# Patient Record
Sex: Female | Born: 1955 | Race: White | Hispanic: No | Marital: Married | Smoking: Former smoker
Health system: Southern US, Community
[De-identification: ages and names within clinical notes are randomized; demographics above are authoritative.]

## PROBLEM LIST (undated history)

## (undated) DIAGNOSIS — I639 Cerebral infarction, unspecified: Secondary | ICD-10-CM

## (undated) DIAGNOSIS — M810 Age-related osteoporosis without current pathological fracture: Secondary | ICD-10-CM

## (undated) DIAGNOSIS — G4733 Obstructive sleep apnea (adult) (pediatric): Secondary | ICD-10-CM

## (undated) DIAGNOSIS — E785 Hyperlipidemia, unspecified: Secondary | ICD-10-CM

## (undated) DIAGNOSIS — E039 Hypothyroidism, unspecified: Secondary | ICD-10-CM

## (undated) DIAGNOSIS — F419 Anxiety disorder, unspecified: Secondary | ICD-10-CM

## (undated) DIAGNOSIS — H269 Unspecified cataract: Secondary | ICD-10-CM

## (undated) DIAGNOSIS — G4730 Sleep apnea, unspecified: Secondary | ICD-10-CM

## (undated) DIAGNOSIS — E079 Disorder of thyroid, unspecified: Secondary | ICD-10-CM

## (undated) DIAGNOSIS — F32A Depression, unspecified: Secondary | ICD-10-CM

## (undated) DIAGNOSIS — K219 Gastro-esophageal reflux disease without esophagitis: Secondary | ICD-10-CM

## (undated) DIAGNOSIS — I10 Essential (primary) hypertension: Secondary | ICD-10-CM

## (undated) DIAGNOSIS — R413 Other amnesia: Secondary | ICD-10-CM

## (undated) DIAGNOSIS — C50919 Malignant neoplasm of unspecified site of unspecified female breast: Secondary | ICD-10-CM

## (undated) DIAGNOSIS — T7840XA Allergy, unspecified, initial encounter: Secondary | ICD-10-CM

## (undated) HISTORY — DX: Depression, unspecified: F32.A

## (undated) HISTORY — PX: ABDOMINAL HYSTERECTOMY: SHX81

## (undated) HISTORY — DX: Sleep apnea, unspecified: G47.30

## (undated) HISTORY — DX: Obstructive sleep apnea (adult) (pediatric): G47.33

## (undated) HISTORY — PX: WISDOM TOOTH EXTRACTION: SHX21

## (undated) HISTORY — PX: TUBAL LIGATION: SHX77

## (undated) HISTORY — DX: Essential (primary) hypertension: I10

## (undated) HISTORY — DX: Cerebral infarction, unspecified: I63.9

## (undated) HISTORY — PX: BREAST BIOPSY: SHX20

## (undated) HISTORY — PX: THYROIDECTOMY: SHX17

## (undated) HISTORY — PX: BREAST RECONSTRUCTION: SHX9

## (undated) HISTORY — DX: Hypothyroidism, unspecified: E03.9

## (undated) HISTORY — PX: FRACTURE SURGERY: SHX138

## (undated) HISTORY — PX: CHOLECYSTECTOMY: SHX55

## (undated) HISTORY — DX: Hyperlipidemia, unspecified: E78.5

## (undated) HISTORY — DX: Other amnesia: R41.3

## (undated) HISTORY — DX: Age-related osteoporosis without current pathological fracture: M81.0

## (undated) HISTORY — PX: EYE SURGERY: SHX253

## (undated) HISTORY — PX: COLONOSCOPY W/ POLYPECTOMY: SHX1380

## (undated) HISTORY — PX: MASTECTOMY: SHX3

## (undated) HISTORY — DX: Malignant neoplasm of unspecified site of unspecified female breast: C50.919

## (undated) HISTORY — DX: Gastro-esophageal reflux disease without esophagitis: K21.9

## (undated) HISTORY — DX: Anxiety disorder, unspecified: F41.9

## (undated) HISTORY — PX: ANKLE FRACTURE SURGERY: SHX122

## (undated) HISTORY — PX: LYMPH NODE BIOPSY: SHX201

## (undated) HISTORY — PX: CATARACT EXTRACTION: SUR2

## (undated) HISTORY — DX: Allergy, unspecified, initial encounter: T78.40XA

## (undated) HISTORY — DX: Disorder of thyroid, unspecified: E07.9

## (undated) HISTORY — DX: Unspecified cataract: H26.9

---

## 1999-02-14 ENCOUNTER — Inpatient Hospital Stay (HOSPITAL_COMMUNITY): Admission: RE | Admit: 1999-02-14 | Discharge: 1999-02-16 | Payer: Self-pay | Admitting: Obstetrics & Gynecology

## 1999-04-05 ENCOUNTER — Ambulatory Visit (HOSPITAL_COMMUNITY): Admission: RE | Admit: 1999-04-05 | Discharge: 1999-04-05 | Payer: Self-pay | Admitting: Obstetrics & Gynecology

## 1999-04-05 ENCOUNTER — Encounter: Payer: Self-pay | Admitting: Obstetrics & Gynecology

## 1999-06-14 ENCOUNTER — Other Ambulatory Visit: Admission: RE | Admit: 1999-06-14 | Discharge: 1999-06-14 | Payer: Self-pay | Admitting: Gastroenterology

## 1999-06-14 ENCOUNTER — Encounter (INDEPENDENT_AMBULATORY_CARE_PROVIDER_SITE_OTHER): Payer: Self-pay

## 1999-07-02 ENCOUNTER — Other Ambulatory Visit: Admission: RE | Admit: 1999-07-02 | Discharge: 1999-07-02 | Payer: Self-pay | Admitting: Obstetrics & Gynecology

## 1999-08-16 ENCOUNTER — Ambulatory Visit (HOSPITAL_COMMUNITY): Admission: RE | Admit: 1999-08-16 | Discharge: 1999-08-16 | Payer: Self-pay | Admitting: Obstetrics & Gynecology

## 1999-08-16 ENCOUNTER — Encounter: Payer: Self-pay | Admitting: Obstetrics & Gynecology

## 1999-08-21 ENCOUNTER — Encounter: Payer: Self-pay | Admitting: Obstetrics & Gynecology

## 1999-08-21 ENCOUNTER — Ambulatory Visit (HOSPITAL_COMMUNITY): Admission: RE | Admit: 1999-08-21 | Discharge: 1999-08-21 | Payer: Self-pay | Admitting: Obstetrics & Gynecology

## 2000-04-08 ENCOUNTER — Ambulatory Visit (HOSPITAL_COMMUNITY): Admission: RE | Admit: 2000-04-08 | Discharge: 2000-04-08 | Payer: Self-pay | Admitting: *Deleted

## 2000-04-16 ENCOUNTER — Other Ambulatory Visit: Admission: RE | Admit: 2000-04-16 | Discharge: 2000-04-16 | Payer: Self-pay | Admitting: Obstetrics & Gynecology

## 2000-10-19 ENCOUNTER — Ambulatory Visit (HOSPITAL_COMMUNITY): Admission: RE | Admit: 2000-10-19 | Discharge: 2000-10-19 | Payer: Self-pay | Admitting: Obstetrics & Gynecology

## 2000-10-19 ENCOUNTER — Encounter: Payer: Self-pay | Admitting: Obstetrics & Gynecology

## 2001-04-22 ENCOUNTER — Other Ambulatory Visit: Admission: RE | Admit: 2001-04-22 | Discharge: 2001-04-22 | Payer: Self-pay | Admitting: Obstetrics & Gynecology

## 2001-10-29 ENCOUNTER — Ambulatory Visit (HOSPITAL_COMMUNITY): Admission: RE | Admit: 2001-10-29 | Discharge: 2001-10-29 | Payer: Self-pay | Admitting: Obstetrics & Gynecology

## 2001-10-29 ENCOUNTER — Encounter: Payer: Self-pay | Admitting: Obstetrics & Gynecology

## 2001-12-07 ENCOUNTER — Ambulatory Visit (HOSPITAL_COMMUNITY): Admission: RE | Admit: 2001-12-07 | Discharge: 2001-12-07 | Payer: Self-pay

## 2001-12-07 ENCOUNTER — Encounter (INDEPENDENT_AMBULATORY_CARE_PROVIDER_SITE_OTHER): Payer: Self-pay

## 2001-12-07 ENCOUNTER — Observation Stay (HOSPITAL_COMMUNITY): Admission: RE | Admit: 2001-12-07 | Discharge: 2001-12-07 | Payer: Self-pay

## 2002-06-21 ENCOUNTER — Other Ambulatory Visit: Admission: RE | Admit: 2002-06-21 | Discharge: 2002-06-21 | Payer: Self-pay | Admitting: Obstetrics & Gynecology

## 2002-08-11 ENCOUNTER — Encounter: Payer: Self-pay | Admitting: Obstetrics & Gynecology

## 2002-08-11 ENCOUNTER — Ambulatory Visit (HOSPITAL_COMMUNITY): Admission: RE | Admit: 2002-08-11 | Discharge: 2002-08-11 | Payer: Self-pay | Admitting: Obstetrics & Gynecology

## 2002-12-20 ENCOUNTER — Ambulatory Visit (HOSPITAL_COMMUNITY): Admission: RE | Admit: 2002-12-20 | Discharge: 2002-12-20 | Payer: Self-pay | Admitting: Obstetrics & Gynecology

## 2002-12-20 ENCOUNTER — Encounter: Payer: Self-pay | Admitting: Obstetrics & Gynecology

## 2003-07-26 ENCOUNTER — Other Ambulatory Visit: Admission: RE | Admit: 2003-07-26 | Discharge: 2003-07-26 | Payer: Self-pay | Admitting: Obstetrics & Gynecology

## 2003-12-26 ENCOUNTER — Ambulatory Visit (HOSPITAL_COMMUNITY): Admission: RE | Admit: 2003-12-26 | Discharge: 2003-12-26 | Payer: Self-pay | Admitting: Obstetrics and Gynecology

## 2004-09-11 ENCOUNTER — Other Ambulatory Visit: Admission: RE | Admit: 2004-09-11 | Discharge: 2004-09-11 | Payer: Self-pay | Admitting: Obstetrics & Gynecology

## 2005-02-25 ENCOUNTER — Ambulatory Visit (HOSPITAL_COMMUNITY): Admission: RE | Admit: 2005-02-25 | Discharge: 2005-02-25 | Payer: Self-pay | Admitting: Obstetrics & Gynecology

## 2005-03-05 ENCOUNTER — Encounter (INDEPENDENT_AMBULATORY_CARE_PROVIDER_SITE_OTHER): Payer: Self-pay | Admitting: Diagnostic Radiology

## 2005-03-05 ENCOUNTER — Encounter: Admission: RE | Admit: 2005-03-05 | Discharge: 2005-03-05 | Payer: Self-pay | Admitting: Obstetrics & Gynecology

## 2005-03-05 ENCOUNTER — Encounter (INDEPENDENT_AMBULATORY_CARE_PROVIDER_SITE_OTHER): Payer: Self-pay | Admitting: *Deleted

## 2005-03-06 ENCOUNTER — Encounter (INDEPENDENT_AMBULATORY_CARE_PROVIDER_SITE_OTHER): Payer: Self-pay | Admitting: *Deleted

## 2005-03-06 ENCOUNTER — Encounter: Admission: RE | Admit: 2005-03-06 | Discharge: 2005-03-06 | Payer: Self-pay | Admitting: Obstetrics & Gynecology

## 2005-03-15 ENCOUNTER — Encounter: Admission: RE | Admit: 2005-03-15 | Discharge: 2005-03-15 | Payer: Self-pay

## 2005-03-17 ENCOUNTER — Encounter: Admission: RE | Admit: 2005-03-17 | Discharge: 2005-03-17 | Payer: Self-pay

## 2005-03-20 ENCOUNTER — Ambulatory Visit (HOSPITAL_BASED_OUTPATIENT_CLINIC_OR_DEPARTMENT_OTHER): Admission: RE | Admit: 2005-03-20 | Discharge: 2005-03-20 | Payer: Self-pay

## 2005-03-20 ENCOUNTER — Encounter (INDEPENDENT_AMBULATORY_CARE_PROVIDER_SITE_OTHER): Payer: Self-pay

## 2005-03-20 ENCOUNTER — Encounter (INDEPENDENT_AMBULATORY_CARE_PROVIDER_SITE_OTHER): Payer: Self-pay | Admitting: Specialist

## 2005-03-20 ENCOUNTER — Encounter: Admission: RE | Admit: 2005-03-20 | Discharge: 2005-03-20 | Payer: Self-pay

## 2005-03-20 ENCOUNTER — Ambulatory Visit (HOSPITAL_COMMUNITY): Admission: RE | Admit: 2005-03-20 | Discharge: 2005-03-20 | Payer: Self-pay

## 2005-04-01 ENCOUNTER — Ambulatory Visit: Payer: Self-pay | Admitting: Oncology

## 2005-04-08 ENCOUNTER — Ambulatory Visit: Admission: RE | Admit: 2005-04-08 | Discharge: 2005-06-20 | Payer: Self-pay | Admitting: Radiation Oncology

## 2005-05-29 ENCOUNTER — Ambulatory Visit (HOSPITAL_COMMUNITY): Admission: RE | Admit: 2005-05-29 | Discharge: 2005-05-29 | Payer: Self-pay | Admitting: Radiation Oncology

## 2005-07-22 ENCOUNTER — Ambulatory Visit: Payer: Self-pay | Admitting: Gastroenterology

## 2005-07-29 ENCOUNTER — Ambulatory Visit (HOSPITAL_COMMUNITY): Admission: RE | Admit: 2005-07-29 | Discharge: 2005-07-29 | Payer: Self-pay | Admitting: Gastroenterology

## 2005-07-30 ENCOUNTER — Ambulatory Visit: Payer: Self-pay | Admitting: Oncology

## 2005-08-07 ENCOUNTER — Ambulatory Visit: Payer: Self-pay | Admitting: Gastroenterology

## 2005-09-30 ENCOUNTER — Ambulatory Visit: Payer: Self-pay | Admitting: Oncology

## 2005-10-21 ENCOUNTER — Other Ambulatory Visit: Admission: RE | Admit: 2005-10-21 | Discharge: 2005-10-21 | Payer: Self-pay | Admitting: Obstetrics & Gynecology

## 2005-10-28 ENCOUNTER — Ambulatory Visit: Payer: Self-pay | Admitting: Gastroenterology

## 2006-01-03 ENCOUNTER — Encounter: Admission: RE | Admit: 2006-01-03 | Discharge: 2006-01-03 | Payer: Self-pay | Admitting: Obstetrics & Gynecology

## 2006-01-05 ENCOUNTER — Encounter: Admission: RE | Admit: 2006-01-05 | Discharge: 2006-01-05 | Payer: Self-pay | Admitting: Plastic Surgery

## 2006-07-10 ENCOUNTER — Ambulatory Visit: Payer: Self-pay | Admitting: Oncology

## 2006-11-19 ENCOUNTER — Encounter: Payer: Self-pay | Admitting: Vascular Surgery

## 2006-11-19 ENCOUNTER — Ambulatory Visit: Admission: RE | Admit: 2006-11-19 | Discharge: 2006-11-19 | Payer: Self-pay | Admitting: *Deleted

## 2006-11-19 ENCOUNTER — Ambulatory Visit: Payer: Self-pay | Admitting: Vascular Surgery

## 2007-01-07 ENCOUNTER — Encounter: Admission: RE | Admit: 2007-01-07 | Discharge: 2007-01-07 | Payer: Self-pay | Admitting: Obstetrics & Gynecology

## 2008-02-03 ENCOUNTER — Encounter: Admission: RE | Admit: 2008-02-03 | Discharge: 2008-02-03 | Payer: Self-pay | Admitting: Obstetrics & Gynecology

## 2010-10-13 ENCOUNTER — Encounter: Payer: Self-pay | Admitting: Obstetrics & Gynecology

## 2011-02-07 NOTE — Op Note (Signed)
[facility]  Patient:    Haley Beasley, Haley Beasley Visit Number: [id number] MRN: [id number]          Service Type: SUR Location: 4W [id number] Attending Physician:  Meredith Leeds Dictated by:   Zigmund Daniel, M.D. Proc. Date: 12/07/01 Admit Date:  12/07/2001                             Operative Report  PREOPERATIVE DIAGNOSIS:  Subacute cholecystitis and cholelithiasis.  POSTOPERATIVE DIAGNOSIS:  Subacute cholecystitis and cholelithiasis.  OPERATION PERFORMED:  Laparoscopic cholecystectomy.  SURGEON:  Zigmund Daniel, M.D.  ANESTHESIA:  General.  DESCRIPTION OF PROCEDURE:  After the patient was monitored and anesthetized and had routine preparation and draping of the abdomen, I infused local anesthetic inferior to the umbilicus and made a transverse incision at the site of a previous laparoscopy incision and dissected down through the scar and incised the fascia longitudinally, opened the peritoneum bluntly put in a 0 Vicryl pursestring suture in the fascia and secured a Hasson cannula.  After inflating the abdomen with CO2 I examined the peritoneal contents and saw some scars in the lower abdomen but otherwise, no abnormalities except for a distended slightly edematous gallbladder consistent with the patients clinical history.  After anesthetizing the sites, I placed a 10 mm epigastric port and two 5 mm right subcostal ports, then positioned the patient head up, foot down and tilted to the left and grasped the fundus of the gallbladder and elevated it toward the right shoulder.  There were some adhesions of the duodenum and omentum to the undersurface of the gallbladder and I carefully took those down.  I then retracted the infundibulum of the right and dissected out the cystic duct and clearly saw it emerging from the infundibulum of the gallbladder and joining the common bile duct.  I clipped the cystic duct with four clips and cut between  the two which were closest to the gallbladder. Then I dissected out the cystic artery, clipped it with three clips and cut between the two closest to the gallbladder.  I then used the cautery to dissect the gallbladder away from the liver and get hemostasis in the gallbladder fossa.  After  I had it almost completely detached, I irrigated the area.  I removed the irrigant and assured that the clips were secure and that hemostasis was good.  I then detached the gallbladder from the liver and removed it through the umbilical incision and tied the pursestring suture.  I then irrigated the right upper quadrant again until it was clear, removing the irrigant as far as possible.  I removed the lateral ports under direct vision, then allowed the CO2 to escape and removed the epigastric port.  I closed all the skin incisions with intracuticular 4-0 Vicryl and Steri-Strips and applied bandages.  Sponge, needle and instrument counts were correct.  The patient was stable throughout the operation. Dictated by:   Zigmund Daniel, M.D. Attending Physician:  Meredith Leeds DD:  12/07/01 TD:  12/07/01 Job: [id number]

## 2011-02-07 NOTE — Op Note (Signed)
Haley Beasley, Haley Beasley NO.:  [id number]   MEDICAL RECORD NO.:  [id number]          PATIENT TYPE:  AMB   LOCATION:  DSC                          FACILITY:  [facility]   PHYSICIAN:  Lorre Munroe., M.D.DATE OF BIRTH:  [birth year]   DATE OF PROCEDURE:  03/20/2005  DATE OF DISCHARGE:                                 OPERATIVE REPORT   PRE AND POSTOPERATIVE DIAGNOSIS:  Carcinoma of the left breast.   OPERATION:  Left partial mastectomy with sentinel lymph node biopsy after  injection of Lymphazurin blue dye.   SURGEON:  Lebron Conners, M.D.   ANESTHESIA:  General.   PROCEDURE:  After the patient was monitored and anesthetized, injected about  3 mL of Lymphazurin blue in the dermis and other subareolar tissues of the  left breast.  That was massaged for several minutes. After routine  preparation and draping of the left breast and axilla I used the radiation  detector and found a hot spot in the left axilla consistent with a sentinel  lymph node. I made a small incision right over that and dissected toward it  and found that there was indeed a highly radioactive blue stained lymph node  present. After excision of that node, it was confirmed to have increased  radioactivity in it and the remainder of the tissue and the axilla had  background level of activity. I closed that incision with intracuticular 4-0  Vicryl and Steri-Strips. The report the Touch Preps of the lymph node were  that they were negative for cancer. I then turned attention to the breast  and made a circumareolar incision paralleling the areolar border at the site  of the wire which was used to mark the superficial limit of the bracketing  wires for the lumpectomy. Dissecting in all directions and out toward the  wire which marked the deep extent, I took that generous resection in all  directions with my dissection being just under the areola and skin of the  breast and then just at or superficial to and  in some cases including the  pectoral fascia with 3 to 4 cm of tissue excised in the cephalad and caudad  directions as well. After removal of that specimen. I sent it for an x-ray  and it was there was some difficulty because of the superficial wire had  come out but the calcifications which were in question were present and the  marker which had been used at the time of biopsy was present as well. Touch  Preps were obtained on that specimen and were negative as well. I did see a  little bit of slightly suspicious tissue anteriorly and cephalad and I  excised that and sent it as a specimen as well. The wound hemostasis was  good after cautery and irrigation. I closed the skin with intracuticular 4-0  Vicryl and Steri-Strips and applied bandages. She went to PACU in stable  condition.       WB/MEDQ  D:  03/20/2005  T:  03/20/2005  Job:  [id number]

## 2020-01-21 HISTORY — PX: ENDOVENOUS ABLATION SAPHENOUS VEIN W/ LASER: SUR449

## 2020-05-23 HISTORY — PX: ENDOVENOUS ABLATION SAPHENOUS VEIN W/ LASER: SUR449

## 2021-10-22 DIAGNOSIS — E039 Hypothyroidism, unspecified: Secondary | ICD-10-CM | POA: Diagnosis not present

## 2021-10-22 DIAGNOSIS — I10 Essential (primary) hypertension: Secondary | ICD-10-CM | POA: Diagnosis not present

## 2021-10-22 DIAGNOSIS — M8580 Other specified disorders of bone density and structure, unspecified site: Secondary | ICD-10-CM | POA: Diagnosis not present

## 2021-10-22 DIAGNOSIS — G4730 Sleep apnea, unspecified: Secondary | ICD-10-CM | POA: Diagnosis not present

## 2021-10-22 DIAGNOSIS — K219 Gastro-esophageal reflux disease without esophagitis: Secondary | ICD-10-CM | POA: Diagnosis not present

## 2021-10-22 DIAGNOSIS — E782 Mixed hyperlipidemia: Secondary | ICD-10-CM | POA: Diagnosis not present

## 2021-10-22 DIAGNOSIS — Z131 Encounter for screening for diabetes mellitus: Secondary | ICD-10-CM | POA: Diagnosis not present

## 2021-10-22 DIAGNOSIS — Z1321 Encounter for screening for nutritional disorder: Secondary | ICD-10-CM | POA: Diagnosis not present

## 2021-10-22 DIAGNOSIS — Z6828 Body mass index (BMI) 28.0-28.9, adult: Secondary | ICD-10-CM | POA: Diagnosis not present

## 2021-10-22 DIAGNOSIS — Z1331 Encounter for screening for depression: Secondary | ICD-10-CM | POA: Diagnosis not present

## 2021-10-22 DIAGNOSIS — Z0000 Encounter for general adult medical examination without abnormal findings: Secondary | ICD-10-CM | POA: Diagnosis not present

## 2021-10-23 DIAGNOSIS — Z9889 Other specified postprocedural states: Secondary | ICD-10-CM | POA: Diagnosis not present

## 2021-10-23 DIAGNOSIS — Z853 Personal history of malignant neoplasm of breast: Secondary | ICD-10-CM | POA: Diagnosis not present

## 2021-10-30 DIAGNOSIS — E039 Hypothyroidism, unspecified: Secondary | ICD-10-CM | POA: Diagnosis not present

## 2021-11-07 DIAGNOSIS — J310 Chronic rhinitis: Secondary | ICD-10-CM | POA: Diagnosis not present

## 2021-11-07 DIAGNOSIS — H6123 Impacted cerumen, bilateral: Secondary | ICD-10-CM | POA: Diagnosis not present

## 2021-11-12 DIAGNOSIS — R0600 Dyspnea, unspecified: Secondary | ICD-10-CM | POA: Diagnosis not present

## 2021-11-12 DIAGNOSIS — G4733 Obstructive sleep apnea (adult) (pediatric): Secondary | ICD-10-CM | POA: Diagnosis not present

## 2021-11-12 DIAGNOSIS — Z87891 Personal history of nicotine dependence: Secondary | ICD-10-CM | POA: Diagnosis not present

## 2021-12-10 DIAGNOSIS — R0600 Dyspnea, unspecified: Secondary | ICD-10-CM | POA: Diagnosis not present

## 2021-12-10 DIAGNOSIS — J019 Acute sinusitis, unspecified: Secondary | ICD-10-CM | POA: Diagnosis not present

## 2021-12-10 DIAGNOSIS — G4733 Obstructive sleep apnea (adult) (pediatric): Secondary | ICD-10-CM | POA: Diagnosis not present

## 2021-12-10 DIAGNOSIS — Z87891 Personal history of nicotine dependence: Secondary | ICD-10-CM | POA: Diagnosis not present

## 2021-12-13 ENCOUNTER — Ambulatory Visit (INDEPENDENT_AMBULATORY_CARE_PROVIDER_SITE_OTHER): Payer: Medicare HMO | Admitting: Gastroenterology

## 2021-12-13 ENCOUNTER — Encounter: Payer: Self-pay | Admitting: Gastroenterology

## 2021-12-13 ENCOUNTER — Other Ambulatory Visit: Payer: Self-pay

## 2021-12-13 VITALS — BP 110/70 | HR 73 | Ht 68.0 in | Wt 192.0 lb

## 2021-12-13 DIAGNOSIS — R194 Change in bowel habit: Secondary | ICD-10-CM

## 2021-12-13 DIAGNOSIS — R1319 Other dysphagia: Secondary | ICD-10-CM | POA: Diagnosis not present

## 2021-12-13 DIAGNOSIS — Z860100 Personal history of colon polyps, unspecified: Secondary | ICD-10-CM

## 2021-12-13 DIAGNOSIS — K219 Gastro-esophageal reflux disease without esophagitis: Secondary | ICD-10-CM | POA: Diagnosis not present

## 2021-12-13 DIAGNOSIS — Z800 Family history of malignant neoplasm of digestive organs: Secondary | ICD-10-CM | POA: Diagnosis not present

## 2021-12-13 MED ORDER — PREDNISONE 10 MG PO TABS: 10.0000 mg | ORAL_TABLET | ORAL | 0 refills | Status: DC

## 2021-12-13 MED ORDER — CLENPIQ 10-3.5-12 MG-GM -GM/160ML PO SOLN
1.0000 | Freq: Once | ORAL | 0 refills | Status: AC
Start: 2021-12-13 — End: 2021-12-13

## 2021-12-13 NOTE — Progress Notes (Signed)
? ? ?          ?Chief Complaint: Family history of colon cancer, colon cancer screening, GERD, family history of esophageal cancer ? ?Referring Provider:     Jaclynn Major, NP ? ? ?HPI:   ? ? ?Haley Beasley is a 66 y.o. female with a history of hypothyroidism, HTN, HLD, GERD, depression, history of breast cancer 2006 s/p lumpectomy and radiation then recurrence in 2009 s/p bilateral mastectomy and chemotherapy in 2009, osteopenia, OSA (on CPAP), referred to the Gastroenterology Clinic for evaluation of ongoing colon cancer screening and evaluation of GERD. ? ?Long standing hx of GERD with index sxs of HB, regurgitation. Occasional dysphagia, poiting to suprasternal notch. Reflux incompletely controlled with pantoprazole 40 mg/day. Will have breakthrough, which she treats with Tums or extra Protonix prn a few days/week. Regurgitation with forward flexion. Last EGD ~2008.  ? ?Last colonoscopy was 2017 in [facility] and n/f polyps.  No record available for review, but was recommended to repeat in 5 years. Has had polyps in the past. No current LGI sxs.  ? ?As above, personal history of breast cancer and significant family history of malignancy as outlined below.  She has never had genetic testing. ? ?Family history notable for the following: ?- Mother diagnosed with colon cancer at age 73 ?- Brother diagnosed with esophageal cancer in his 41s, along with RCC ?- Brother with Pancreatic CA, deceased ~71 yo ?- Brothers x4 with Lung CA ?- 16 total siblings ? ?Reviewed most recent labs from [facility] dated 10/22/2021: ?- Normal CBC with H/H 12.6/36.9 ?- Normal CMP ?- TSH 0.27 ?- Vitamin D 22.3 ?- A1c 5.9% ? ?Endoscopic History: ?- EGD (05/1999): Scattered gastritis, mild duodenitis, hiatal hernia ?- Colonoscopy (05/1999): Normal ?- EGD (07/2003): Irregular Z-line with some nodularity (path: Reflux changes without intestinal metaplasia), Hiatal hernia ?- Colonoscopy (07/2003): Normal ?- EGD ~2008 in [place] ?- Last colonoscopy 2017  in [place] and notable for polyps ? ? ?Past Medical History:  ?Diagnosis Date  ? Breast cancer ([place])   ? GERD (gastroesophageal reflux disease)   ? Hypertension   ? Thyroid disease   ? ? ? ?Past Surgical History:  ?Procedure Laterality Date  ? ABDOMINAL HYSTERECTOMY    ? CHOLECYSTECTOMY    ? MASTECTOMY Bilateral   ? ?Family History  ?Problem Relation Age of Onset  ? Colon cancer Mother   ? Esophageal cancer Brother   ? Renal cancer Brother   ? Brain cancer Maternal Uncle   ? Renal cancer Paternal Uncle   ? ?  ?Current Outpatient Medications  ?Medication Sig Dispense Refill  ? ezetimibe (ZETIA) 10 MG tablet ezetimibe 10 mg tablet    ? fluticasone (FLONASE) 50 MCG/ACT nasal spray     ? ketoconazole (NIZORAL) 2 % cream ketoconazole 2 % topical cream    ? losartan (COZAAR) 25 MG tablet losartan 25 mg tablet    ? pantoprazole (PROTONIX) 40 MG tablet pantoprazole 40 mg tablet,delayed release    ? predniSONE (DELTASONE) 10 MG tablet Take 1 tablet (10 mg total) by mouth as directed. We have sent the following medications to your pharmacy for you to pick up at your convenience: ?Prednisone '10mg'$  tablet ?'40mg'$  by mouth x 2 week. ?'30mg'$  by mouth x 1 week. ?'20mg'$  by mouth x 1 weeks. ?'10mg'$  by mouth x 1 weeks. 100 tablet 0  ? SYNTHROID 50 MCG tablet     ? ?No current facility-administered medications for this visit.  ? ?Allergies  ?Allergen Reactions  ?  Fluogen [Influenza Virus Vaccine]   ? Penicillins   ? ? ? ?Review of Systems: ?All systems reviewed and negative except where noted in HPI.  ? ? ? ?Physical Exam:   ? ?Wt Readings from Last 3 Encounters:  ?12/13/21 192 lb (87.1 kg)  ? ? ?BP 110/70   Pulse 73   Ht '5\' 8"'$  (1.727 m)   Wt 192 lb (87.1 kg)   BMI 29.19 kg/m?  ?Constitutional:  Pleasant, in no acute distress. ?Psychiatric: Normal mood and affect. Behavior is normal. ?Pain cardiovascular: Normal rate, regular rhythm. No edema ?Pulmonary/chest: Effort normal and breath sounds normal. No wheezing, rales or  rhonchi. ?Abdominal: Soft, nondistended, nontender. Bowel sounds active throughout. There are no masses palpable. No hepatomegaly. ?Neurological: Alert and oriented to person place and time. ?Skin: Skin is warm and dry. No rashes noted. ? ? ?ASSESSMENT AND PLAN;  ? ?1) GERD ?- EGD to evaluate for erosive esophagitis, LES laxity, hiatal hernia ?- Continue pantoprazole for now, with low threshold to change therapy based on endoscopic findings ?- Continue antireflux lifestyle/dietary modifications ?- Barrett's esophagus screening given longstanding history of GERD and family history of esophageal CA ? ?2) Dysphagia ?- Intermittent solid food dysphagia ?- Evaluate for etiology at time of EGD with biopsies and dilation as appropriate ? ?3) Family history of colon cancer ?4) Personal history of colon polyps ?- Colonoscopy for ongoing polyp surveillance ? ?5) Personal history of breast cancer ?6) Family history of esophageal cancer ?7) Family history of pancreatic cancer ?8) Family history of RCC ?- Referral to the genetics clinic ?- Did discuss Pancreatic Cancer screening protocol depending on results from genetic testing ?- Evaluate for Barrett's Esophagus with EGD as above ? ?9) Alternating bowel habits ?- Longstanding history of alternating loose stools and constipation ?- Evaluate for etiology at time of upper endoscopy and colonoscopy as above ?- Extended 2-day prep with Dulcolax, clears, followed by prep ?- Would benefit from fiber supplement ?- Additional recommendations pending endoscopic findings ? ?The indications, risks, and benefits of EGD and colonoscopy were explained to the patient in detail. Risks include but are not limited to bleeding, perforation, adverse reaction to medications, and cardiopulmonary compromise. Sequelae include but are not limited to the possibility of surgery, hospitalization, and mortality. The patient verbalized understanding and wished to proceed. All questions answered, referred to  scheduler and bowel prep ordered. Further recommendations pending results of the exam.  ? ? ? ?Lavena Bullion, DO, FACG  12/13/2021, 3:59 PM ? ? ?Jaclynn Major, NP ? ?

## 2021-12-13 NOTE — Progress Notes (Signed)
ferr

## 2021-12-13 NOTE — Patient Instructions (Addendum)
If you are age 67 or older, your body mass index should be between 23-30. Your Body mass index is 29.19 kg/m?Marland Kitchen If this is out of the aforementioned range listed, please consider follow up with your Primary Care Provider. ? ?If you are age 32 or younger, your body mass index should be between 19-25. Your Body mass index is 29.19 kg/m?Marland Kitchen If this is out of the aformentioned range listed, please consider follow up with your Primary Care Provider.  ? ?________________________________________________________ ? ?The [facility] providers would like to encourage you to use [facility] to communicate with providers for non-urgent requests or questions.  Due to long hold times on the telephone, sending your provider a message by [facility] may be a faster and more efficient way to get a response.  Please allow 48 business hours for a response.  Please remember that this is for non-urgent requests.  ?_______________________________________________________ ? ?You have been scheduled for a colonoscopy. Please follow written instructions given to you at your visit today.  ?Please pick up your prep supplies at the pharmacy within the next 1-3 days. ?If you use inhalers (even only as needed), please bring them with you on the day of your procedure. ? ?We have sent the following medications to your pharmacy for you to pick up at your convenience: ?Clenpiq ? ?A referral has been sent to Genetics. Please call in 2 weeks if you haven't heard anything. ? ?It was a pleasure to see you today! ? ?Gerrit Heck, D.O. ? ? ? ? ? ? ? ?We want to thank you for trusting [facility] with your care. All of our staff and providers value the relationships we have built with our patients, and it is an honor to care for you.  ? ?We are writing to let you know that [facility] will close on Feb 03, 2022, and we invite you to continue to see Dr. Carmell Austria and Gerrit Heck at the [facility] [place]  office location. We are consolidating our serices at these [facility] practices to better provide care. Our office staff will work with you to ensure a seamless transition.  ? ?Gerrit Heck, DO -Dr. Bryan Lemma will be movig to [facility] at [address] [place], [place] [address], effective Feb 03, 2022.  Contact [phone] to schedule an appointment with him.  ? ?Carmell Austria, MD- Dr. Lyndel Safe will be movig to [facility] at [address] [place], [place] [address], effective Feb 03, 2022.  Contact [phone] to schedule an appointment with him.  ? ?Requesting Medical Records ?If you need to request your medical records, please follow the instructions below. Your medical records are confidential, and a copy can be transferred to another provider or released to you or another person you designate only with your permission. ? ?There are several ways to request your medical records: ?Requests for medical records can be submitted through our practice.   ?You can also request your records electronically, in your MyChart account by selecting the ?Request Health Records? tab.  ?If you need additional information on how to request records, please go to [email or web address]/, choose Patient Information, then select Request Medical Records. ?To make an appointment or if you have any questions about your health care needs, please contact our office at [phone] and one of our staff members will be glad to assist you. ?[facility] is committed to providing exceptional care for you and our community. Thank you for allowing [place] to  serve your health care needs. ?Sincerely, ? ?Windy Canny, Director [facility] ?[facility] also offers convenient virtual care options. Sore throat? Sinus problems? Cold or flu symptoms? Get care from the comfort of home with [facility] Video Visits and e-Visits. Learn more about the non-emergency conditions treated and start your virtual visit at  [email or web address] ? ?

## 2021-12-16 ENCOUNTER — Telehealth: Payer: Self-pay | Admitting: Licensed Clinical Social Worker

## 2021-12-16 ENCOUNTER — Encounter: Payer: Self-pay | Admitting: Gastroenterology

## 2021-12-16 NOTE — Telephone Encounter (Signed)
Scheduled appt per 3/24 referral. Pt is aware of appt date and time. Pt is aware to arrive 15 mins prior to appt time and to bring and updated insurance card. Pt is aware of appt location.   ?

## 2021-12-24 DIAGNOSIS — E039 Hypothyroidism, unspecified: Secondary | ICD-10-CM | POA: Diagnosis not present

## 2021-12-25 DIAGNOSIS — R0600 Dyspnea, unspecified: Secondary | ICD-10-CM | POA: Diagnosis not present

## 2021-12-25 DIAGNOSIS — G4733 Obstructive sleep apnea (adult) (pediatric): Secondary | ICD-10-CM | POA: Diagnosis not present

## 2021-12-25 DIAGNOSIS — J301 Allergic rhinitis due to pollen: Secondary | ICD-10-CM | POA: Diagnosis not present

## 2021-12-25 DIAGNOSIS — Z87891 Personal history of nicotine dependence: Secondary | ICD-10-CM | POA: Diagnosis not present

## 2022-01-09 ENCOUNTER — Encounter: Payer: PRIVATE HEALTH INSURANCE | Admitting: Gastroenterology

## 2022-01-14 ENCOUNTER — Encounter: Payer: Self-pay | Admitting: Gastroenterology

## 2022-01-14 ENCOUNTER — Ambulatory Visit (AMBULATORY_SURGERY_CENTER): Payer: Medicare HMO | Admitting: Gastroenterology

## 2022-01-14 VITALS — BP 138/62 | HR 59 | Temp 97.3°F | Resp 14 | Ht 68.0 in | Wt 192.0 lb

## 2022-01-14 DIAGNOSIS — K317 Polyp of stomach and duodenum: Secondary | ICD-10-CM | POA: Diagnosis not present

## 2022-01-14 DIAGNOSIS — Z86010 Personal history of colonic polyps: Secondary | ICD-10-CM

## 2022-01-14 DIAGNOSIS — K219 Gastro-esophageal reflux disease without esophagitis: Secondary | ICD-10-CM

## 2022-01-14 DIAGNOSIS — K5730 Diverticulosis of large intestine without perforation or abscess without bleeding: Secondary | ICD-10-CM

## 2022-01-14 DIAGNOSIS — K621 Rectal polyp: Secondary | ICD-10-CM | POA: Diagnosis not present

## 2022-01-14 DIAGNOSIS — Z09 Encounter for follow-up examination after completed treatment for conditions other than malignant neoplasm: Secondary | ICD-10-CM

## 2022-01-14 DIAGNOSIS — Z800 Family history of malignant neoplasm of digestive organs: Secondary | ICD-10-CM

## 2022-01-14 DIAGNOSIS — R1319 Other dysphagia: Secondary | ICD-10-CM | POA: Diagnosis not present

## 2022-01-14 MED ORDER — SODIUM CHLORIDE 0.9 % IV SOLN: 500.0000 mL | Freq: Once | INTRAVENOUS | Status: DC

## 2022-01-14 NOTE — Progress Notes (Signed)
Called to room to assist during endoscopic procedure.  Patient ID and intended procedure confirmed with present staff. Received instructions for my participation in the procedure from the performing physician.  

## 2022-01-14 NOTE — Op Note (Signed)
[facility] ?Patient Name: Haley Beasley ?Procedure Date: 01/14/2022 1:27 PM ?MRN: [id number] ?Endoscopist: Gerrit Heck , MD ?Age: 66 ?Referring MD:  ?Date of Birth: 1956-04-26 ?Gender: Female ?Account #: [id number] ?Procedure:                Upper GI endoscopy ?Indications:              Dysphagia, Heartburn, Esophageal reflux, Screening  ?                          for Barrett's esophagus ?Medicines:                Monitored Anesthesia Care ?Procedure:                Pre-Anesthesia Assessment: ?                          - Prior to the procedure, a History and Physical  ?                          was performed, and patient medications and  ?                          allergies were reviewed. The patient's tolerance of  ?                          previous anesthesia was also reviewed. The risks  ?                          and benefits of the procedure and the sedation  ?                          options and risks were discussed with the patient.  ?                          All questions were answered, and informed consent  ?                          was obtained. Prior Anticoagulants: The patient has  ?                          taken no previous anticoagulant or antiplatelet  ?                          agents. ASA Grade Assessment: II - A patient with  ?                          mild systemic disease. After reviewing the risks  ?                          and benefits, the patient was deemed in  ?                          satisfactory condition to undergo the procedure. ?  After obtaining informed consent, the endoscope was  ?                          passed under direct vision. Throughout the  ?                          procedure, the patient's blood pressure, pulse, and  ?                          oxygen saturations were monitored continuously. The  ?                          GIF [id number] #[id number] was introduced through the  ?                          mouth, and advanced to the second  part of duodenum.  ?                          The upper GI endoscopy was accomplished without  ?                          difficulty. The patient tolerated the procedure  ?                          well. ?Scope In: ?Scope Out: ?Findings:                 A single area of ectopic gastric mucosa was found  ?                          in the upper third of the esophagus. ?                          The examined esophagus was otherwise normal. Given  ?                          the symptoms of dysphagia, the decision was made to  ?                          perform esophageal dilation. The scope was  ?                          withdrawn. Dilation was performed with a Venia Minks  ?                          dilator with no resistance at 75 Fr. The dilation  ?                          site was examined following endoscope reinsertion  ?                          and showed no bleeding, mucosal tear or  ?  perforation. Estimated blood loss: none. ?                          The Z-line was regular and was found 38 cm from the  ?                          incisors. ?                          The gastroesophageal flap valve was visualized  ?                          endoscopically and classified as Hill Grade II  ?                          (fold present, opens with respiration). ?                          A few small sessile polyps with no bleeding and no  ?                          stigmata of recent bleeding were found in the  ?                          gastric fundus and in the gastric body. Several of  ?                          these polyps were removed with a cold biopsy  ?                          forceps for histologic representative evaluation.  ?                          Resection and retrieval were complete. Estimated  ?                          blood loss was minimal. ?                          The examined duodenum was normal. ?Complications:            No immediate complications. ?Estimated Blood  Loss:     Estimated blood loss was minimal. ?Impression:               - Ectopic gastric mucosa in the upper third of the  ?                          esophagus. ?                          - Normal esophagus. Dilated with 54 Fr Maloney. ?                          - Z-line regular, 38 cm from the incisors. ?                          -  Gastroesophageal flap valve classified as Hill  ?                          Grade II (fold present, opens with respiration). ?                          - A few gastric polyps. Resected and retrieved. ?                          - Normal examined duodenum. ?Recommendation:           - Patient has a contact number available for  ?                          emergencies. The signs and symptoms of potential  ?                          delayed complications were discussed with the  ?                          patient. Return to normal activities tomorrow.  ?                          Written discharge instructions were provided to the  ?                          patient. ?                          - Resume previous diet. ?                          - Continue present medications. ?                          - Await pathology results. ?                          - Repeat upper endoscopy PRN. ?                          - Perform a colonoscopy today. ?                          - Return to GI clinic PRN. ?Gerrit Heck, MD ?01/14/2022 2:06:15 PM ?

## 2022-01-14 NOTE — Progress Notes (Signed)
? ?GASTROENTEROLOGY PROCEDURE H&P NOTE  ? ?Primary Care Physician: ?Garwin Brothers, MD ? ? ? ?Reason for Procedure:   GERD, dysphagia, regurgitation, colon polyp surveillance, family hx of colon cancer, family history of esophageal cancer, family history of pancreatic cancer ? ?Plan:    EGD, colonoscopy ? ?Patient is appropriate for endoscopic procedure(s) in the ambulatory ([facility]) setting. ? ?The nature of the procedure, as well as the risks, benefits, and alternatives were carefully and thoroughly reviewed with the patient. Ample time for discussion and questions allowed. The patient understood, was satisfied, and agreed to proceed.  ? ? ? ?HPI: ?Haley Beasley is a 66 y.o. female who presents for EGD for evaluation of GERD, Barrett's Esophagus screening, regurgitation, dysphagia, and fhx of esophageal cancer, and colonoscopy for polyp surveillance and fhx of colon cancer.   ? ?Endoscopic History: ?- EGD (05/1999): Scattered gastritis, mild duodenitis, hiatal hernia ?- Colonoscopy (05/1999): Normal ?- EGD (07/2003): Irregular Z-line with some nodularity (path: Reflux changes without intestinal metaplasia), Hiatal hernia ?- Colonoscopy (07/2003): Normal ?- EGD ~2008 in [place] ?- Last colonoscopy 2017 in [place] and notable for polyps ? ?Past Medical History:  ?Diagnosis Date  ? Anxiety   ? Breast cancer ([facility])   ? Depression   ? GERD (gastroesophageal reflux disease)   ? Hypertension   ? Sleep apnea   ? Thyroid disease   ? ? ?Past Surgical History:  ?Procedure Laterality Date  ? ABDOMINAL HYSTERECTOMY    ? CHOLECYSTECTOMY    ? MASTECTOMY Bilateral   ? THYROIDECTOMY    ? ? ?Prior to Admission medications   ?Medication Sig Start Date End Date Taking? Authorizing Provider  ?ezetimibe (ZETIA) 10 MG tablet ezetimibe 10 mg tablet 08/20/21  Yes [provider]  ?fluticasone (FLONASE) 50 MCG/ACT nasal spray  11/07/21  Yes [provider]  ?losartan (COZAAR) 25 MG tablet losartan 25 mg tablet 08/20/21  Yes  [provider]  ?pantoprazole (PROTONIX) 40 MG tablet pantoprazole 40 mg tablet,delayed release 08/29/21  Yes [provider]  ?SYNTHROID 50 MCG tablet  11/01/21  Yes [provider]  ?clindamycin (CLEOCIN T) 1 % external solution clindamycin phosphate 1 % topical solution    [provider]  ?ketoconazole (NIZORAL) 2 % cream ketoconazole 2 % topical cream    [provider]  ? ? ?Current Outpatient Medications  ?Medication Sig Dispense Refill  ? ezetimibe (ZETIA) 10 MG tablet ezetimibe 10 mg tablet    ? fluticasone (FLONASE) 50 MCG/ACT nasal spray     ? losartan (COZAAR) 25 MG tablet losartan 25 mg tablet    ? pantoprazole (PROTONIX) 40 MG tablet pantoprazole 40 mg tablet,delayed release    ? SYNTHROID 50 MCG tablet     ? clindamycin (CLEOCIN T) 1 % external solution clindamycin phosphate 1 % topical solution    ? ketoconazole (NIZORAL) 2 % cream ketoconazole 2 % topical cream    ? ?Current Facility-Administered Medications  ?Medication Dose Route Frequency Provider Last Rate Last Admin  ? 0.9 %  sodium chloride infusion  500 mL Intravenous Once Detron Carras V, DO      ? ? ?Allergies as of 01/14/2022 - Review Complete 01/14/2022  ?Allergen Reaction Noted  ? Fluogen [influenza virus vaccine]  12/13/2021  ? Penicillins  12/13/2021  ? ? ?Family History  ?Problem Relation Age of Onset  ? Colon cancer Mother   ? Esophageal cancer Brother   ? Renal cancer Brother   ? Brain cancer Maternal Uncle   ?  Renal cancer Paternal Uncle   ? ? ?Social History  ? ?Socioeconomic History  ? Marital status: Married  ?  Spouse name: Not on file  ? Number of children: Not on file  ? Years of education: Not on file  ? Highest education level: Not on file  ?Occupational History  ? Not on file  ?Tobacco Use  ? Smoking status: Former  ?  Types: Cigarettes  ?  Quit date: 09/23/2007  ?  Years since quitting: 14.3  ? Smokeless tobacco: Never  ?Substance and Sexual Activity  ? Alcohol use: Not on file   ? Drug use: Not on file  ? Sexual activity: Not on file  ?Other Topics Concern  ? Not on file  ?Social History Narrative  ? Not on file  ? ?Social Determinants of Health  ? ?Financial Resource Strain: Not on file  ?Food Insecurity: Not on file  ?Transportation Needs: Not on file  ?Physical Activity: Not on file  ?Stress: Not on file  ?Social Connections: Not on file  ?Intimate Partner Violence: Not on file  ? ? ?Physical Exam: ?Vital signs in last 24 hours: ?'@BP'$  126/82 (BP Location: Right Arm, Patient Position: Sitting, Cuff Size: Normal)   Pulse 74   Temp (!) 97.3 ?F (36.3 ?C) (Temporal)   Ht '5\' 8"'$  (1.727 m)   Wt 192 lb (87.1 kg)   SpO2 98%   BMI 29.19 kg/m?  ?GEN: NAD ?EYE: Sclerae anicteric ?ENT: MMM ?CV: Non-tachycardic ?Pulm: CTA b/l ?GI: Soft, NT/ND ?NEURO:  Alert & Oriented x 3 ? ? ?Gerrit Heck, DO ?[facility] ? ? ?01/14/2022 1:15 PM ? ?

## 2022-01-14 NOTE — Patient Instructions (Signed)
YOU HAD AN ENDOSCOPIC PROCEDURE TODAY AT [facility]:   Refer to the procedure report that was given to you for any specific questions about what was found during the examination.  If the procedure report does not answer your questions, please call your gastroenterologist to clarify.  If you requested that your care partner not be given the details of your procedure findings, then the procedure report has been included in a sealed envelope for you to review at your convenience later. ? ?**Handouts given on polyps and diverticulosis** ? ?YOU SHOULD EXPECT: Some feelings of bloating in the abdomen. Passage of more gas than usual.  Walking can help get rid of the air that was put into your GI tract during the procedure and reduce the bloating. If you had a lower endoscopy (such as a colonoscopy or flexible sigmoidoscopy) you may notice spotting of blood in your stool or on the toilet paper. If you underwent a bowel prep for your procedure, you may not have a normal bowel movement for a few days. ? ?Please Note:  You might notice some irritation and congestion in your nose or some drainage.  This is from the oxygen used during your procedure.  There is no need for concern and it should clear up in a day or so. ? ?SYMPTOMS TO REPORT IMMEDIATELY: ? ?Following lower endoscopy (colonoscopy or flexible sigmoidoscopy): ? Excessive amounts of blood in the stool ? Significant tenderness or worsening of abdominal pains ? Swelling of the abdomen that is new, acute ? Fever of 100?F or higher ? ?Following upper endoscopy (EGD) ? Vomiting of blood or coffee ground material ? New chest pain or pain under the shoulder blades ? Painful or persistently difficult swallowing ? New shortness of breath ? Fever of 100?F or higher ? Black, tarry-looking stools ? ?For urgent or emergent issues, a gastroenterologist can be reached at any hour by calling [phone]. ?Do not use MyChart messaging for urgent concerns.   ? ? ?DIET:  We do recommend a small meal at first, but then you may proceed to your regular diet.  Drink plenty of fluids but you should avoid alcoholic beverages for 24 hours. ? ?ACTIVITY:  You should plan to take it easy for the rest of today and you should NOT DRIVE or use heavy machinery until tomorrow (because of the sedation medicines used during the test).   ? ?FOLLOW UP: ?Our staff will call the number listed on your records 48-72 hours following your procedure to check on you and address any questions or concerns that you may have regarding the information given to you following your procedure. If we do not reach you, we will leave a message.  We will attempt to reach you two times.  During this call, we will ask if you have developed any symptoms of COVID 19. If you develop any symptoms (ie: fever, flu-like symptoms, shortness of breath, cough etc.) before then, please call [phone].  If you test positive for Covid 19 in the 2 weeks post procedure, please call and report this information to [place].   ? ?If any biopsies were taken you will be contacted by phone or by letter within the next 1-3 weeks.  Please call us at [phone] if you have not heard about the biopsies in 3 weeks.  ? ? ?SIGNATURES/CONFIDENTIALITY: ?You and/or your care partner have signed paperwork which will be entered into your electronic medical record.  These signatures attest to the fact that  that the information above on your After Visit Summary has been reviewed and is understood.  Full responsibility of the confidentiality of this discharge information lies with you and/or your care-partner.  ?

## 2022-01-14 NOTE — Op Note (Signed)
[facility] ?Patient Name: Haley Beasley ?Procedure Date: 01/14/2022 1:26 PM ?MRN: [id number] ?Endoscopist: Gerrit Heck , MD ?Age: 66 ?Referring MD:  ?Date of Birth: [birth year] ?Gender: Female ?Account #: [id number] ?Procedure:                Colonoscopy ?Indications:              Screening in patient at increased risk: Colorectal  ?                          cancer in mother 56 or older (diagnosed age 17),  ?                          High risk colon cancer surveillance: Personal  ?                          history of colonic polyps on last colonoscopy in  ?                          2017 in [place]. ?Medicines:                Monitored Anesthesia Care ?Procedure:                Pre-Anesthesia Assessment: ?                          - Prior to the procedure, a History and Physical  ?                          was performed, and patient medications and  ?                          allergies were reviewed. The patient's tolerance of  ?                          previous anesthesia was also reviewed. The risks  ?                          and benefits of the procedure and the sedation  ?                          options and risks were discussed with the patient.  ?                          All questions were answered, and informed consent  ?                          was obtained. Prior Anticoagulants: The patient has  ?                          taken no previous anticoagulant or antiplatelet  ?                          agents. ASA Grade Assessment: II - A patient with  ?  mild systemic disease. After reviewing the risks  ?                          and benefits, the patient was deemed in  ?                          satisfactory condition to undergo the procedure. ?                          After obtaining informed consent, the colonoscope  ?                          was passed under direct vision. Throughout the  ?                          procedure, the patient's blood pressure, pulse, and   ?                          oxygen saturations were monitored continuously. The  ?                          Colonoscope was introduced through the anus and  ?                          advanced to the the cecum, identified by  ?                          appendiceal orifice and ileocecal valve. The  ?                          colonoscopy was performed without difficulty. The  ?                          patient tolerated the procedure well. The quality  ?                          of the bowel preparation was good. The ileocecal  ?                          valve, appendiceal orifice, and rectum were  ?                          photographed. ?Scope In: 1:38:09 PM ?Scope Out: 1:56:48 PM ?Scope Withdrawal Time: 0 hours 12 minutes 46 seconds  ?Total Procedure Duration: 0 hours 18 minutes 39 seconds  ?Findings:                 The perianal and digital rectal examinations were  ?                          normal. ?                          A 3 mm polyp was found in the rectum. The polyp was  ?  sessile. The polyp was removed with a cold snare.  ?                          Resection and retrieval were complete. Estimated  ?                          blood loss was minimal. ?                          Multiple small-mouthed diverticula were found in  ?                          the sigmoid colon. ?                          The exam was otherwise normal throughout the  ?                          remainder of the colon. ?                          The retroflexed view of the distal rectum and anal  ?                          verge was normal and showed no anal or rectal  ?                          abnormalities. ?Complications:            No immediate complications. ?Estimated Blood Loss:     Estimated blood loss was minimal. ?Impression:               - One 3 mm polyp in the rectum, removed with a cold  ?                          snare. Resected and retrieved. ?                          - Diverticulosis in the  sigmoid colon. ?                          - The distal rectum and anal verge are normal on  ?                          retroflexion view. ?Recommendation:           - Patient has a contact number available for  ?                          emergencies. The signs and symptoms of potential  ?                          delayed complications were discussed with the  ?                          patient. Return to normal activities tomorrow.  ?  Written discharge instructions were provided to the  ?                          patient. ?                          - Resume previous diet. ?                          - Continue present medications. ?                          - Await pathology results. ?                          - Repeat colonoscopy for surveillance based on  ?                          pathology results. ?                          - Return to GI office PRN. ?Gerrit Heck, MD ?01/14/2022 2:09:47 PM ?

## 2022-01-14 NOTE — Progress Notes (Signed)
Vitals-DT 

## 2022-01-14 NOTE — Progress Notes (Signed)
To Pacu, VSS. Report to [email or web address] 

## 2022-01-16 ENCOUNTER — Telehealth: Payer: Self-pay

## 2022-01-16 NOTE — Telephone Encounter (Signed)
?  Follow up Call- ? ? ?  01/14/2022  ? 12:45 PM  ?Call back number  ?Post procedure Call Back phone  # [phone]  ?Permission to leave phone message Yes  ?  ? ?Patient questions: ? ?Do you have a fever, pain , or abdominal swelling? No. ?Pain Score  0 * ? ?Have you tolerated food without any problems? Yes.   ? ?Have you been able to return to your normal activities? Yes.   ? ?Do you have any questions about your discharge instructions: ?Diet   No. ?Medications  No. ?Follow up visit  No. ? ?Do you have questions or concerns about your Care? No. ? ?Actions: ?* If pain score is 4 or above: ?No action needed, pain <4. ? ? ?

## 2022-01-20 ENCOUNTER — Encounter: Payer: Self-pay | Admitting: Licensed Clinical Social Worker

## 2022-01-20 ENCOUNTER — Other Ambulatory Visit: Payer: Self-pay

## 2022-01-20 ENCOUNTER — Inpatient Hospital Stay: Payer: PRIVATE HEALTH INSURANCE

## 2022-01-20 ENCOUNTER — Inpatient Hospital Stay: Payer: PRIVATE HEALTH INSURANCE | Attending: Genetic Counselor | Admitting: Licensed Clinical Social Worker

## 2022-01-20 DIAGNOSIS — I10 Essential (primary) hypertension: Secondary | ICD-10-CM | POA: Diagnosis not present

## 2022-01-20 DIAGNOSIS — F419 Anxiety disorder, unspecified: Secondary | ICD-10-CM | POA: Diagnosis not present

## 2022-01-20 DIAGNOSIS — Z803 Family history of malignant neoplasm of breast: Secondary | ICD-10-CM | POA: Diagnosis not present

## 2022-01-20 DIAGNOSIS — E039 Hypothyroidism, unspecified: Secondary | ICD-10-CM | POA: Diagnosis not present

## 2022-01-20 DIAGNOSIS — Z8051 Family history of malignant neoplasm of kidney: Secondary | ICD-10-CM

## 2022-01-20 DIAGNOSIS — R102 Pelvic and perineal pain: Secondary | ICD-10-CM | POA: Diagnosis not present

## 2022-01-20 DIAGNOSIS — Z789 Other specified health status: Secondary | ICD-10-CM | POA: Diagnosis not present

## 2022-01-20 DIAGNOSIS — M5442 Lumbago with sciatica, left side: Secondary | ICD-10-CM | POA: Diagnosis not present

## 2022-01-20 DIAGNOSIS — Z853 Personal history of malignant neoplasm of breast: Secondary | ICD-10-CM | POA: Diagnosis not present

## 2022-01-20 DIAGNOSIS — Z800 Family history of malignant neoplasm of digestive organs: Secondary | ICD-10-CM | POA: Diagnosis not present

## 2022-01-20 DIAGNOSIS — M545 Low back pain, unspecified: Secondary | ICD-10-CM | POA: Diagnosis not present

## 2022-01-20 DIAGNOSIS — W19XXXA Unspecified fall, initial encounter: Secondary | ICD-10-CM | POA: Diagnosis not present

## 2022-01-20 DIAGNOSIS — E782 Mixed hyperlipidemia: Secondary | ICD-10-CM | POA: Diagnosis not present

## 2022-01-20 LAB — GENETIC SCREENING ORDER

## 2022-01-20 NOTE — Progress Notes (Signed)
REFERRING PROVIDER: ?Lavena Bullion, DO ?[facility] ?[address] ?[place],  [place] [address] ? ?PRIMARY PROVIDER:  ?Garwin Brothers, MD ? ?PRIMARY REASON FOR VISIT:  ?1. Family history of pancreatic cancer   ?2. Family history of colon cancer   ?3. Family history of kidney cancer   ?4. Personal history of breast cancer   ? ? ? ?HISTORY OF PRESENT ILLNESS:   ?Haley Beasley, a 66 y.o. female, was seen for a [facility] cancer genetics consultation at the request of Dr. Bryan Lemma due to a personal and family history of cancer.  Haley Beasley presents to clinic today to discuss the possibility of a hereditary predisposition to cancer, genetic testing, and to further clarify her future cancer risks, as well as potential cancer risks for family members.  ? ?In 2006, at the age of 69, Haley Beasley was diagnosed with breast cancer. She had a recurrence in 2009 and had bilateral mastectomy. ? ?CANCER HISTORY:  ?Oncology History  ? No history exists.  ? ? ? ?RISK FACTORS:  ?Menarche was at age 11.  ?First live birth at age 57.  ?OCP use for approximately 0 years.  ?Ovaries intact: no.  ?Hysterectomy: yes.  ?Menopausal status: postmenopausal.  ?HRT use: 3 years. ?Colonoscopy: yes; normal. ?Mammogram within the last year: yes. ? ?Past Medical History:  ?Diagnosis Date  ? Anxiety   ? Breast cancer ([facility])   ? Depression   ? GERD (gastroesophageal reflux disease)   ? Hypertension   ? Sleep apnea   ? Thyroid disease   ? ? ?Past Surgical History:  ?Procedure Laterality Date  ? ABDOMINAL HYSTERECTOMY    ? CHOLECYSTECTOMY    ? MASTECTOMY Bilateral   ? THYROIDECTOMY    ? ? ?Social History  ? ?Socioeconomic History  ? Marital status: Married  ?  Spouse name: Not on file  ? Number of children: Not on file  ? Years of education: Not on file  ? Highest education level: Not on file  ?Occupational History  ? Not on file  ?Tobacco Use  ? Smoking status: Former  ?  Types: Cigarettes  ?  Quit date: 09/23/2007  ?  Years since quitting: 14.3  ?  Smokeless tobacco: Never  ?Substance and Sexual Activity  ? Alcohol use: Not on file  ? Drug use: Not on file  ? Sexual activity: Not on file  ?Other Topics Concern  ? Not on file  ?Social History Narrative  ? Not on file  ? ?Social Determinants of Health  ? ?Financial Resource Strain: Not on file  ?Food Insecurity: Not on file  ?Transportation Needs: Not on file  ?Physical Activity: Not on file  ?Stress: Not on file  ?Social Connections: Not on file  ?  ? ?FAMILY HISTORY:  ?We obtained a detailed, 4-generation family history.  Significant diagnoses are listed below: ?Family History  ?Problem Relation Age of Onset  ? Colon cancer Mother   ? Esophageal cancer Brother   ? Renal cancer Brother   ? Brain cancer Maternal Uncle   ? Renal cancer Paternal Uncle   ? ?Haley Beasley  has 1 daughter and 1 son. Her daughter reportedly had negative genetic testing in ~2010. Haley Beasley has 31 full brothers, 4 full sisters, 1 paternal half brother. Her paternal half brother had lung cancer and passed in his 91s. A full brother had pancreatic cancer and lung cancer and passed at 57. Another full brother had kidney cancer and lung cancer and passed  at 56. Another brother had lung cancer, passed at 69. Another brother had lung cancer and is living at 81. A niece had breast cancer in her 19s.  ? ?Haley Beasley's mother died at 45 and had colon cancer. Patient had 5 maternal uncles, 5 aunts. One uncle had brain cancer and passed under age 63. Three cousins (none sisters) had breast cancer. One in her 30s-40s, another in her 72s, and the third around age 4. Maternal grandmother passed at 40. Grandfather passed in his 67s. ? ?Haley Beasley's father died at 53. Patient had 5 paternal aunts, 3 paternal uncles. One uncle had kidney cancer in his 68s and passed in his 3s. An aunt possibly had stomach/GI cancer, but patient is not certain. A cousin had breast cancer at 28. Paternal grandparents both passed when they were young.  ? ?Haley Beasley is  unaware of previous family history of genetic testing for hereditary cancer risks. Patient's maternal ancestors are of English/Irish descent, and paternal ancestors are of English/Irish descent. There is no reported Ashkenazi Jewish ancestry. There is no known consanguinity. ? ? ? ?GENETIC COUNSELING ASSESSMENT: Haley Beasley is a 66 y.o. female with a personal history of breast cancer and family history of pancreatic cancer which is somewhat suggestive of a hereditary cancer syndrome and predisposition to cancer. We, therefore, discussed and recommended the following at today's visit.  ? ?DISCUSSION: We discussed that approximately 10% of breast/pancreatic cancer is hereditary. Most cases of hereditary breast/pancreatic cancer are associated with BRCA1/BRCA2 genes, although there are other genes associated with hereditary cancer as well. Cancers and risks are gene specific. We discussed that testing is beneficial for several reasons including knowing about cancer risks, identifying potential screening and risk-reduction options that may be appropriate, and to understand if other family members could be at risk for cancer and allow them to undergo genetic testing.  ? ?We reviewed the characteristics, features and inheritance patterns of hereditary cancer syndromes. We also discussed genetic testing, including the appropriate family members to test, the process of testing, insurance coverage and turn-around-time for results. We discussed the implications of a negative, positive and/or variant of uncertain significant result. We recommended Haley Beasley pursue genetic testing for the [facility] CancerNext-Expanded+RNA gene panel.  ? ?Based on Haley Beasley's personal and family history of cancer, she meets medical criteria for genetic testing. Despite that she meets criteria, she may still have an out of pocket cost. We discussed that if her out of pocket cost for testing is over $100, the laboratory will call and confirm  whether she wants to proceed with testing.  If the out of pocket cost of testing is less than $100 she will be billed by the genetic testing laboratory.  ? ?PLAN: After considering the risks, benefits, and limitations, Haley Beasley provided informed consent to pursue genetic testing and the blood sample was sent to [facility] for analysis of the CancerNext-Expanded+RNA panel. Results should be available within approximately 2-3 weeks' time, at which point they will be disclosed by telephone to Haley Beasley, as will any additional recommendations warranted by these results. Haley Beasley will receive a summary of her genetic counseling visit and a copy of her results once available. This information will also be available in Epic.  ? ?Haley Beasley's questions were answered to her satisfaction today. Our contact information was provided should additional questions or concerns arise. Thank you for the referral and allowing [place] to share in the care of your patient.  ? ?Faith Rogue, MS,  [place] ?Dietitian ?[email or web address] ?Phone: [phone] ? ?The patient was seen for a total of 30 minutes in face-to-face genetic counseling.  Dr. Grayland Ormond was available for discussion regarding this case.  ? ?_______________________________________________________________________ ?For Office Staff:  ?Number of people involved in session: 1 ?Was an Intern/ student involved with case: no ? ?

## 2022-01-22 ENCOUNTER — Encounter: Payer: Self-pay | Admitting: Gastroenterology

## 2022-02-04 ENCOUNTER — Telehealth: Payer: Self-pay | Admitting: Licensed Clinical Social Worker

## 2022-02-04 ENCOUNTER — Encounter: Payer: Self-pay | Admitting: Licensed Clinical Social Worker

## 2022-02-04 ENCOUNTER — Ambulatory Visit: Payer: Self-pay | Admitting: Licensed Clinical Social Worker

## 2022-02-04 DIAGNOSIS — Z1379 Encounter for other screening for genetic and chromosomal anomalies: Secondary | ICD-10-CM

## 2022-02-04 DIAGNOSIS — M5416 Radiculopathy, lumbar region: Secondary | ICD-10-CM | POA: Diagnosis not present

## 2022-02-04 NOTE — Progress Notes (Signed)
HPI:  Haley Beasley was previously seen in the [facility] due to a personal and family history of cancer and concerns regarding a hereditary predisposition to cancer. Please refer to our prior cancer genetics clinic note for more information regarding our discussion, assessment and recommendations, at the time. Haley Beasley recent genetic test results were disclosed to her, as were recommendations warranted by these results. These results and recommendations are discussed in more detail below. ? ?CANCER HISTORY:  ?Oncology History  ? No history exists.  ? ? ?FAMILY HISTORY:  ?We obtained a detailed, 4-generation family history.  Significant diagnoses are listed below: ?Family History  ?Problem Relation Age of Onset  ? Colon cancer Mother 32  ? Lung cancer Brother   ?     d. 54s  ? Renal cancer Brother   ? Pancreatic cancer Brother   ?     d. 82s  ? Lung cancer Brother   ? Lung cancer Brother   ? Lung cancer Brother   ? Brain cancer Maternal Uncle   ?     d 60s  ? Renal cancer Paternal Uncle   ?     d 46s  ? Lung cancer Half-Brother   ? Breast cancer Cousin   ?     dx 71s  ? Breast cancer Cousin   ?     1 dx 30s-40s, 2 dx 24s  ? ?Haley Beasley  has 1 daughter and 1 son. Her daughter reportedly had negative genetic testing in ~2010. Haley Beasley has 79 full brothers, 4 full sisters, 1 paternal half brother. Her paternal half brother had lung cancer and passed in his 40s. A full brother had pancreatic cancer and lung cancer and passed at 19. Another full brother had kidney cancer and lung cancer and passed at 81. Another brother had lung cancer, passed at 41. Another brother had lung cancer and is living at 45. A niece had breast cancer in her 41s.  ?  ?Haley Beasley's mother died at 31 and had colon cancer. Patient had 5 maternal uncles, 5 aunts. One uncle had brain cancer and passed under age 47. Three cousins (none sisters) had breast cancer. One in her 30s-40s, another in her 79s, and the third  around age 60. Maternal grandmother passed at 43. Grandfather passed in his 83s. ?  ?Haley Beasley's father died at 44. Patient had 5 paternal aunts, 3 paternal uncles. One uncle had kidney cancer in his 67s and passed in his 58s. An aunt possibly had stomach/GI cancer, but patient is not certain. A cousin had breast cancer at 68. Paternal grandparents both passed when they were young.  ?  ?Haley Beasley is unaware of previous family history of genetic testing for hereditary cancer risks. Patient's maternal ancestors are of English/Irish descent, and paternal ancestors are of English/Irish descent. There is no reported Ashkenazi Jewish ancestry. There is no known consanguinity. ?  ? ? ? ?GENETIC TEST RESULTS: Genetic testing reported out on 01/31/2022 through the [facility] CancerNext-Expanded+RNA cancer panel found no pathogenic mutations.  ? ?The CancerNext-Expanded + RNAinsight gene panel offered by [employer] and includes sequencing and rearrangement analysis for the following 77 genes: IP, ALK, APC*, ATM*, AXIN2, BAP1, BARD1, BLM, BMPR1A, BRCA1*, BRCA2*, BRIP1*, CDC73, CDH1*,CDK4, CDKN1B, CDKN2A, CHEK2*, CTNNA1, DICER1, FANCC, FH, FLCN, GALNT12, KIF1B, LZTR1, MAX, MEN1, MET, MLH1*, MSH2*, MSH3, MSH6*, MUTYH*, NBN, NF1*, NF2, NTHL1, PALB2*, PHOX2B, PMS2*, POT1, PRKAR1A, PTCH1, PTEN*, RAD51C*, RAD51D*,RB1, RECQL, RET, SDHA, SDHAF2, SDHB, SDHC,  SDHD, SMAD4, SMARCA4, SMARCB1, SMARCE1, STK11, SUFU, TMEM127, TP53*,TSC1, TSC2, VHL and XRCC2 (sequencing and deletion/duplication); EGFR, EGLN1, HOXB13, KIT, MITF, PDGFRA, POLD1 and POLE (sequencing only); EPCAM and GREM1 (deletion/duplication only).  ? ?The test report has been scanned into EPIC and is located under the Molecular Pathology section of the Results Review tab.  A portion of the result report is included below for reference.  ? ? ? ?We discussed that because current genetic testing is not perfect, it is possible there may be a gene mutation in one of these genes  that current testing cannot detect, but that chance is small.  There could be another gene that has not yet been discovered, or that we have not yet tested, that is responsible for the cancer diagnoses in the family. It is also possible there is a hereditary cause for the cancer in the family that Haley Beasley did not inherit and therefore was not identified in her testing.  Therefore, it is important to remain in touch with cancer genetics in the future so that we can continue to offer Haley Beasley the most up to date genetic testing.  ? ?ADDITIONAL GENETIC TESTING: We discussed with Haley Beasley that her genetic testing was fairly extensive.  If there are genes identified to increase cancer risk that can be analyzed in the future, we would be happy to discuss and coordinate this testing at that time.   ? ?CANCER SCREENING RECOMMENDATIONS: Haley Beasley test result is considered negative (normal).  This means that we have not identified a hereditary cause for her  personal and family history of cancer at this time. Most cancers happen by chance and this negative test suggests that her cancer may fall into this category.   ? ?While reassuring, this does not definitively rule out a hereditary predisposition to cancer. It is still possible that there could be genetic mutations that are undetectable by current technology. There could be genetic mutations in genes that have not been tested or identified to increase cancer risk.  Therefore, it is recommended she continue to follow the cancer management and screening guidelines provided by her primary healthcare provider.  ? ?An individual's cancer risk and medical management are not determined by genetic test results alone. Overall cancer risk assessment incorporates additional factors, including personal medical history, family history, and any available genetic information that may result in a personalized plan for cancer prevention and surveillance. ? ?RECOMMENDATIONS  FOR FAMILY MEMBERS:  Relatives in this family might be at some increased risk of developing cancer, over the general population risk, simply due to the family history of cancer.  We recommended female relatives in this family have a yearly mammogram beginning at age 36, or 29 years younger than the earliest onset of cancer, an annual clinical breast exam, and perform monthly breast self-exams. Female relatives in this family should also have a gynecological exam as recommended by their primary provider.  All family members should be referred for colonoscopy starting at age 48.  ? ? It is also possible there is a hereditary cause for the cancer in Haley Beasley's family that she did not inherit and therefore was not identified in her.  Based on Haley Beasley's family history, we recommended maternal relatives, especially those who have had cancer,  have genetic counseling and testing. Haley Beasley will let us know if we can be of any assistance in coordinating genetic counseling and/or testing for these family members. ? ?FOLLOW-UP: Lastly, we discussed with Haley Beasley that  cancer genetics is a rapidly advancing field and it is possible that new genetic tests will be appropriate for her and/or her family members in the future. We encouraged her to remain in contact with cancer genetics on an annual basis so we can update her personal and family histories and let her know of advances in cancer genetics that may benefit this family.  ? ?Our contact number was provided. Ms. Budzinski questions were answered to her satisfaction, and she knows she is welcome to call us at anytime with additional questions or concerns.  ? ?Faith Rogue, MS, LCGC ?Genetic Counselor ?[email or web address] ?Phone: [phone] ? ?

## 2022-02-04 NOTE — Telephone Encounter (Signed)
Revealed negative genetic testing.  This normal result is reassuring and indicates that it is unlikely Haley Beasley's cancer is due to a hereditary cause.  It is unlikely that there is an increased risk of another cancer due to a mutation in one of these genes.  However, genetic testing is not perfect, and cannot definitively rule out a hereditary cause.  It will be important for her to keep in contact with genetics to learn if any additional testing may be needed in the future.    ? ?

## 2022-02-07 NOTE — Progress Notes (Signed)
Cardiology Office Note   Date:  02/14/2022   ID:  Haley Beasley, Haley Beasley April 21, 1956, MRN [id number]  PCP:  Garwin Brothers, MD  Cardiologist:   Dalma Panchal Martinique, MD   Chief Complaint  Patient presents with   New Patient (Initial Visit)   Edema    Ankles.      History of Present Illness: Haley Beasley is a 66 y.o. female who is seen at the request of Dr Reesa Chew for evaluation of Hyperlipidemia and venous insufficiency.  She has a history of HTN, OSA, and thyroid disease. I am also seeing her husband Haley Beasley. They have moved from [place] and establishing care. In February 2022 she had RF ablation of the small saphenous vein and bilateral great saphenous vein for painful varicose veins. She still complains of pain in her legs especially at night or with prolonged standing. (Records copied in Media under AMB correspondence). Other evaluation there include normal carotid dopplers. In November 2020 she had a normal Echo and normal nuclear stress test.  She has HLD and is intolerant of statins. Only taking Zetia. She comes from a family of 15 children. Multiple family members have HLD and HTN. Only one brother had CABG. Her son did die in his sleep and apparently had CHF.     Past Medical History:  Diagnosis Date   Anxiety    Breast cancer ([place])    Depression    GERD (gastroesophageal reflux disease)    Hypertension    Sleep apnea    Thyroid disease     Past Surgical History:  Procedure Laterality Date   ABDOMINAL HYSTERECTOMY     CHOLECYSTECTOMY     MASTECTOMY Bilateral    THYROIDECTOMY       Current Outpatient Medications  Medication Sig Dispense Refill   ezetimibe (ZETIA) 10 MG tablet ezetimibe 10 mg tablet     fluticasone (FLONASE) 50 MCG/ACT nasal spray      losartan (COZAAR) 25 MG tablet losartan 25 mg tablet     pantoprazole (PROTONIX) 40 MG tablet pantoprazole 40 mg tablet,delayed release     SYNTHROID 50 MCG tablet      No current facility-administered medications for this  visit.    Allergies:   Fluogen [influenza virus vaccine] and Penicillins    Social History:  The patient  reports that she quit smoking about 14 years ago. Her smoking use included cigarettes. She has never used smokeless tobacco.   Family History:  The patient's family history includes Brain cancer in her maternal uncle; Breast cancer in her cousin and cousin; Colon cancer (age of onset: 26) in her mother; Heart attack in her son; Heart attack (age of onset: 51) in her father; Heart disease (age of onset: 66) in her son; Heart disease (age of onset: 22) in her brother; Heart failure in her mother; Lung cancer in her brother, brother, brother, brother, and half-brother; Pancreatic cancer in her brother; Renal cancer in her brother and paternal uncle.    ROS:  Please see the history of present illness.   Otherwise, review of systems are positive for none.   All other systems are reviewed and negative.    PHYSICAL EXAM: VS:  BP (!) 114/8 (BP Location: Right Arm, Patient Position: Sitting, Cuff Size: Normal)   Pulse 63   Ht '5\' 8"'$  (1.727 m)   Wt 191 lb (86.6 kg)   BMI 29.04 kg/m  , BMI Body mass index is 29.04 kg/m. GEN: Well nourished, well developed, in  no acute distress HEENT: normal Neck: no JVD, carotid bruits, or masses Cardiac: RRR; no murmurs, rubs, or gallops,no edema. LE varicosities Respiratory:  clear to auscultation bilaterally, normal work of breathing GI: soft, nontender, nondistended, + BS MS: no deformity or atrophy Skin: warm and dry, no rash Neuro:  Strength and sensation are intact Psych: euthymic mood, full affect   EKG:  EKG is ordered today. The ekg ordered today demonstrates NSR with normal Ecg. I have personally reviewed and interpreted this study.    Recent Labs: No results found for requested labs within last 8760 hours.    Lipid Panel No results found for: CHOL, TRIG, HDL, CHOLHDL, VLDL, LDLCALC, LDLDIRECT   Dated 10/22/21: cholesterol 254,  triglycerides 123, HDL 72, LDL 160. A1c 5.9%. CMET, CBC normal.  Dated 12/24/21: normal TSH  Wt Readings from Last 3 Encounters:  02/14/22 191 lb (86.6 kg)  01/14/22 192 lb (87.1 kg)  12/13/21 192 lb (87.1 kg)      Other studies Reviewed: Additional studies/ records that were reviewed today include: see HPI   ASSESSMENT AND PLAN:  1.  Hypercholesterolemia. LDL 160 despite Zetia. Intolerant of statins. Will arrange for coronary calcium score. If zero may defer additional lipid lowering therapy for now but if abnormal would consider her for PCSK 9 inhibitor.  2. Normal Echo and nuclear stress test in 2020 3. Venous stasis disease. She is concerned that she needs additional ablation. Will refer to [facility].  4. HTN well controlled on losartan   Current medicines are reviewed at length with the patient today.  The patient does not have concerns regarding medicines.  The following changes have been made:  no change  Labs/ tests ordered today include:   Orders Placed This Encounter  Procedures   CT CARDIAC SCORING (SELF PAY ONLY)   Ambulatory referral to Vascular Surgery   EKG 12-Lead         Disposition:   FU with me in 1 year  Signed, Tirzah Fross Martinique, MD  02/14/2022 11:56 AM    [facility] [address] [place], [place], [address] Phone [phone], Fax [phone]

## 2022-02-10 DIAGNOSIS — M545 Low back pain, unspecified: Secondary | ICD-10-CM | POA: Diagnosis not present

## 2022-02-14 ENCOUNTER — Encounter: Payer: Self-pay | Admitting: Cardiology

## 2022-02-14 ENCOUNTER — Ambulatory Visit (INDEPENDENT_AMBULATORY_CARE_PROVIDER_SITE_OTHER): Payer: Medicare HMO | Admitting: Cardiology

## 2022-02-14 ENCOUNTER — Ambulatory Visit: Payer: Medicare HMO | Admitting: Cardiology

## 2022-02-14 VITALS — BP 114/8 | HR 63 | Ht 68.0 in | Wt 191.0 lb

## 2022-02-14 DIAGNOSIS — I872 Venous insufficiency (chronic) (peripheral): Secondary | ICD-10-CM | POA: Diagnosis not present

## 2022-02-14 DIAGNOSIS — E785 Hyperlipidemia, unspecified: Secondary | ICD-10-CM

## 2022-02-14 DIAGNOSIS — I8393 Asymptomatic varicose veins of bilateral lower extremities: Secondary | ICD-10-CM | POA: Diagnosis not present

## 2022-02-14 DIAGNOSIS — M545 Low back pain, unspecified: Secondary | ICD-10-CM | POA: Diagnosis not present

## 2022-02-14 NOTE — Patient Instructions (Signed)
Medication Instructions:  Your physician recommends that you continue on your current medications as directed. Please refer to the Current Medication list given to you today.  *If you need a refill on your cardiac medications before your next appointment, please call your pharmacy*    Testing/Procedures: Dr. Gwenlyn Found has ordered a CT coronary calcium score.   Test locations:  [facility] ([address] [place], [place] [address]) [facility] ([address] [place], [place] [address])   This is $99 out of pocket.   Coronary CalciumScan A coronary calcium scan is an imaging test used to look for deposits of calcium and other fatty materials (plaques) in the inner lining of the blood vessels of the heart (coronary arteries). These deposits of calcium and plaques can partly clog and narrow the coronary arteries without producing any symptoms or warning signs. This puts a person at risk for a heart attack. This test can detect these deposits before symptoms develop. Tell a health care provider about: Any allergies you have. All medicines you are taking, including vitamins, herbs, eye drops, creams, and over-the-counter medicines. Any problems you or family members have had with anesthetic medicines. Any blood disorders you have. Any surgeries you have had. Any medical conditions you have. Whether you are pregnant or may be pregnant. What are the risks? Generally, this is a safe procedure. However, problems may occur, including: Harm to a pregnant woman and her unborn baby. This test involves the use of radiation. Radiation exposure can be dangerous to a pregnant woman and her unborn baby. If you are pregnant, you generally should not have this procedure done. Slight increase in the risk of cancer. This is because of the radiation involved in the test. What happens before the procedure? No preparation is needed for this procedure. What happens during the procedure? You  will undress and remove any jewelry around your neck or chest. You will put on a hospital gown. Sticky electrodes will be placed on your chest. The electrodes will be connected to an electrocardiogram (ECG) machine to record a tracing of the electrical activity of your heart. A CT scanner will take pictures of your heart. During this time, you will be asked to lie still and hold your breath for 2-3 seconds while a picture of your heart is being taken. The procedure may vary among health care providers and hospitals. What happens after the procedure? You can get dressed. You can return to your normal activities. It is up to you to get the results of your test. Ask your health care provider, or the department that is doing the test, when your results will be ready. Summary A coronary calcium scan is an imaging test used to look for deposits of calcium and other fatty materials (plaques) in the inner lining of the blood vessels of the heart (coronary arteries). Generally, this is a safe procedure. Tell your health care provider if you are pregnant or may be pregnant. No preparation is needed for this procedure. A CT scanner will take pictures of your heart. You can return to your normal activities after the scan is done. This information is not intended to replace advice given to you by your health care provider. Make sure you discuss any questions you have with your health care provider. Document Released: 03/06/2008 Document Revised: 07/28/2016 Document Reviewed: 07/28/2016 Elsevier Interactive Patient Education  2017 [place]: At [facility], you and your health needs are our priority.  As part of  our continuing mission to provide you with exceptional heart care, we have created designated Provider Care Teams.  These Care Teams include your primary Cardiologist (physician) and Advanced Practice Providers (APPs -  Physician Assistants and Nurse Practitioners) who all work  together to provide you with the care you need, when you need it.  We recommend signing up for the patient portal called "MyChart".  Sign up information is provided on this After Visit Summary.  MyChart is used to connect with patients for Virtual Visits (Telemedicine).  Patients are able to view lab/test results, encounter notes, upcoming appointments, etc.  Non-urgent messages can be sent to your provider as well.   To learn more about what you can do with MyChart, go to [email or web address].    Your next appointment:   3 month(s)  The format for your next appointment:   In Person  Provider:   Peter Martinique, MD

## 2022-02-26 DIAGNOSIS — E039 Hypothyroidism, unspecified: Secondary | ICD-10-CM | POA: Diagnosis not present

## 2022-02-26 NOTE — Addendum Note (Signed)
Addended by: Fidel Levy on: 02/26/2022 09:37 AM   Modules accepted: Orders

## 2022-03-03 DIAGNOSIS — G4733 Obstructive sleep apnea (adult) (pediatric): Secondary | ICD-10-CM | POA: Diagnosis not present

## 2022-03-03 DIAGNOSIS — Z87891 Personal history of nicotine dependence: Secondary | ICD-10-CM | POA: Diagnosis not present

## 2022-03-03 DIAGNOSIS — J301 Allergic rhinitis due to pollen: Secondary | ICD-10-CM | POA: Diagnosis not present

## 2022-03-03 DIAGNOSIS — R0600 Dyspnea, unspecified: Secondary | ICD-10-CM | POA: Diagnosis not present

## 2022-03-05 ENCOUNTER — Ambulatory Visit (HOSPITAL_BASED_OUTPATIENT_CLINIC_OR_DEPARTMENT_OTHER)
Admission: RE | Admit: 2022-03-05 | Discharge: 2022-03-05 | Disposition: A | Discharge status: Home / Self Care | Payer: Medicare HMO | Source: Ambulatory Visit | Attending: Cardiology | Admitting: Cardiology

## 2022-03-05 DIAGNOSIS — E785 Hyperlipidemia, unspecified: Secondary | ICD-10-CM | POA: Insufficient documentation

## 2022-03-06 ENCOUNTER — Other Ambulatory Visit: Payer: Self-pay | Admitting: Cardiology

## 2022-03-06 DIAGNOSIS — I209 Angina pectoris, unspecified: Secondary | ICD-10-CM

## 2022-03-06 MED ORDER — SODIUM CHLORIDE 0.9% FLUSH: 3.0000 mL | Freq: Two times a day (BID) | INTRAVENOUS | Status: DC

## 2022-03-19 ENCOUNTER — Encounter: Payer: Self-pay | Admitting: Vascular Surgery

## 2022-03-19 ENCOUNTER — Ambulatory Visit (INDEPENDENT_AMBULATORY_CARE_PROVIDER_SITE_OTHER): Payer: Medicare HMO | Admitting: Vascular Surgery

## 2022-03-19 ENCOUNTER — Other Ambulatory Visit: Payer: Self-pay | Admitting: *Deleted

## 2022-03-19 ENCOUNTER — Ambulatory Visit (HOSPITAL_COMMUNITY)
Admission: RE | Admit: 2022-03-19 | Discharge: 2022-03-19 | Disposition: A | Discharge status: Home / Self Care | Payer: Medicare HMO | Source: Ambulatory Visit | Attending: Vascular Surgery | Admitting: Vascular Surgery

## 2022-03-19 VITALS — BP 100/64 | HR 70 | Temp 98.3°F | Resp 18 | Ht 68.0 in | Wt 191.5 lb

## 2022-03-19 DIAGNOSIS — I872 Venous insufficiency (chronic) (peripheral): Secondary | ICD-10-CM | POA: Diagnosis not present

## 2022-03-19 DIAGNOSIS — R0989 Other specified symptoms and signs involving the circulatory and respiratory systems: Secondary | ICD-10-CM

## 2022-03-19 DIAGNOSIS — M7989 Other specified soft tissue disorders: Secondary | ICD-10-CM

## 2022-03-19 NOTE — Progress Notes (Signed)
ASSESSMENT & PLAN   CHRONIC VENOUS INSUFFICIENCY: This patient does describe some aching pain and heaviness especially in the left leg which could be consistent with symptoms from venous hypertension.  We have discuss the importance of daily leg elevation and proper positioning for this.  In addition I encouraged her to avoid prolonged sitting and standing.  We discussed the use of compression stockings.  I would recommend a knee-high stocking with a gradient of 15 to 20 mmHg to start.  We also discussed the importance of exercise specifically walking and water aerobics.  If her symptoms progress we would have to do formal venous reflux testing to further assess her superficial veins such as the anterior accessory saphenous vein and small saphenous veins.  She will call if her symptoms progress.  RIGHT CAROTID BRUIT: I did detect a right carotid bruit on exam.  For this reason we obtain a carotid duplex scan today which did not show any significant carotid disease bilaterally.  She has some elevated velocities in the external carotid artery which likely explains her bruit.  No further follow-up for this is needed.  REASON FOR CONSULT:    Painful varicose veins.  The consult is requested by Dr. Peter Martinique.  HPI:   Haley Beasley is a 66 y.o. female who was referred for the evaluation of venous disease.  The patient underwent laser ablation of the right great saphenous vein in 2021 in [place].  She subsequently had laser ablation of the left great saphenous vein in 2022.  Her symptoms on the right leg improved after the procedure.  However she has had some persistent symptoms on the left.  She describes aching pain in her left leg.  The symptoms occur mostly at night.  During the day she is very active and does not experience significant symptoms when she is active.  She denies any significant back pain.  I do not get any history of claudication or rest pain.  She did fall 2 months ago landing on  her back but her symptoms in the left leg began prior to that.  Her risk factors for peripheral arterial disease include hypertension and hypercholesterolemia.  She denies any history of diabetes.  She does have a family history of premature cardiovascular disease.  She also has a remote history of tobacco use.  She denies any history of stroke, TIAs, expressive or receptive aphasia, or amaurosis fugax.  Past Medical History:  Diagnosis Date   Anxiety    Breast cancer ([place])    Depression    GERD (gastroesophageal reflux disease)    Hypertension    Sleep apnea    Thyroid disease     Family History  Problem Relation Age of Onset   Heart failure Mother    Colon cancer Mother 56   Heart attack Father 59   Heart disease Brother 56       CAD wiht CABG   Lung cancer Brother        d. 74s   Renal cancer Brother    Pancreatic cancer Brother        d. 66s   Lung cancer Brother    Lung cancer Brother    Lung cancer Brother    Brain cancer Maternal Uncle        d 39s   Renal cancer Paternal Uncle        d 63s   Breast cancer Cousin        dx 47s  Breast cancer Cousin        1 dx 30s-40s, 2 dx 68s   Lung cancer Half-Brother    Heart disease Son 67       CHF   Heart attack Son     SOCIAL HISTORY: Social History   Tobacco Use   Smoking status: Former    Types: Cigarettes    Quit date: 09/23/2007    Years since quitting: 14.4   Smokeless tobacco: Never  Substance Use Topics   Alcohol use: Not on file    Allergies  Allergen Reactions   Fluogen [Influenza Virus Vaccine]    Penicillins     Current Outpatient Medications  Medication Sig Dispense Refill   ezetimibe (ZETIA) 10 MG tablet ezetimibe 10 mg tablet     fluticasone (FLONASE) 50 MCG/ACT nasal spray      losartan (COZAAR) 25 MG tablet losartan 25 mg tablet     pantoprazole (PROTONIX) 40 MG tablet pantoprazole 40 mg tablet,delayed release     SYNTHROID 50 MCG tablet      Current Facility-Administered  Medications  Medication Dose Route Frequency Provider Last Rate Last Admin   sodium chloride flush (NS) 0.9 % injection 3 mL  3 mL Intravenous Q12H Martinique, Peter M, MD        REVIEW OF SYSTEMS:  '[X]'$  denotes positive finding, '[ ]'$  denotes negative finding Cardiac  Comments:  Chest pain or chest pressure:    Shortness of breath upon exertion: x   Short of breath when lying flat:    Irregular heart rhythm:        Vascular    Pain in calf, thigh, or hip brought on by ambulation:    Pain in feet at night that wakes you up from your sleep:  x   Blood clot in your veins:    Leg swelling:         Pulmonary    Oxygen at home:    Productive cough:     Wheezing:         Neurologic    Sudden weakness in arms or legs:     Sudden numbness in arms or legs:     Sudden onset of difficulty speaking or slurred speech:    Temporary loss of vision in one eye:     Problems with dizziness:         Gastrointestinal    Blood in stool:     Vomited blood:         Genitourinary    Burning when urinating:     Blood in urine:        Psychiatric    Major depression:  x       Hematologic    Bleeding problems:    Problems with blood clotting too easily:        Skin    Rashes or ulcers:        Constitutional    Fever or chills:    -  PHYSICAL EXAM:   Vitals:   03/19/22 1007  BP: 100/64  Pulse: 70  Resp: 18  Temp: 98.3 F (36.8 C)  TempSrc: Temporal  SpO2: 97%  Weight: 191 lb 8 oz (86.9 kg)  Height: '5\' 8"'$  (1.727 m)   Body mass index is 29.12 kg/m. GENERAL: The patient is a well-nourished female, in no acute distress. The vital signs are documented above. CARDIAC: There is a regular rate and rhythm.  VASCULAR: She has a right carotid bruit. I could not  palpate pedal pulses however she had biphasic posterior tibial signals bilaterally and brisk but monophasic dorsalis pedis signals. She had some dilated varicose veins in her right leg but no significant varicosities on the  left.   PULMONARY: There is good air exchange bilaterally without wheezing or rales. ABDOMEN: Soft and non-tender with normal pitched bowel sounds.  MUSCULOSKELETAL: There are no major deformities. NEUROLOGIC: No focal weakness or paresthesias are detected. SKIN: There are no ulcers or rashes noted. PSYCHIATRIC: The patient has a normal affect.  DATA:    CAROTID DUPLEX: I have independently interpreted her carotid duplex scan today.  On the right side there is no significant internal carotid artery stenosis.  There are some elevated velocities in the external carotid artery which likely explains the bruit.  The right vertebral artery is patent with antegrade flow.  On the left side there is no significant internal carotid artery stenosis.  The left vertebral artery is patent with antegrade flow.  Deitra Mayo [facility]

## 2022-03-24 DIAGNOSIS — Z03818 Encounter for observation for suspected exposure to other biological agents ruled out: Secondary | ICD-10-CM | POA: Diagnosis not present

## 2022-03-24 DIAGNOSIS — G4733 Obstructive sleep apnea (adult) (pediatric): Secondary | ICD-10-CM | POA: Diagnosis not present

## 2022-03-24 DIAGNOSIS — R059 Cough, unspecified: Secondary | ICD-10-CM | POA: Diagnosis not present

## 2022-03-24 DIAGNOSIS — R0600 Dyspnea, unspecified: Secondary | ICD-10-CM | POA: Diagnosis not present

## 2022-03-24 DIAGNOSIS — J019 Acute sinusitis, unspecified: Secondary | ICD-10-CM | POA: Diagnosis not present

## 2022-03-24 DIAGNOSIS — J301 Allergic rhinitis due to pollen: Secondary | ICD-10-CM | POA: Diagnosis not present

## 2022-03-28 DIAGNOSIS — W57XXXA Bitten or stung by nonvenomous insect and other nonvenomous arthropods, initial encounter: Secondary | ICD-10-CM | POA: Diagnosis not present

## 2022-03-28 DIAGNOSIS — L03115 Cellulitis of right lower limb: Secondary | ICD-10-CM | POA: Diagnosis not present

## 2022-04-07 DIAGNOSIS — G4733 Obstructive sleep apnea (adult) (pediatric): Secondary | ICD-10-CM | POA: Diagnosis not present

## 2022-04-07 DIAGNOSIS — J301 Allergic rhinitis due to pollen: Secondary | ICD-10-CM | POA: Diagnosis not present

## 2022-04-07 DIAGNOSIS — Z87891 Personal history of nicotine dependence: Secondary | ICD-10-CM | POA: Diagnosis not present

## 2022-04-23 DIAGNOSIS — J301 Allergic rhinitis due to pollen: Secondary | ICD-10-CM | POA: Diagnosis not present

## 2022-04-29 DIAGNOSIS — J301 Allergic rhinitis due to pollen: Secondary | ICD-10-CM | POA: Diagnosis not present

## 2022-04-29 DIAGNOSIS — Z818 Family history of other mental and behavioral disorders: Secondary | ICD-10-CM | POA: Diagnosis not present

## 2022-04-29 DIAGNOSIS — I10 Essential (primary) hypertension: Secondary | ICD-10-CM | POA: Diagnosis not present

## 2022-04-29 DIAGNOSIS — Z6828 Body mass index (BMI) 28.0-28.9, adult: Secondary | ICD-10-CM | POA: Diagnosis not present

## 2022-04-29 DIAGNOSIS — Z0000 Encounter for general adult medical examination without abnormal findings: Secondary | ICD-10-CM | POA: Diagnosis not present

## 2022-05-02 DIAGNOSIS — J301 Allergic rhinitis due to pollen: Secondary | ICD-10-CM | POA: Diagnosis not present

## 2022-05-06 DIAGNOSIS — J301 Allergic rhinitis due to pollen: Secondary | ICD-10-CM | POA: Diagnosis not present

## 2022-05-09 DIAGNOSIS — J301 Allergic rhinitis due to pollen: Secondary | ICD-10-CM | POA: Diagnosis not present

## 2022-05-13 DIAGNOSIS — J301 Allergic rhinitis due to pollen: Secondary | ICD-10-CM | POA: Diagnosis not present

## 2022-05-16 DIAGNOSIS — J301 Allergic rhinitis due to pollen: Secondary | ICD-10-CM | POA: Diagnosis not present

## 2022-05-20 DIAGNOSIS — J301 Allergic rhinitis due to pollen: Secondary | ICD-10-CM | POA: Diagnosis not present

## 2022-05-23 DIAGNOSIS — J301 Allergic rhinitis due to pollen: Secondary | ICD-10-CM | POA: Diagnosis not present

## 2022-05-27 DIAGNOSIS — J301 Allergic rhinitis due to pollen: Secondary | ICD-10-CM | POA: Diagnosis not present

## 2022-05-29 NOTE — Progress Notes (Unsigned)
Cardiology Office Note   Date:  05/29/2022   ID:  Haley Beasley, Haley Beasley 1956/09/06, MRN [id number]  PCP:  Garwin Brothers, MD  Cardiologist:   Kindel Rochefort Martinique, MD   No chief complaint on file.     History of Present Illness: Haley Beasley is a 66 y.o. female who is seen at the request of Dr Reesa Chew for evaluation of Hyperlipidemia and venous insufficiency.  She has a history of HTN, OSA, and thyroid disease. I am also seeing her husband Lennette Bihari. They have moved from [place] and establishing care. In February 2022 she had RF ablation of the small saphenous vein and bilateral great saphenous vein for painful varicose veins. She still complains of pain in her legs especially at night or with prolonged standing. (Records copied in Media under AMB correspondence). Other evaluation there include normal carotid dopplers. In November 2020 she had a normal Echo and normal nuclear stress test.   She has HLD and is intolerant of statins. Only taking Zetia. She comes from a family of 15 children. Multiple family members have HLD and HTN. Only one brother had CABG. Her son did die in his sleep and apparently had CHF.   Since her last visit she did see Dr Scot Dock for evaluation of her venous disease. Conservative measures recommended unless symptoms progress.     Past Medical History:  Diagnosis Date   Anxiety    Breast cancer ([place])    Depression    GERD (gastroesophageal reflux disease)    Hypertension    Sleep apnea    Thyroid disease     Past Surgical History:  Procedure Laterality Date   ABDOMINAL HYSTERECTOMY     CHOLECYSTECTOMY     ENDOVENOUS ABLATION SAPHENOUS VEIN W/ LASER Right 01/2020   [place]   ENDOVENOUS ABLATION SAPHENOUS VEIN W/ LASER Left 05/2020   [place]   MASTECTOMY Bilateral    THYROIDECTOMY       Current Outpatient Medications  Medication Sig Dispense Refill   ezetimibe (ZETIA) 10 MG tablet ezetimibe 10 mg tablet     fluticasone (FLONASE) 50 MCG/ACT nasal spray       losartan (COZAAR) 25 MG tablet losartan 25 mg tablet     pantoprazole (PROTONIX) 40 MG tablet pantoprazole 40 mg tablet,delayed release     SYNTHROID 50 MCG tablet      Current Facility-Administered Medications  Medication Dose Route Frequency Provider Last Rate Last Admin   sodium chloride flush (NS) 0.9 % injection 3 mL  3 mL Intravenous Q12H Martinique, Jhoana Upham M, MD        Allergies:   Fluogen [influenza virus vaccine] and Penicillins    Social History:  The patient  reports that she quit smoking about 14 years ago. Her smoking use included cigarettes. She has never used smokeless tobacco.   Family History:  The patient's family history includes Brain cancer in her maternal uncle; Breast cancer in her cousin and cousin; Colon cancer (age of onset: 31) in her mother; Heart attack in her son; Heart attack (age of onset: 69) in her father; Heart disease (age of onset: 55) in her son; Heart disease (age of onset: 91) in her brother; Heart failure in her mother; Lung cancer in her brother, brother, brother, brother, and half-brother; Pancreatic cancer in her brother; Renal cancer in her brother and paternal uncle.    ROS:  Please see the history of present illness.   Otherwise, review of systems are positive for none.  All other systems are reviewed and negative.    PHYSICAL EXAM: VS:  There were no vitals taken for this visit. , BMI There is no height or weight on file to calculate BMI. GEN: Well nourished, well developed, in no acute distress HEENT: normal Neck: no JVD, carotid bruits, or masses Cardiac: RRR; no murmurs, rubs, or gallops,no edema. LE varicosities Respiratory:  clear to auscultation bilaterally, normal work of breathing GI: soft, nontender, nondistended, + BS MS: no deformity or atrophy Skin: warm and dry, no rash Neuro:  Strength and sensation are intact Psych: euthymic mood, full affect   EKG:  EKG is ordered today. The ekg ordered today demonstrates NSR with normal  Ecg. I have personally reviewed and interpreted this study.    Recent Labs: No results found for requested labs within last 365 days.    Lipid Panel No results found for: "CHOL", "TRIG", "HDL", "CHOLHDL", "VLDL", "LDLCALC", "LDLDIRECT"   Dated 10/22/21: cholesterol 254, triglycerides 123, HDL 72, LDL 160. A1c 5.9%. CMET, CBC normal.  Dated 12/24/21: normal TSH  Wt Readings from Last 3 Encounters:  03/19/22 191 lb 8 oz (86.9 kg)  02/14/22 191 lb (86.6 kg)  01/14/22 192 lb (87.1 kg)      Other studies Reviewed: Additional studies/ records that were reviewed today include:  Coronary Calcium Score   TECHNIQUE: A gated, non-contrast computed tomography scan of the heart was performed using 28m slice thickness. Axial images were analyzed on a dedicated workstation. Calcium scoring of the coronary arteries was performed using the Agatston method.   FINDINGS: Coronary arteries: Normal origins.   Coronary Calcium Score:   Left main: 0   Left anterior descending artery: 30   Left circumflex artery: 2   Right coronary artery: 0   Total: 32   Percentile: 65th   Pericardium: Normal.   Aorta: Normal caliber of ascending aorta. No aortic atherosclerosis noted.   [facility]-cardiac: [facility]   IMPRESSION: Coronary calcium score of 32. This was 65th percentile for age-, race-, and sex-matched controls.   ASSESSMENT AND PLAN:  1.  Hypercholesterolemia. LDL 160 despite Zetia. Intolerant of statins. Coronary calcium score of 32. Need to consider lipid lowering therapy 2. Normal Echo and nuclear stress test in 2020 3. Venous stasis disease. She is concerned that she needs additional ablation. Will refer to [facility].  4. HTN well controlled on losartan   Current medicines are reviewed at length with the patient today.  The patient does not have concerns regarding medicines.  The following changes have been made:  no change  Labs/ tests ordered  today include:   No orders of the defined types were placed in this encounter.        Disposition:   FU with me in 1 year  Signed, Sadey Yandell JMartinique MD  05/29/2022 12:57 PM    [id number] [place] [id number] [phone] Fax [phone]

## 2022-05-30 DIAGNOSIS — J301 Allergic rhinitis due to pollen: Secondary | ICD-10-CM | POA: Diagnosis not present

## 2022-06-02 ENCOUNTER — Ambulatory Visit: Payer: Medicare HMO | Attending: Cardiology | Admitting: Cardiology

## 2022-06-02 ENCOUNTER — Encounter: Payer: Self-pay | Admitting: Cardiology

## 2022-06-02 VITALS — BP 112/64 | HR 98 | Ht 68.0 in | Wt 188.8 lb

## 2022-06-02 DIAGNOSIS — I872 Venous insufficiency (chronic) (peripheral): Secondary | ICD-10-CM

## 2022-06-02 DIAGNOSIS — E785 Hyperlipidemia, unspecified: Secondary | ICD-10-CM

## 2022-06-02 DIAGNOSIS — R0989 Other specified symptoms and signs involving the circulatory and respiratory systems: Secondary | ICD-10-CM

## 2022-06-02 DIAGNOSIS — I2510 Atherosclerotic heart disease of native coronary artery without angina pectoris: Secondary | ICD-10-CM | POA: Diagnosis not present

## 2022-06-02 NOTE — Patient Instructions (Signed)
Medication Instructions:  Continue same medications *If you need a refill on your cardiac medications before your next appointment, please call your pharmacy*   Lab Work: None ordered   Testing/Procedures: None ordered   Follow-Up: At [facility], you and your health needs are our priority.  As part of our continuing mission to provide you with exceptional heart care, we have created designated Provider Care Teams.  These Care Teams include your primary Cardiologist (physician) and Advanced Practice Providers (APPs -  Physician Assistants and Nurse Practitioners) who all work together to provide you with the care you need, when you need it.  We recommend signing up for the patient portal called "MyChart".  Sign up information is provided on this After Visit Summary.  MyChart is used to connect with patients for Virtual Visits (Telemedicine).  Patients are able to view lab/test results, encounter notes, upcoming appointments, etc.  Non-urgent messages can be sent to your provider as well.   To learn more about what you can do with MyChart, go to [email or web address].    Your next appointment:  6 months   Call in Nov to schedule March appointment     The format for your next appointment: Office    Provider:  Dr.Jordan  Schedule appointment with Pharmacist in [place]

## 2022-06-02 NOTE — Addendum Note (Signed)
Addended by: Kathyrn Lass on: 06/02/2022 10:55 AM   Modules accepted: Orders

## 2022-06-03 DIAGNOSIS — J301 Allergic rhinitis due to pollen: Secondary | ICD-10-CM | POA: Diagnosis not present

## 2022-06-06 DIAGNOSIS — J301 Allergic rhinitis due to pollen: Secondary | ICD-10-CM | POA: Diagnosis not present

## 2022-06-10 DIAGNOSIS — J301 Allergic rhinitis due to pollen: Secondary | ICD-10-CM | POA: Diagnosis not present

## 2022-06-13 DIAGNOSIS — J301 Allergic rhinitis due to pollen: Secondary | ICD-10-CM | POA: Diagnosis not present

## 2022-06-17 DIAGNOSIS — J301 Allergic rhinitis due to pollen: Secondary | ICD-10-CM | POA: Diagnosis not present

## 2022-06-20 DIAGNOSIS — J301 Allergic rhinitis due to pollen: Secondary | ICD-10-CM | POA: Diagnosis not present

## 2022-06-25 ENCOUNTER — Ambulatory Visit: Payer: Medicare HMO | Attending: Cardiology | Admitting: Pharmacist Clinician (PhC)/ Clinical Pharmacy Specialist

## 2022-06-25 ENCOUNTER — Encounter: Payer: Self-pay | Admitting: Pharmacist Clinician (PhC)/ Clinical Pharmacy Specialist

## 2022-06-25 DIAGNOSIS — E785 Hyperlipidemia, unspecified: Secondary | ICD-10-CM | POA: Diagnosis not present

## 2022-06-25 NOTE — Assessment & Plan Note (Signed)
Patient with CAD and hyperlipidemia, with LDL at 160 on ezetimibe 10 mg daily.  Reviewed options for lowering LDL cholesterol, including PCSK-9 inhibitors, bempedoic acid and inclisiran.  Discussed mechanisms of action, dosing, side effects and potential decreases in LDL cholesterol.  Also reviewed cost information and potential options for patient assistance.  Answered all patient questions.  Based on this information, patient would prefer to start PCSK9 inhibitor.  Will get insurance authorization, and once approved patient will take 1 injection every 14 days.  Repeat labs in 2-3 months.

## 2022-06-25 NOTE — Progress Notes (Signed)
   06/25/2022 Haley Beasley [birth year] [id number]   HPI:  Haley Beasley is a 66 y.o. female patient of Dr Martinique, who presents today for a lipid clinic evaluation.  See pertinent past medical history below.  She has been living in [place] for the past 13-14 years, but recently moved back to [place] last year.   Other than developing severe allergies to local pollens, she has done well since returning to this area.  Her most recent LDL was 160, earlier this year.  She is unsure if past readings were higher than this or not.    Past Medical History: HTN On losartan 25 mg   CAD Coronary calcium was 65th percentile   Venous insufficiency RF ablation in 2022, still some leg pain  Breast cancer 2009 - mastectomy + chemotherapy   Insurance Carrier:   [employer]     Current Medications:  ezetimibe 10 mg      Cholesterol Goals:  LDL < 70   Intolerant/previously tried:  simvastatin, atorvastatin, rosuvastatin - all caused myalgias     Family history: 14 siblings, multiple with HTN or HLD, 1 brother with known CABG (late 30's); son died CHF; mother had cancer and CHF; sister with unknown heart issues  Diet: mostly home cooked, but occasional eating out.  No prepared foods; vegetables mostly fresh or frozen   Exercise:  bikes  - just finished a 20 mile trek; will join [employer] for winter months  Labs: 09/2021: TC 254, TG 123, HDL 72, LDL 160        Current Outpatient Medications  Medication Sig Dispense Refill   ALPRAZolam (XANAX) 0.25 MG tablet Take 0.25 mg by mouth as needed.     ezetimibe (ZETIA) 10 MG tablet ezetimibe 10 mg tablet     fluticasone (FLONASE) 50 MCG/ACT nasal spray      losartan (COZAAR) 25 MG tablet losartan 25 mg tablet     pantoprazole (PROTONIX) 40 MG tablet pantoprazole 40 mg tablet,delayed release     SYNTHROID 50 MCG tablet 0.75 mcg.     Current Facility-Administered Medications  Medication Dose Route Frequency Provider Last Rate Last Admin   sodium chloride  flush (NS) 0.9 % injection 3 mL  3 mL Intravenous Q12H Martinique, Peter M, MD        Allergies  Allergen Reactions   Fluogen Corie [place] Virus Vaccine]    Penicillins     Past Medical History:  Diagnosis Date   Anxiety    Breast cancer ([place])    Depression    GERD (gastroesophageal reflux disease)    Hypertension    Sleep apnea    Thyroid disease     There were no vitals taken for this visit.   Hyperlipidemia Patient with CAD and hyperlipidemia, with LDL at 160 on ezetimibe 10 mg daily.  Reviewed options for lowering LDL cholesterol, including PCSK-9 inhibitors, bempedoic acid and inclisiran.  Discussed mechanisms of action, dosing, side effects and potential decreases in LDL cholesterol.  Also reviewed cost information and potential options for patient assistance.  Answered all patient questions.  Based on this information, patient would prefer to start PCSK9 inhibitor.  Will get insurance authorization, and once approved patient will take 1 injection every 14 days.  Repeat labs in 2-3 months.    Tommy Medal PharmD CPP [facility] [address] [place], [place] [address] [phone]

## 2022-06-25 NOTE — Patient Instructions (Signed)
Your Results:             Your most recent labs Goal  Total Cholesterol 254 < 200  Triglycerides 123 < 150  HDL (happy/good cholesterol) 72 > 40  LDL (lousy/bad cholesterol 160 < 70   Medication changes:  We will start the process to get Repatha covered by your insurance.  Once the prior authorization is complete, we will call you to let you know and confirm pharmacy information.   You will take one injection every 14 days.    Lab orders:  We want to repeat labs after 2-3 months.  We will send you a lab order to remind you once we get closer to that time.    Patient Assistance:  The [employer] foundation offers assistance to help pay for medication copays.  They will cover copays for all cholesterol lowering meds, including statins, fibrates, omega-3 oils, ezetimibe, Repatha, Praluent, Nexletol, Nexlizet.  The cards are usually good for $2,500 or 12 months, whichever comes first. Go to healthwellfoundation.org Click on "Apply Now" Answer questions as to whom is applying (patient or representative) Your disease fund will be "hypercholesterolemia - Medicare access" They will ask questions about finances and which medications you are taking for cholesterol When you submit, the approval is usually within minutes.  You will need to print the card information from the site You will need to show this information to your pharmacy, they will bill your Medicare Part D plan first -then bill [employer] --for the copay.   You can also call them at [phone], although the hold times can be quite long.   Thank you for choosing [facility]

## 2022-06-26 ENCOUNTER — Encounter: Payer: Self-pay | Admitting: Pharmacist Clinician (PhC)/ Clinical Pharmacy Specialist

## 2022-06-26 ENCOUNTER — Encounter: Payer: Self-pay | Admitting: *Deleted

## 2022-06-27 DIAGNOSIS — J019 Acute sinusitis, unspecified: Secondary | ICD-10-CM | POA: Diagnosis not present

## 2022-06-27 DIAGNOSIS — Z87891 Personal history of nicotine dependence: Secondary | ICD-10-CM | POA: Diagnosis not present

## 2022-06-27 DIAGNOSIS — R0981 Nasal congestion: Secondary | ICD-10-CM | POA: Diagnosis not present

## 2022-06-27 DIAGNOSIS — Z03818 Encounter for observation for suspected exposure to other biological agents ruled out: Secondary | ICD-10-CM | POA: Diagnosis not present

## 2022-06-27 DIAGNOSIS — R070 Pain in throat: Secondary | ICD-10-CM | POA: Diagnosis not present

## 2022-06-27 DIAGNOSIS — G4733 Obstructive sleep apnea (adult) (pediatric): Secondary | ICD-10-CM | POA: Diagnosis not present

## 2022-06-27 DIAGNOSIS — J301 Allergic rhinitis due to pollen: Secondary | ICD-10-CM | POA: Diagnosis not present

## 2022-06-30 ENCOUNTER — Ambulatory Visit: Payer: Medicare HMO | Admitting: Psychiatry

## 2022-07-01 ENCOUNTER — Telehealth: Payer: Self-pay | Admitting: Pharmacist Clinician (PhC)/ Clinical Pharmacy Specialist

## 2022-07-01 DIAGNOSIS — J301 Allergic rhinitis due to pollen: Secondary | ICD-10-CM | POA: Diagnosis not present

## 2022-07-01 MED ORDER — REPATHA SURECLICK 140 MG/ML ~~LOC~~ SOAJ: 140.0000 mg | SUBCUTANEOUS | 12 refills | Status: DC

## 2022-07-01 NOTE — Telephone Encounter (Signed)
Repatha PA approved to 09/22/2023  Key [id number]  Rx sent to [facility]

## 2022-07-08 DIAGNOSIS — Z87891 Personal history of nicotine dependence: Secondary | ICD-10-CM | POA: Diagnosis not present

## 2022-07-08 DIAGNOSIS — G4733 Obstructive sleep apnea (adult) (pediatric): Secondary | ICD-10-CM | POA: Diagnosis not present

## 2022-07-08 DIAGNOSIS — J301 Allergic rhinitis due to pollen: Secondary | ICD-10-CM | POA: Diagnosis not present

## 2022-07-11 DIAGNOSIS — J301 Allergic rhinitis due to pollen: Secondary | ICD-10-CM | POA: Diagnosis not present

## 2022-07-15 DIAGNOSIS — J301 Allergic rhinitis due to pollen: Secondary | ICD-10-CM | POA: Diagnosis not present

## 2022-07-18 DIAGNOSIS — J301 Allergic rhinitis due to pollen: Secondary | ICD-10-CM | POA: Diagnosis not present

## 2022-07-21 ENCOUNTER — Encounter: Payer: Self-pay | Admitting: *Deleted

## 2022-07-21 ENCOUNTER — Ambulatory Visit: Payer: Medicare HMO | Admitting: Diagnostic Neuroimaging

## 2022-07-21 VITALS — BP 127/73 | HR 78 | Ht 68.0 in | Wt 191.2 lb

## 2022-07-21 DIAGNOSIS — Z818 Family history of other mental and behavioral disorders: Secondary | ICD-10-CM | POA: Diagnosis not present

## 2022-07-21 NOTE — Patient Instructions (Signed)
  FAMILY HISTORY OF DEMENTIA - no symptoms currently; monitor for now - continue daily physical activity / exercise - eat more plants / vegetables  - increase social activities, brain stimulation, games, puzzles, hobbies, crafts, arts, music - aim for at least 7-8 hours sleep per night (or more)

## 2022-07-21 NOTE — Progress Notes (Signed)
[facility]  PATIENT: Haley Beasley DOB: Nov 28, 1955  REFERRING CLINICIAN: Townsend Roger, MD HISTORY FROM: patient  REASON FOR VISIT: new consult   HISTORICAL  CHIEF COMPLAINT:  Chief Complaint  Patient presents with   New Patient (Initial Visit)    Pt reports feeling good overall.Pt reports that her sisters have Dementia and is concerned. Room 6 alone    HISTORY OF PRESENT ILLNESS:   66 year old female here for evaluation of family history of dementia.  Patient does not have any complaints of memory problems herself.  She is very active and takes care of resolved.  She and her husband go on long bike rides up to 20 to 30 miles at a time.  She is active around her home.  She able to maintain her ADLs.  She is 1 of 15 children in her family.  There were 10 brothers and 5 sisters.  2 sisters developed dementia (1 with dementia with Lewy bodies, other with also with dementia).  Another sister is developing the signs of dementia currently.  Due to this family history she is concerned about evaluation and screening for herself.   REVIEW OF SYSTEMS: Full 14 system review of systems performed and negative with exception of: as per HPI.  ALLERGIES: Allergies  Allergen Reactions   Fluogen [Influenza Virus Vaccine]    Penicillins     HOME MEDICATIONS: Outpatient Medications Prior to Visit  Medication Sig Dispense Refill   ALPRAZolam (XANAX) 0.25 MG tablet Take 0.25 mg by mouth as needed.     Evolocumab (REPATHA SURECLICK [place]) Inject into the skin.     Evolocumab (REPATHA SURECLICK) 700 MG/ML SOAJ Inject 140 mg into the skin every 14 (fourteen) days. 2 mL 12   fluticasone (FLONASE) 50 MCG/ACT nasal spray      losartan (COZAAR) 25 MG tablet losartan 25 mg tablet     pantoprazole (PROTONIX) 40 MG tablet pantoprazole 40 mg tablet,delayed release     SYNTHROID 50 MCG tablet 0.75 mcg.     ezetimibe (ZETIA) 10 MG tablet ezetimibe 10 mg tablet (Patient not taking:  Reported on 07/21/2022)     Facility-Administered Medications Prior to Visit  Medication Dose Route Frequency Provider Last Rate Last Admin   sodium chloride flush (NS) 0.9 % injection 3 mL  3 mL Intravenous Q12H [place], Peter M, MD        PAST MEDICAL HISTORY: Past Medical History:  Diagnosis Date   Anxiety    Breast cancer ([place])    Depression    GERD (gastroesophageal reflux disease)    Hypertension    Memory loss    Sleep apnea    Thyroid disease     PAST SURGICAL HISTORY: Past Surgical History:  Procedure Laterality Date   ABDOMINAL HYSTERECTOMY     CHOLECYSTECTOMY     ENDOVENOUS ABLATION SAPHENOUS VEIN W/ LASER Right 01/2020   [place]   ENDOVENOUS ABLATION SAPHENOUS VEIN W/ LASER Left 05/2020   [place]   MASTECTOMY Bilateral    THYROIDECTOMY      FAMILY HISTORY: Family History  Problem Relation Age of Onset   Heart failure Mother    Colon cancer Mother 34   Heart attack Father 81   Dementia Sister    Dementia Sister    Heart disease Brother 38       CAD wiht CABG   Lung cancer Brother        d. 65s   Renal cancer Brother    Pancreatic cancer  Brother        d. 106s   Lung cancer Brother    Lung cancer Brother    Lung cancer Brother    Heart disease Son 20       CHF   Heart attack Son    Brain cancer Maternal Uncle        d 6s   Renal cancer Paternal Uncle        d 60s   Breast cancer Cousin        dx 57s   Breast cancer Cousin        1 dx 30s-40s, 2 dx 76s   Lung cancer Half-Brother     SOCIAL HISTORY: Social History   Socioeconomic History   Marital status: Married    Spouse name: Lennette Bihari   Number of children: 1   Years of education: Not on file   Highest education level: Not on file  Occupational History   Not on file  Tobacco Use   Smoking status: Former    Types: Cigarettes    Quit date: 09/23/2007    Years since quitting: 14.8   Smokeless tobacco: Never  Substance and Sexual Activity   Alcohol use: Not on file   Drug use: Not on  file   Sexual activity: Not on file  Other Topics Concern   Not on file  Social History Narrative   Lives with husband   Social Determinants of Health   Financial Resource Strain: Not on file  Food Insecurity: Not on file  Transportation Needs: Not on file  Physical Activity: Not on file  Stress: Not on file  Social Connections: Not on file  Intimate Partner Violence: Not on file     PHYSICAL EXAM  GENERAL EXAM/CONSTITUTIONAL: Vitals:  Vitals:   07/21/22 0907  BP: 127/73  Pulse: 78  Weight: 191 lb 4 oz (86.8 kg)  Height: '5\' 8"'$  (1.727 m)   Body mass index is 29.08 kg/m. Wt Readings from Last 3 Encounters:  07/21/22 191 lb 4 oz (86.8 kg)  06/02/22 188 lb 12.8 oz (85.6 kg)  03/19/22 191 lb 8 oz (86.9 kg)   Patient is in no distress; well developed, nourished and groomed; neck is supple  CARDIOVASCULAR: Examination of carotid arteries is normal; no carotid bruits Regular rate and rhythm, no murmurs Examination of peripheral vascular system by observation and palpation is normal  EYES: Ophthalmoscopic exam of optic discs and posterior segments is normal; no papilledema or hemorrhages No results found.  MUSCULOSKELETAL: Gait, strength, tone, movements noted in Neurologic exam below  NEUROLOGIC: MENTAL STATUS:     07/21/2022    9:09 AM  MMSE - Mini Mental State Exam  Orientation to time 5  Orientation to Place 5  Registration 3  Attention/ Calculation 3  Recall 2  Language- name 2 objects 2  Language- repeat 1  Language- follow 3 step command 3  Language- read & follow direction 1  Write a sentence 1  Copy design 1  Total score 27   awake, alert, oriented to person, place and time recent and remote memory intact normal attention and concentration language fluent, comprehension intact, naming intact fund of knowledge appropriate  CRANIAL NERVE:  2nd - no papilledema on fundoscopic exam 2nd, 3rd, 4th, 6th - pupils equal and reactive to light,  visual fields full to confrontation, extraocular muscles intact, no nystagmus 5th - facial sensation symmetric 7th - facial strength symmetric 8th - hearing intact 9th - palate elevates symmetrically, uvula  midline 11th - shoulder shrug symmetric 12th - tongue protrusion midline  MOTOR:  normal bulk and tone, full strength in the BUE, BLE  SENSORY:  normal and symmetric to light touch, temperature, vibration  COORDINATION:  finger-nose-finger, fine finger movements normal  REFLEXES:  deep tendon reflexes TRACE and symmetric  GAIT/STATION:  narrow based gait     DIAGNOSTIC DATA (LABS, IMAGING, TESTING) - I reviewed patient records, labs, notes, testing and imaging myself where available.  No results found for: "WBC", "HGB", "HCT", "MCV", "PLT" No results found for: "NA", "K", "CL", "CO2", "GLUCOSE", "BUN", "CREATININE", "CALCIUM", "PROT", "ALBUMIN", "AST", "ALT", "ALKPHOS", "BILITOT", "GFRNONAA", "GFRAA" No results found for: "CHOL", "HDL", "LDLCALC", "LDLDIRECT", "TRIG", "CHOLHDL" No results found for: "HGBA1C" No results found for: "VITAMINB12" No results found for: "TSH"     ASSESSMENT AND PLAN  66 y.o. year old female here with:   Dx:  1. Family history of dementia      PLAN:  FAMILY HISTORY OF DEMENTIA - no symptoms currently; monitor for now - continue daily physical activity / exercise - eat more plants / vegetables - increase social activities, brain stimulation, games, puzzles, hobbies, crafts, arts, music - aim for at least 7-8 hours sleep per night (or more)  Return for return to PCP, pending if symptoms worsen or fail to improve.    Penni Bombard, MD 08/67/6195, 09:32 AM Certified in Neurology, Neurophysiology and Neuroimaging  [facility] [address] [place], [place] [address] [phone]

## 2022-07-22 DIAGNOSIS — J301 Allergic rhinitis due to pollen: Secondary | ICD-10-CM | POA: Diagnosis not present

## 2022-07-25 DIAGNOSIS — E782 Mixed hyperlipidemia: Secondary | ICD-10-CM | POA: Diagnosis not present

## 2022-07-25 DIAGNOSIS — J301 Allergic rhinitis due to pollen: Secondary | ICD-10-CM | POA: Diagnosis not present

## 2022-07-25 DIAGNOSIS — E039 Hypothyroidism, unspecified: Secondary | ICD-10-CM | POA: Diagnosis not present

## 2022-07-29 DIAGNOSIS — J301 Allergic rhinitis due to pollen: Secondary | ICD-10-CM | POA: Diagnosis not present

## 2022-08-01 DIAGNOSIS — J301 Allergic rhinitis due to pollen: Secondary | ICD-10-CM | POA: Diagnosis not present

## 2022-08-05 DIAGNOSIS — J301 Allergic rhinitis due to pollen: Secondary | ICD-10-CM | POA: Diagnosis not present

## 2022-08-08 DIAGNOSIS — J301 Allergic rhinitis due to pollen: Secondary | ICD-10-CM | POA: Diagnosis not present

## 2022-08-19 DIAGNOSIS — J029 Acute pharyngitis, unspecified: Secondary | ICD-10-CM | POA: Diagnosis not present

## 2022-08-19 DIAGNOSIS — G4733 Obstructive sleep apnea (adult) (pediatric): Secondary | ICD-10-CM | POA: Diagnosis not present

## 2022-08-19 DIAGNOSIS — A491 Streptococcal infection, unspecified site: Secondary | ICD-10-CM | POA: Diagnosis not present

## 2022-08-19 DIAGNOSIS — Z87891 Personal history of nicotine dependence: Secondary | ICD-10-CM | POA: Diagnosis not present

## 2022-08-19 DIAGNOSIS — J301 Allergic rhinitis due to pollen: Secondary | ICD-10-CM | POA: Diagnosis not present

## 2022-08-20 ENCOUNTER — Telehealth (INDEPENDENT_AMBULATORY_CARE_PROVIDER_SITE_OTHER): Payer: Medicare HMO | Admitting: Internal Medicine

## 2022-08-20 DIAGNOSIS — J020 Streptococcal pharyngitis: Secondary | ICD-10-CM | POA: Insufficient documentation

## 2022-08-20 DIAGNOSIS — U071 COVID-19: Secondary | ICD-10-CM | POA: Diagnosis not present

## 2022-08-20 MED ORDER — PAXLOVID (300/100) 20 X 150 MG & 10 X 100MG PO TBPK
1.0000 | ORAL_TABLET | Freq: Two times a day (BID) | ORAL | 0 refills | Status: DC
Start: 2022-08-20 — End: 2022-10-06

## 2022-08-20 NOTE — Progress Notes (Signed)
   Established Patient Office Visit  Subjective   Patient ID: Haley Beasley, female    DOB: 1956/04/27  Age: 66 y.o. MRN: [id number]  No chief complaint on file.   HPI 66 years old female with history of hypertension who has sore throat and stuffy nose and went to urgent care yesterday where she was diagnosed with strep throat.  He was started on Zithromax 250 mg daily for 5 days.  She tested herself for COVID at home because of body ache and low-grade fever and her COVID test is also positive.  He denies any chest pain she feels body pain and leg pain and has low-grade fever 99 degrees at home.   Review of Systems  Constitutional: Negative.   HENT: Negative.    Respiratory: Negative.    Cardiovascular: Negative.   Gastrointestinal: Negative.       Objective:     There were no vitals taken for this visit.   Physical Exam  AS this is a telephone visit as her phone could not do tele health visit. So no physical exam was done.   No results found for any visits on 08/20/22.    The ASCVD Risk score (Arnett DK, et al., 2019) failed to calculate for the following reasons:   Cannot find a previous HDL lab   Cannot find a previous total cholesterol lab    Assessment & Plan:   Problem List Items Addressed This Visit       Respiratory   Strep throat     Other   COVID-19 - Primary    No follow-ups on file.    Garwin Brothers, MD

## 2022-08-20 NOTE — Addendum Note (Signed)
Addended byGarwin Brothers on: 08/20/2022 12:34 PM   Modules accepted: Orders

## 2022-09-02 DIAGNOSIS — J301 Allergic rhinitis due to pollen: Secondary | ICD-10-CM | POA: Diagnosis not present

## 2022-09-03 ENCOUNTER — Other Ambulatory Visit: Payer: Self-pay

## 2022-09-03 MED ORDER — PANTOPRAZOLE SODIUM 40 MG PO TBEC: 40.0000 mg | DELAYED_RELEASE_TABLET | Freq: Every day | ORAL | 0 refills | Status: DC

## 2022-09-04 DIAGNOSIS — M810 Age-related osteoporosis without current pathological fracture: Secondary | ICD-10-CM | POA: Diagnosis not present

## 2022-09-05 DIAGNOSIS — J301 Allergic rhinitis due to pollen: Secondary | ICD-10-CM | POA: Diagnosis not present

## 2022-09-09 ENCOUNTER — Other Ambulatory Visit: Payer: Self-pay

## 2022-09-09 DIAGNOSIS — J301 Allergic rhinitis due to pollen: Secondary | ICD-10-CM | POA: Diagnosis not present

## 2022-09-09 DIAGNOSIS — Z87891 Personal history of nicotine dependence: Secondary | ICD-10-CM | POA: Diagnosis not present

## 2022-09-09 DIAGNOSIS — J019 Acute sinusitis, unspecified: Secondary | ICD-10-CM | POA: Diagnosis not present

## 2022-09-09 DIAGNOSIS — G4733 Obstructive sleep apnea (adult) (pediatric): Secondary | ICD-10-CM | POA: Diagnosis not present

## 2022-09-09 MED ORDER — SYNTHROID 75 MCG PO TABS: 75.0000 ug | ORAL_TABLET | Freq: Every day | ORAL | 0 refills | Status: DC

## 2022-09-10 ENCOUNTER — Other Ambulatory Visit: Payer: Self-pay

## 2022-09-10 MED ORDER — ALENDRONATE SODIUM 70 MG PO TABS: 70.0000 mg | ORAL_TABLET | ORAL | 11 refills | Status: DC

## 2022-09-10 NOTE — Progress Notes (Signed)
We received Bone Density report from [facility]. Per Dr. Nona Dell pt has osteoporosis. Dr. Nona Dell ordered for pt to start on Alendronate '70mg'$  once weekly and start TOC calcium and Vit. D. Pt informed to take make sure to remain upright after dose and to make sure to drink a full glass of water. Pt states that OTC supplements cause constipation. Per Dr. Nona Dell, pt can just do Vit. D for now since calcium will cause constipation.

## 2022-09-19 DIAGNOSIS — J301 Allergic rhinitis due to pollen: Secondary | ICD-10-CM | POA: Diagnosis not present

## 2022-09-23 DIAGNOSIS — J301 Allergic rhinitis due to pollen: Secondary | ICD-10-CM | POA: Diagnosis not present

## 2022-09-26 DIAGNOSIS — J301 Allergic rhinitis due to pollen: Secondary | ICD-10-CM | POA: Diagnosis not present

## 2022-09-30 DIAGNOSIS — J301 Allergic rhinitis due to pollen: Secondary | ICD-10-CM | POA: Diagnosis not present

## 2022-10-03 DIAGNOSIS — J301 Allergic rhinitis due to pollen: Secondary | ICD-10-CM | POA: Diagnosis not present

## 2022-10-06 ENCOUNTER — Encounter: Payer: Self-pay | Admitting: Internal Medicine

## 2022-10-06 ENCOUNTER — Ambulatory Visit: Payer: Medicare HMO | Admitting: Internal Medicine

## 2022-10-06 VITALS — BP 124/82 | HR 84 | Temp 97.6°F | Resp 16 | Ht 68.0 in | Wt 192.8 lb

## 2022-10-06 DIAGNOSIS — M25551 Pain in right hip: Secondary | ICD-10-CM | POA: Diagnosis not present

## 2022-10-06 DIAGNOSIS — M25559 Pain in unspecified hip: Secondary | ICD-10-CM | POA: Insufficient documentation

## 2022-10-06 DIAGNOSIS — M810 Age-related osteoporosis without current pathological fracture: Secondary | ICD-10-CM

## 2022-10-06 DIAGNOSIS — M25552 Pain in left hip: Secondary | ICD-10-CM | POA: Diagnosis not present

## 2022-10-06 NOTE — Progress Notes (Signed)
Office Visit  Subjective   Patient ID: Haley Beasley   DOB: 1956-08-04   Age: 67 y.o.   MRN: [id number]   Chief Complaint Chief Complaint  Patient presents with   Follow-up    Hypertension     History of Present Illness The patient is a 67 year old female , post-menopausal, who presents for followup of her osteoporosis. She tells me that her last bone denisity in [place] showed she had osteopenia. We did a bone density on 09/04/2022 which showed a lumbar spine t-score of -2.6 where she was diagnosed with osteoporosis. I asked her to start on aldendronate '70mg'$  weekly which she states her reflux has worsened. She has no specific complaints related to bone loss. She reports a family history of osteoporosis. The following risk factors are noted: history of tobacco abuse (quit in 2009), She denies the following: diabetes mellitus, high caffeine intake, alcohol consumption of more that 7 ounces per week, daily prednisone use, hyperthyroidism, surgical resection of her bowel, and surgical resection of her stomach. She states she exercises routinely with walking and biking. I also asked her to start on calcium and Vit D but the patient states she gets severe constipation from calcium and even VIt D supplementation.   The patient also states she began having right sided hip pain about a week ago.  She denies any trauma or falls.  She is having a dull continuous aching pain rated moderate 5 out 10 on the pain scale.  She has never had pain in her hip in the past.  She has never had a xray of her hip.  The patient has been taking alleve.  She does not have chronic kidney disease.         Past Medical History Past Medical History:  Diagnosis Date   Anxiety    Breast cancer ([place])    Depression    GERD (gastroesophageal reflux disease)    Hypertension    Memory loss    Sleep apnea    Thyroid disease      Allergies Allergies  Allergen Reactions   Fluogen [Influenza Virus Vaccine]     Penicillins      Medications  Current Outpatient Medications:    alendronate (FOSAMAX) 70 MG tablet, Take 1 tablet (70 mg total) by mouth every 7 (seven) days. Take with a full glass of water on an empty stomach., Disp: 4 tablet, Rfl: 11   ALPRAZolam (XANAX) 0.25 MG tablet, Take 0.25 mg by mouth as needed., Disp: , Rfl:    Evolocumab (REPATHA SURECLICK [place]), Inject into the skin., Disp: , Rfl:    Evolocumab (REPATHA SURECLICK) 676 MG/ML SOAJ, Inject 140 mg into the skin every 14 (fourteen) days., Disp: 2 mL, Rfl: 12   ezetimibe (ZETIA) 10 MG tablet, ezetimibe 10 mg tablet (Patient not taking: Reported on 07/21/2022), Disp: , Rfl:    fluticasone (FLONASE) 50 MCG/ACT nasal spray, , Disp: , Rfl:    losartan (COZAAR) 25 MG tablet, losartan 25 mg tablet, Disp: , Rfl:    pantoprazole (PROTONIX) 40 MG tablet, Take 1 tablet (40 mg total) by mouth daily., Disp: 90 tablet, Rfl: 0   SYNTHROID 75 MCG tablet, Take 1 tablet (75 mcg total) by mouth daily., Disp: 60 tablet, Rfl: 0  Current Facility-Administered Medications:    sodium chloride flush (NS) 0.9 % injection 3 mL, 3 mL, Intravenous, Q12H, [place], Peter M, MD   Review of Systems Review of Systems  Constitutional:  Negative for chills  and fever.  Eyes:  Negative for blurred vision and double vision.  Respiratory:  Negative for cough and shortness of breath.   Cardiovascular:  Negative for chest pain, palpitations and leg swelling.  Gastrointestinal:  Positive for diarrhea and heartburn. Negative for abdominal pain, blood in stool, constipation, melena, nausea and vomiting.  Musculoskeletal:  Negative for myalgias.  Skin:  Negative for itching and rash.  Neurological:  Negative for dizziness, weakness and headaches.       Objective:    Vitals BP 124/82   Pulse 84   Temp 97.6 F (36.4 C)   Resp 16   Ht '5\' 8"'$  (1.727 m)   Wt 192 lb 12.8 oz (87.5 kg)   SpO2 98%   BMI 29.32 kg/m    Physical Examination Physical Exam Constitutional:       Appearance: Normal appearance. She is not ill-appearing.  Cardiovascular:     Rate and Rhythm: Normal rate and regular rhythm.     Pulses: Normal pulses.     Heart sounds: No murmur heard.    No friction rub. No gallop.  Pulmonary:     Effort: Pulmonary effort is normal. No respiratory distress.     Breath sounds: No wheezing, rhonchi or rales.  Abdominal:     General: Abdomen is flat. Bowel sounds are normal. There is no distension.     Palpations: Abdomen is soft.     Tenderness: There is no abdominal tenderness.  Musculoskeletal:     Right lower leg: No edema.     Left lower leg: No edema.  Skin:    General: Skin is warm and dry.     Findings: No rash.  Neurological:     Mental Status: She is alert.        Assessment & Plan:   Age-related osteoporosis without current pathological fracture We discussed treatment modalities for her osteoporosis.  I want her to stop the alendronate as it is causing worsening reflux.  We discussed prolia vs. Reclast and possible side effects.  She would like to set up for iv reclast.    Hip pain She may have a burisitis going on as she is having pain over her right greater trochanter area of her hip.  She has no pain upon movement of her hip.  We will get a xray of this area.  She may need an NSAID and if this does not help, we will refer to ortho.    Return in about 3 months (around 01/05/2023).   Townsend Roger, MD

## 2022-10-06 NOTE — Assessment & Plan Note (Signed)
We discussed treatment modalities for her osteoporosis.  I want her to stop the alendronate as it is causing worsening reflux.  We discussed prolia vs. Reclast and possible side effects.  She would like to set up for iv reclast.

## 2022-10-06 NOTE — Assessment & Plan Note (Signed)
She may have a burisitis going on as she is having pain over her right greater trochanter area of her hip.  She has no pain upon movement of her hip.  We will get a xray of this area.  She may need an NSAID and if this does not help, we will refer to ortho.

## 2022-10-07 DIAGNOSIS — J301 Allergic rhinitis due to pollen: Secondary | ICD-10-CM | POA: Diagnosis not present

## 2022-10-07 LAB — CMP14 + ANION GAP
ALT: 22 IU/L (ref 0–32)
AST: 21 IU/L (ref 0–40)
Albumin/Globulin Ratio: 1.7 (ref 1.2–2.2)
Albumin: 4.6 g/dL (ref 3.9–4.9)
Alkaline Phosphatase: 75 IU/L (ref 44–121)
Anion Gap: 19 mmol/L — ABNORMAL HIGH (ref 10.0–18.0)
BUN/Creatinine Ratio: 19 (ref 12–28)
BUN: 14 mg/dL (ref 8–27)
Bilirubin Total: 0.2 mg/dL (ref 0.0–1.2)
CO2: 19 mmol/L — ABNORMAL LOW (ref 20–29)
Calcium: 9.7 mg/dL (ref 8.7–10.3)
Chloride: 101 mmol/L (ref 96–106)
Creatinine, Ser: 0.73 mg/dL (ref 0.57–1.00)
Globulin, Total: 2.7 g/dL (ref 1.5–4.5)
Glucose: 104 mg/dL — ABNORMAL HIGH (ref 70–99)
Potassium: 4.4 mmol/L (ref 3.5–5.2)
Sodium: 139 mmol/L (ref 134–144)
Total Protein: 7.3 g/dL (ref 6.0–8.5)
eGFR: 90 mL/min/{1.73_m2} (ref >59)

## 2022-10-10 ENCOUNTER — Other Ambulatory Visit: Payer: Self-pay

## 2022-10-10 MED ORDER — DICLOFENAC SODIUM 75 MG PO TBEC: 75.0000 mg | DELAYED_RELEASE_TABLET | Freq: Two times a day (BID) | ORAL | 0 refills | Status: AC

## 2022-10-14 DIAGNOSIS — J301 Allergic rhinitis due to pollen: Secondary | ICD-10-CM | POA: Diagnosis not present

## 2022-10-16 DIAGNOSIS — M7061 Trochanteric bursitis, right hip: Secondary | ICD-10-CM | POA: Diagnosis not present

## 2022-10-17 DIAGNOSIS — J301 Allergic rhinitis due to pollen: Secondary | ICD-10-CM | POA: Diagnosis not present

## 2022-10-20 NOTE — Progress Notes (Signed)
Patient called.  Unable to reach patient.Will mail letter and have her contact us.   Labs look good for her osteoporosis meds.

## 2022-10-21 DIAGNOSIS — J301 Allergic rhinitis due to pollen: Secondary | ICD-10-CM | POA: Diagnosis not present

## 2022-10-22 DIAGNOSIS — Z0100 Encounter for examination of eyes and vision without abnormal findings: Secondary | ICD-10-CM | POA: Diagnosis not present

## 2022-10-22 DIAGNOSIS — H5212 Myopia, left eye: Secondary | ICD-10-CM | POA: Diagnosis not present

## 2022-10-22 DIAGNOSIS — Z9849 Cataract extraction status, unspecified eye: Secondary | ICD-10-CM | POA: Diagnosis not present

## 2022-10-22 DIAGNOSIS — H524 Presbyopia: Secondary | ICD-10-CM | POA: Diagnosis not present

## 2022-10-22 DIAGNOSIS — H52223 Regular astigmatism, bilateral: Secondary | ICD-10-CM | POA: Diagnosis not present

## 2022-10-22 DIAGNOSIS — Z961 Presence of intraocular lens: Secondary | ICD-10-CM | POA: Diagnosis not present

## 2022-10-24 DIAGNOSIS — J301 Allergic rhinitis due to pollen: Secondary | ICD-10-CM | POA: Diagnosis not present

## 2022-10-29 DIAGNOSIS — M810 Age-related osteoporosis without current pathological fracture: Secondary | ICD-10-CM | POA: Diagnosis not present

## 2022-10-31 DIAGNOSIS — J301 Allergic rhinitis due to pollen: Secondary | ICD-10-CM | POA: Diagnosis not present

## 2022-11-04 ENCOUNTER — Encounter: Payer: Self-pay | Admitting: Pharmacist Clinician (PhC)/ Clinical Pharmacy Specialist

## 2022-11-04 DIAGNOSIS — J301 Allergic rhinitis due to pollen: Secondary | ICD-10-CM | POA: Diagnosis not present

## 2022-11-04 DIAGNOSIS — E785 Hyperlipidemia, unspecified: Secondary | ICD-10-CM

## 2022-11-05 DIAGNOSIS — E785 Hyperlipidemia, unspecified: Secondary | ICD-10-CM | POA: Diagnosis not present

## 2022-11-05 LAB — LIPID PANEL
Chol/HDL Ratio: 2.6 ratio (ref 0.0–4.4)
Cholesterol, Total: 200 mg/dL — ABNORMAL HIGH (ref 100–199)
HDL: 76 mg/dL (ref >39)
LDL Chol Calc (NIH): 109 mg/dL — ABNORMAL HIGH (ref 0–99)
Triglycerides: 87 mg/dL (ref 0–149)
VLDL Cholesterol Cal: 15 mg/dL (ref 5–40)

## 2022-11-05 LAB — HEPATIC FUNCTION PANEL
ALT: 28 IU/L (ref 0–32)
AST: 21 IU/L (ref 0–40)
Albumin: 4.6 g/dL (ref 3.9–4.9)
Alkaline Phosphatase: 81 IU/L (ref 44–121)
Bilirubin Total: 0.3 mg/dL (ref 0.0–1.2)
Bilirubin, Direct: <0.1 mg/dL (ref 0.00–0.40)
Total Protein: 7.4 g/dL (ref 6.0–8.5)

## 2022-11-07 DIAGNOSIS — J301 Allergic rhinitis due to pollen: Secondary | ICD-10-CM | POA: Diagnosis not present

## 2022-11-12 ENCOUNTER — Other Ambulatory Visit: Payer: Self-pay | Admitting: Internal Medicine

## 2022-11-13 ENCOUNTER — Other Ambulatory Visit: Payer: Self-pay | Admitting: Internal Medicine

## 2022-11-14 DIAGNOSIS — J301 Allergic rhinitis due to pollen: Secondary | ICD-10-CM | POA: Diagnosis not present

## 2022-11-18 ENCOUNTER — Other Ambulatory Visit: Payer: Self-pay

## 2022-11-18 MED ORDER — SYNTHROID 75 MCG PO TABS: ORAL_TABLET | ORAL | 1 refills | Status: DC

## 2022-11-19 DIAGNOSIS — J301 Allergic rhinitis due to pollen: Secondary | ICD-10-CM | POA: Diagnosis not present

## 2022-11-21 ENCOUNTER — Other Ambulatory Visit: Payer: Self-pay | Admitting: Internal Medicine

## 2022-11-21 DIAGNOSIS — J301 Allergic rhinitis due to pollen: Secondary | ICD-10-CM | POA: Diagnosis not present

## 2022-11-25 DIAGNOSIS — J301 Allergic rhinitis due to pollen: Secondary | ICD-10-CM | POA: Diagnosis not present

## 2022-11-28 DIAGNOSIS — J301 Allergic rhinitis due to pollen: Secondary | ICD-10-CM | POA: Diagnosis not present

## 2022-12-02 DIAGNOSIS — J301 Allergic rhinitis due to pollen: Secondary | ICD-10-CM | POA: Diagnosis not present

## 2022-12-04 ENCOUNTER — Other Ambulatory Visit: Payer: Self-pay

## 2022-12-04 MED ORDER — FLUTICASONE PROPIONATE 50 MCG/ACT NA SUSP: 1.0000 | Freq: Every day | NASAL | 3 refills | Status: DC

## 2022-12-05 DIAGNOSIS — J301 Allergic rhinitis due to pollen: Secondary | ICD-10-CM | POA: Diagnosis not present

## 2022-12-09 DIAGNOSIS — J301 Allergic rhinitis due to pollen: Secondary | ICD-10-CM | POA: Diagnosis not present

## 2022-12-12 DIAGNOSIS — J301 Allergic rhinitis due to pollen: Secondary | ICD-10-CM | POA: Diagnosis not present

## 2022-12-16 DIAGNOSIS — G4733 Obstructive sleep apnea (adult) (pediatric): Secondary | ICD-10-CM | POA: Diagnosis not present

## 2022-12-16 DIAGNOSIS — G4700 Insomnia, unspecified: Secondary | ICD-10-CM | POA: Diagnosis not present

## 2022-12-16 DIAGNOSIS — J301 Allergic rhinitis due to pollen: Secondary | ICD-10-CM | POA: Diagnosis not present

## 2022-12-16 DIAGNOSIS — Z87891 Personal history of nicotine dependence: Secondary | ICD-10-CM | POA: Diagnosis not present

## 2022-12-16 DIAGNOSIS — R0600 Dyspnea, unspecified: Secondary | ICD-10-CM | POA: Diagnosis not present

## 2022-12-23 DIAGNOSIS — J301 Allergic rhinitis due to pollen: Secondary | ICD-10-CM | POA: Diagnosis not present

## 2022-12-24 DIAGNOSIS — J301 Allergic rhinitis due to pollen: Secondary | ICD-10-CM | POA: Diagnosis not present

## 2022-12-26 DIAGNOSIS — J301 Allergic rhinitis due to pollen: Secondary | ICD-10-CM | POA: Diagnosis not present

## 2023-01-06 DIAGNOSIS — J301 Allergic rhinitis due to pollen: Secondary | ICD-10-CM | POA: Diagnosis not present

## 2023-01-07 ENCOUNTER — Ambulatory Visit: Payer: Medicare HMO | Admitting: Internal Medicine

## 2023-01-07 ENCOUNTER — Encounter: Payer: Self-pay | Admitting: Internal Medicine

## 2023-01-07 VITALS — BP 122/74 | HR 78 | Temp 97.5°F | Resp 16 | Ht 67.0 in | Wt 193.0 lb

## 2023-01-07 DIAGNOSIS — E039 Hypothyroidism, unspecified: Secondary | ICD-10-CM | POA: Diagnosis not present

## 2023-01-07 DIAGNOSIS — M810 Age-related osteoporosis without current pathological fracture: Secondary | ICD-10-CM | POA: Diagnosis not present

## 2023-01-07 DIAGNOSIS — I10 Essential (primary) hypertension: Secondary | ICD-10-CM | POA: Diagnosis not present

## 2023-01-07 NOTE — Progress Notes (Signed)
Office Visit  Subjective   Patient ID: Haley Beasley   DOB: [birth year]   Age: 67 y.o.   MRN: [id number]   Chief Complaint Chief Complaint  Patient presents with   Follow-up     History of Present Illness The patient is a 67 year old female who returns for a regularly scheduled thyroid check.  She was diagnosed with hypothyroidism around 1997.   She had a known goiter at that time with abnormal TFT's and she had her thyroid removed in 1999.  She has been on replacement thyroid since then.  Since the last visit in 07/2022,  there has been no overall change in her status. She remains on levothyroxine po daily.  She claims to have no symptoms suggestive of thyroid imbalance specifically denying fatigue, cold intolerance, heat intolerance, tremors, anxiety, unexplained weight changes, insomnia,but she has fluctuations of diarrhea and constipation and she does complain of dry skin.    The patient is a 67 year old female who presents for a follow-up evaluation of hypertension.  She was diagnosed with HTN in 2020.  The patient has been checking her blood pressure at home.  She states her systolic BP is in the 120's.  The patient's current medications include: losartan 25 mg. The patient has been tolerating her medications well. The patient denies any visual changes, dizziness, headaches, chest pain, lightheadness, palpitations, generalized weakness or edema. She reports there have been no other symptoms noted.   The patient is a 67 year old female , post-menopausal, who presents for followup of her osteoporosis. She tells me that her last bone denisity in [place] showed she had osteopenia. We did a bone density on 09/04/2022 which showed a lumbar spine t-score of -2.6 where she was diagnosed with osteoporosis. I asked her to start on aldendronate  weekly which she states her reflux has worsened. She has no specific complaints related to bone loss. She reports a family history of osteoporosis.  The following risk factors are noted: history of tobacco abuse (quit in 2009), She denies the following: diabetes mellitus, high caffeine intake, alcohol consumption of more that 7 ounces per week, daily prednisone use, hyperthyroidism, surgical resection of her bowel, and surgical resection of her stomach. She states she exercises routinely with walking and biking. I also asked her to start on calcium and Vit D but the patient states she gets severe constipation from calcium and even VIt D supplementation.  I did discuss medications with her and I wanted her to stop the alendronate as it is causing worsening reflux.  We discussed prolia vs. Reclast and possible side effects.  We set her up for iv reclast which was done on 10/29/2022.         Past Medical History Past Medical History:  Diagnosis Date   Anxiety    Breast cancer    Depression    GERD (gastroesophageal reflux disease)    Hypertension    Memory loss    Sleep apnea    Thyroid disease      Allergies Allergies  Allergen Reactions   Fluogen [Influenza Virus Vaccine]    Penicillins      Medications  Current Outpatient Medications:    ALPRAZolam (XANAX) 0.25 MG tablet, Take 0.25 mg by mouth as needed., Disp: , Rfl:    Evolocumab (REPATHA SURECLICK [place]), Inject into the skin., Disp: , Rfl:    Evolocumab (REPATHA SURECLICK) 140 MG/ML SOAJ, Inject 140 mg into the skin every 14 (fourteen) days.,  Disp: 2 mL, Rfl: 12   ezetimibe (ZETIA) 10 MG tablet, ezetimibe 10 mg tablet (Patient not taking: Reported on 07/21/2022), Disp: , Rfl:    fluticasone (FLONASE) 50 MCG/ACT nasal spray, Place 1 spray into both nostrils daily., Disp: 16 g, Rfl: 3   losartan (COZAAR) 25 MG tablet, losartan 25 mg tablet, Disp: , Rfl:    pantoprazole (PROTONIX) 40 MG tablet, TAKE 1 TABLET(40 MG) BY MOUTH DAILY, Disp: 90 tablet, Rfl: 0   SYNTHROID 75 MCG tablet, TAKE 1 TABLET(75 MCG) BY MOUTH DAILY, Disp: 90 tablet, Rfl: 1  Current Facility-Administered  Medications:    sodium chloride flush (NS) 0.9 % injection 3 mL, 3 mL, Intravenous, Q12H, [place], Peter M, MD   Review of Systems Review of Systems  Constitutional:  Negative for chills, fever, malaise/fatigue and weight loss.  Eyes:  Negative for blurred vision and double vision.  Respiratory:  Negative for cough and shortness of breath.   Cardiovascular:  Negative for chest pain, palpitations and leg swelling.  Gastrointestinal:  Positive for constipation and diarrhea. Negative for abdominal pain, nausea and vomiting.  Musculoskeletal:  Negative for myalgias.  Skin:  Negative for itching and rash.  Neurological:  Negative for dizziness, weakness and headaches.  Psychiatric/Behavioral:  Positive for depression. The patient is nervous/anxious.        No worsening depression or anxiety.       Objective:    Vitals BP 122/74 (BP Location: Right Arm, Patient Position: Sitting, Cuff Size: Normal)   Pulse 78   Temp (!) 97.5 F (36.4 C) (Temporal)   Resp 16   Ht  (1.702 m)   Wt 193 lb (87.5 kg)   SpO2 98%   BMI 30.23 kg/m    Physical Examination Physical Exam Constitutional:      Appearance: Normal appearance. She is not ill-appearing.  Neck:     Thyroid: No thyroid mass, thyromegaly or thyroid tenderness.  Cardiovascular:     Rate and Rhythm: Normal rate and regular rhythm.     Pulses: Normal pulses.     Heart sounds: No murmur heard.    No friction rub. No gallop.  Pulmonary:     Effort: Pulmonary effort is normal. No respiratory distress.     Breath sounds: No wheezing, rhonchi or rales.  Abdominal:     General: Bowel sounds are normal. There is no distension.     Palpations: Abdomen is soft.     Tenderness: There is no abdominal tenderness.  Musculoskeletal:     Right lower leg: No edema.     Left lower leg: No edema.  Lymphadenopathy:     Cervical: No cervical adenopathy.  Skin:    General: Skin is warm and dry.     Findings: No rash.  Neurological:      Mental Status: She is alert.        Assessment & Plan:   Essential hypertension Her BP is well controlled.  We will cotinue on her losartan at this time.  Hypothyroidism (acquired) She seems euthyroid.  We will check her TFT's today.  Age-related osteoporosis without current pathological fracture I want her to exercise and continue to eat healthy.  She does not take calcium or Vit D due to it causing constipation.  She will get another IV reclast injection next year.    Return in about 4 months (around 05/09/2023) for annual.   Crist Fat, MD

## 2023-01-07 NOTE — Assessment & Plan Note (Signed)
I want her to exercise and continue to eat healthy.  She does not take calcium or Vit D due to it causing constipation.  She will get another IV reclast injection next year.

## 2023-01-07 NOTE — Assessment & Plan Note (Signed)
Her BP is well controlled.  We will cotinue on her losartan at this time.

## 2023-01-07 NOTE — Assessment & Plan Note (Signed)
She seems euthyroid.  We will check her TFT's today. 

## 2023-01-08 DIAGNOSIS — G4733 Obstructive sleep apnea (adult) (pediatric): Secondary | ICD-10-CM | POA: Diagnosis not present

## 2023-01-08 LAB — T4, FREE: Free T4: 1.48 ng/dL (ref 0.82–1.77)

## 2023-01-08 LAB — TSH: TSH: 2.43 u[IU]/mL (ref 0.450–4.500)

## 2023-01-14 DIAGNOSIS — J301 Allergic rhinitis due to pollen: Secondary | ICD-10-CM | POA: Diagnosis not present

## 2023-01-14 DIAGNOSIS — G4733 Obstructive sleep apnea (adult) (pediatric): Secondary | ICD-10-CM | POA: Diagnosis not present

## 2023-01-14 DIAGNOSIS — G4700 Insomnia, unspecified: Secondary | ICD-10-CM | POA: Diagnosis not present

## 2023-01-14 DIAGNOSIS — Z87891 Personal history of nicotine dependence: Secondary | ICD-10-CM | POA: Diagnosis not present

## 2023-01-20 ENCOUNTER — Telehealth: Payer: Self-pay

## 2023-01-20 DIAGNOSIS — J301 Allergic rhinitis due to pollen: Secondary | ICD-10-CM | POA: Diagnosis not present

## 2023-01-20 NOTE — Telephone Encounter (Signed)
-----   Message from Crist Fat, MD sent at 01/18/2023  9:25 PM EDT ----- Her thyroid function test is normal.

## 2023-01-20 NOTE — Telephone Encounter (Signed)
Pt notified of lab results

## 2023-01-21 HISTORY — PX: ANKLE FRACTURE SURGERY: SHX122

## 2023-01-26 DIAGNOSIS — R0600 Dyspnea, unspecified: Secondary | ICD-10-CM | POA: Diagnosis not present

## 2023-01-30 ENCOUNTER — Other Ambulatory Visit: Payer: Self-pay | Admitting: Internal Medicine

## 2023-01-30 MED ORDER — ALPRAZOLAM 0.25 MG PO TABS: 0.2500 mg | ORAL_TABLET | Freq: Every day | ORAL | 2 refills | Status: DC | PRN

## 2023-02-04 DIAGNOSIS — G4700 Insomnia, unspecified: Secondary | ICD-10-CM | POA: Diagnosis not present

## 2023-02-04 DIAGNOSIS — Z87891 Personal history of nicotine dependence: Secondary | ICD-10-CM | POA: Diagnosis not present

## 2023-02-04 DIAGNOSIS — G4733 Obstructive sleep apnea (adult) (pediatric): Secondary | ICD-10-CM | POA: Diagnosis not present

## 2023-02-04 DIAGNOSIS — J301 Allergic rhinitis due to pollen: Secondary | ICD-10-CM | POA: Diagnosis not present

## 2023-02-07 DIAGNOSIS — S8261XA Displaced fracture of lateral malleolus of right fibula, initial encounter for closed fracture: Secondary | ICD-10-CM | POA: Diagnosis not present

## 2023-02-07 DIAGNOSIS — S8251XA Displaced fracture of medial malleolus of right tibia, initial encounter for closed fracture: Secondary | ICD-10-CM | POA: Diagnosis not present

## 2023-02-07 DIAGNOSIS — S82844A Nondisplaced bimalleolar fracture of right lower leg, initial encounter for closed fracture: Secondary | ICD-10-CM | POA: Diagnosis not present

## 2023-02-10 DIAGNOSIS — S82841A Displaced bimalleolar fracture of right lower leg, initial encounter for closed fracture: Secondary | ICD-10-CM | POA: Diagnosis not present

## 2023-02-13 DIAGNOSIS — G8918 Other acute postprocedural pain: Secondary | ICD-10-CM | POA: Diagnosis not present

## 2023-02-13 DIAGNOSIS — S82841A Displaced bimalleolar fracture of right lower leg, initial encounter for closed fracture: Secondary | ICD-10-CM | POA: Diagnosis not present

## 2023-02-13 DIAGNOSIS — S93431A Sprain of tibiofibular ligament of right ankle, initial encounter: Secondary | ICD-10-CM | POA: Diagnosis not present

## 2023-02-13 DIAGNOSIS — S82851A Displaced trimalleolar fracture of right lower leg, initial encounter for closed fracture: Secondary | ICD-10-CM | POA: Diagnosis not present

## 2023-02-20 DIAGNOSIS — J301 Allergic rhinitis due to pollen: Secondary | ICD-10-CM | POA: Diagnosis not present

## 2023-02-26 DIAGNOSIS — S82841D Displaced bimalleolar fracture of right lower leg, subsequent encounter for closed fracture with routine healing: Secondary | ICD-10-CM | POA: Diagnosis not present

## 2023-03-01 ENCOUNTER — Other Ambulatory Visit: Payer: Self-pay | Admitting: Internal Medicine

## 2023-03-03 DIAGNOSIS — J301 Allergic rhinitis due to pollen: Secondary | ICD-10-CM | POA: Diagnosis not present

## 2023-03-17 DIAGNOSIS — J301 Allergic rhinitis due to pollen: Secondary | ICD-10-CM | POA: Diagnosis not present

## 2023-03-31 DIAGNOSIS — S82841D Displaced bimalleolar fracture of right lower leg, subsequent encounter for closed fracture with routine healing: Secondary | ICD-10-CM | POA: Diagnosis not present

## 2023-03-31 DIAGNOSIS — J301 Allergic rhinitis due to pollen: Secondary | ICD-10-CM | POA: Diagnosis not present

## 2023-04-10 DIAGNOSIS — M25471 Effusion, right ankle: Secondary | ICD-10-CM | POA: Diagnosis not present

## 2023-04-10 DIAGNOSIS — M25571 Pain in right ankle and joints of right foot: Secondary | ICD-10-CM | POA: Diagnosis not present

## 2023-04-10 DIAGNOSIS — M25671 Stiffness of right ankle, not elsewhere classified: Secondary | ICD-10-CM | POA: Diagnosis not present

## 2023-04-13 DIAGNOSIS — M25471 Effusion, right ankle: Secondary | ICD-10-CM | POA: Diagnosis not present

## 2023-04-13 DIAGNOSIS — M25671 Stiffness of right ankle, not elsewhere classified: Secondary | ICD-10-CM | POA: Diagnosis not present

## 2023-04-13 DIAGNOSIS — M25571 Pain in right ankle and joints of right foot: Secondary | ICD-10-CM | POA: Diagnosis not present

## 2023-04-17 DIAGNOSIS — M25671 Stiffness of right ankle, not elsewhere classified: Secondary | ICD-10-CM | POA: Diagnosis not present

## 2023-04-17 DIAGNOSIS — M25471 Effusion, right ankle: Secondary | ICD-10-CM | POA: Diagnosis not present

## 2023-04-17 DIAGNOSIS — M25571 Pain in right ankle and joints of right foot: Secondary | ICD-10-CM | POA: Diagnosis not present

## 2023-04-20 DIAGNOSIS — M25671 Stiffness of right ankle, not elsewhere classified: Secondary | ICD-10-CM | POA: Diagnosis not present

## 2023-04-20 DIAGNOSIS — M25471 Effusion, right ankle: Secondary | ICD-10-CM | POA: Diagnosis not present

## 2023-04-20 DIAGNOSIS — M25571 Pain in right ankle and joints of right foot: Secondary | ICD-10-CM | POA: Diagnosis not present

## 2023-04-22 DIAGNOSIS — M25471 Effusion, right ankle: Secondary | ICD-10-CM | POA: Diagnosis not present

## 2023-04-22 DIAGNOSIS — M25671 Stiffness of right ankle, not elsewhere classified: Secondary | ICD-10-CM | POA: Diagnosis not present

## 2023-04-22 DIAGNOSIS — M25571 Pain in right ankle and joints of right foot: Secondary | ICD-10-CM | POA: Diagnosis not present

## 2023-04-27 DIAGNOSIS — M25471 Effusion, right ankle: Secondary | ICD-10-CM | POA: Diagnosis not present

## 2023-04-27 DIAGNOSIS — M25671 Stiffness of right ankle, not elsewhere classified: Secondary | ICD-10-CM | POA: Diagnosis not present

## 2023-04-27 DIAGNOSIS — M25571 Pain in right ankle and joints of right foot: Secondary | ICD-10-CM | POA: Diagnosis not present

## 2023-04-28 DIAGNOSIS — J301 Allergic rhinitis due to pollen: Secondary | ICD-10-CM | POA: Diagnosis not present

## 2023-04-29 DIAGNOSIS — M25471 Effusion, right ankle: Secondary | ICD-10-CM | POA: Diagnosis not present

## 2023-04-29 DIAGNOSIS — M25571 Pain in right ankle and joints of right foot: Secondary | ICD-10-CM | POA: Diagnosis not present

## 2023-04-29 DIAGNOSIS — M25671 Stiffness of right ankle, not elsewhere classified: Secondary | ICD-10-CM | POA: Diagnosis not present

## 2023-05-04 DIAGNOSIS — M25571 Pain in right ankle and joints of right foot: Secondary | ICD-10-CM | POA: Diagnosis not present

## 2023-05-04 DIAGNOSIS — M25671 Stiffness of right ankle, not elsewhere classified: Secondary | ICD-10-CM | POA: Diagnosis not present

## 2023-05-04 DIAGNOSIS — M25471 Effusion, right ankle: Secondary | ICD-10-CM | POA: Diagnosis not present

## 2023-05-06 DIAGNOSIS — M25671 Stiffness of right ankle, not elsewhere classified: Secondary | ICD-10-CM | POA: Diagnosis not present

## 2023-05-06 DIAGNOSIS — M25471 Effusion, right ankle: Secondary | ICD-10-CM | POA: Diagnosis not present

## 2023-05-06 DIAGNOSIS — M25571 Pain in right ankle and joints of right foot: Secondary | ICD-10-CM | POA: Diagnosis not present

## 2023-05-07 DIAGNOSIS — S82841D Displaced bimalleolar fracture of right lower leg, subsequent encounter for closed fracture with routine healing: Secondary | ICD-10-CM | POA: Diagnosis not present

## 2023-05-08 ENCOUNTER — Encounter: Payer: Self-pay | Admitting: Internal Medicine

## 2023-05-08 ENCOUNTER — Ambulatory Visit: Payer: Medicare HMO | Admitting: Internal Medicine

## 2023-05-08 VITALS — BP 122/80 | HR 92 | Temp 98.4°F | Resp 18 | Ht 68.0 in | Wt 195.0 lb

## 2023-05-08 DIAGNOSIS — F411 Generalized anxiety disorder: Secondary | ICD-10-CM

## 2023-05-08 DIAGNOSIS — E039 Hypothyroidism, unspecified: Secondary | ICD-10-CM

## 2023-05-08 DIAGNOSIS — E7800 Pure hypercholesterolemia, unspecified: Secondary | ICD-10-CM | POA: Diagnosis not present

## 2023-05-08 DIAGNOSIS — Z6829 Body mass index (BMI) 29.0-29.9, adult: Secondary | ICD-10-CM | POA: Diagnosis not present

## 2023-05-08 DIAGNOSIS — F331 Major depressive disorder, recurrent, moderate: Secondary | ICD-10-CM | POA: Insufficient documentation

## 2023-05-08 DIAGNOSIS — Z853 Personal history of malignant neoplasm of breast: Secondary | ICD-10-CM

## 2023-05-08 DIAGNOSIS — I872 Venous insufficiency (chronic) (peripheral): Secondary | ICD-10-CM

## 2023-05-08 DIAGNOSIS — M5136 Other intervertebral disc degeneration, lumbar region: Secondary | ICD-10-CM | POA: Diagnosis not present

## 2023-05-08 DIAGNOSIS — M8000XD Age-related osteoporosis with current pathological fracture, unspecified site, subsequent encounter for fracture with routine healing: Secondary | ICD-10-CM | POA: Diagnosis not present

## 2023-05-08 DIAGNOSIS — Z0000 Encounter for general adult medical examination without abnormal findings: Secondary | ICD-10-CM

## 2023-05-08 DIAGNOSIS — S82891D Other fracture of right lower leg, subsequent encounter for closed fracture with routine healing: Secondary | ICD-10-CM

## 2023-05-08 DIAGNOSIS — G4733 Obstructive sleep apnea (adult) (pediatric): Secondary | ICD-10-CM

## 2023-05-08 DIAGNOSIS — S82891A Other fracture of right lower leg, initial encounter for closed fracture: Secondary | ICD-10-CM | POA: Insufficient documentation

## 2023-05-08 DIAGNOSIS — J302 Other seasonal allergic rhinitis: Secondary | ICD-10-CM

## 2023-05-08 DIAGNOSIS — M8000XA Age-related osteoporosis with current pathological fracture, unspecified site, initial encounter for fracture: Secondary | ICD-10-CM | POA: Insufficient documentation

## 2023-05-08 DIAGNOSIS — M51369 Other intervertebral disc degeneration, lumbar region without mention of lumbar back pain or lower extremity pain: Secondary | ICD-10-CM

## 2023-05-08 DIAGNOSIS — I10 Essential (primary) hypertension: Secondary | ICD-10-CM

## 2023-05-08 MED ORDER — PNEUMOVAX 23 25 MCG/0.5ML IJ INJ: 0.5000 mL | INJECTION | INTRAMUSCULAR | 0 refills | Status: AC

## 2023-05-08 MED ORDER — SERTRALINE HCL 25 MG PO TABS: 25.0000 mg | ORAL_TABLET | Freq: Every day | ORAL | 3 refills | Status: AC

## 2023-05-08 NOTE — Progress Notes (Signed)
Preventive Screening-Counseling & Management     Haley Beasley is a 67 y.o. female who presents for Medicare Annual/Subsequent preventive examination.  The patient is a 67 year old female who has come to this office for her initial Annual Wellness Visit.  She is currently due for: screening labs.  Please see her Past Medical History section for current problems.  Her last eye exam was in 09/2022 and her vision is doing well.  She has a history of breast cancer which was diagnosed in 2006 and 2009 of her left breast.  She had bilateral mastectomy done and SNB.  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  She had a lumpectomy and radiation therapy at that time.  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  They placed her on herceptin which she completed.  She was being followed for screening with MRI of her breasts with last one done in 2020.  She moved to our area in 2021 and has not set up with an oncologist yet.  She had an EGD done on 01/14/2022 due to heart burn and dysphagia.  This showed a normal esophagus and a few gastric polyps.  Her last colonoscopy was done on 01/14/2022 and this showed one polyp at the rectum and sigmoid diverticulosis.  Today, she denies any problems with reflux or dysphagia.  She remains on protonix at this time.  The patient does not exercise.  The patient does not currently smoke.  She did smoke from age of 62 and quit in 2009.  She smoked <1 ppd.  The patient is allergic to flu vaccinations.  She had a prevnar 13 vaccine done in 04/2022.  She has had 3 COVID-19 vaccines including 2 boosters.  The patient denies any depression or memory loss.  There is a family of CAD in her father who had one in his 9's and she had one brother with CAD in his 52's as well.  There is no history of strokes.  The patient is on an ASA 81mg  daily.  The patient was concerned this past  year about risk for dementia where she has 3 sisters with dementia with one with alzheimers and another with Lewy Body Dementia.  She went to see neurology in 06/2022 and they did not note any problems but they recommend risk factor modification at that time.  She scored a 30/30 on her MMSE in 2023 and today on 05/08/2023 she scored a 30/30.  The patient also has a history of hypothyroidism.  She was diagnosed with hypothyroidism around 1997.   She had a known goiter at that time with abnormal TFT's and she had her thyroid removed in 1999.  She has been on replacement thyroid since then.  Since the last visit,  there has been no overall change in her status. She remains on levothyroxine po daily.  She claims to have no symptoms suggestive of thyroid imbalance specifically denying fatigue, cold intolerance, heat intolerance, tremors, anxiety, unexplained weight changes, insomnia,but she has fluctuations of diarrhea and constipation and she does complain of dry skin.  She has history of hot flashes.   The patient is a 67 year old female who presents for a follow-up evaluation of hypertension.  She was diagnosed with HTN in 2020.  Since her last visit, there has been no problems.  The patient has been checking her  blood pressure at home.  She states her systolic BP is in the 120's.  The patient's current medications include: losartan 25 mg daily. The patient has been tolerating her medications well. The patient denies any visual changes, dizziness, headaches, chest pain, lightheadness, palpitations, generalized weakness or edema. She reports there have been no other symptoms noted.    The patient is a 67 year old female , post-menopausal, who presents for followup of her osteoporosis. She tells me that her last bone density in [place] showed she had osteopenia. We did a bone density on 09/04/2022 which showed a lumbar spine t-score of -2.6 where she was diagnosed with osteoporosis. I asked her to start on  aldendronate 70mg  weekly which she states her reflux has worsened.  We therefore placed her on iv reclast. She has no specific complaints related to bone loss. She reports a family history of osteoporosis. The following risk factors are noted: history of tobacco abuse (quit in 2009), She denies the following: diabetes mellitus, high caffeine intake, alcohol consumption of more that 7 ounces per week, daily prednisone use, hyperthyroidism, surgical resection of her bowel, and surgical resection of her stomach. She states she exercises routinely with walking and biking. I also asked her to start on calcium and Vit D but the patient states she gets severe constipation from calcium and even VIt D supplementation.  I did discuss medications with her and I wanted her to stop the alendronate as it is causing worsening reflux.  We discussed prolia vs. Reclast and possible side effects.  We set her up for iv reclast which was done on 10/29/2022.    The patient also has a history of OSA which was diagnosed about 6 years ago.  She does wear a CPAP nightly and is compliant.  She also has a history of seasonal allergies that occur during the Spring and Fall.  She will get sinus congestion, runny nose, sneezing, post nasal drip.  She uses flonase at this time prn.  She is also receiving allergies shots and began her immunotherapy with Dr. Heath Gold about 12 months ago.  Otila L. Sutcliffe returns today for routine followup on her cholesterol. The patient does have an allergy to statins where she has been on lipitor, zocor and crestor and they all caused myalgias.  She is currently followed by the [facility] with her last visit in 06/2022. Overall, she states she is doing well and is without any complaints or problems at this time. She specifically denies chest pain, abdominal pain, nausea, diarrhea, and myalgias. She remains on dietary management as well as the following cholesterol lowering medications: Repatha which was  recently started by Cardiology as she is intolerant to statins.  The patient is currently fasting for her labs today.  The patient also returns for followup of her depression and anxiety.  She as diagnosed with depression around 2018 where she was diagnosed with PTSD with depression and anxiety.  She was previously on clonazepam and wellbutryin which she stopped on her own around 2020.  Today, She states her depression is moderate and her anxiety is mild.  She is currently on xanax 0.25mg  po daily prn which she uses 3-4 times per week.  She denies any problems with concentration, doing routine daily activives, feelings of guilt, feelings of worthlessness, hopelessness, loss of appetite, weight loss/gain, social withdrawal, loss of pleasure in activies and no suicidal ideation/ homicidal ideation. This patient feels that she is able to care for herself and her  dependents.  She currently lives with her husband.  She has seen a psychiatrist and a therapist in the past but not currently.  She does have a history of chronic venous insufficiency where she saw the vein clinic in 02/2022.  The patient will get intermittent swelling of her legs.  She tells me she had laser therapy years ago to her right leg.  They discussed management including exercise and wearing compression hose which she uses when she is outside.  The patient had a fall on 02/07/2023 where she fell down some steps.  She denies any syncope/dizziness and she did not hit her head or have a LOC.  She went to see ortho and was discovered to have an ankle fracture where she underwent surgery.  She is undergoing physical therapy at this time.  She also saw ortho last year in 02/2022 for low back pain with left sided radiculopathy.  They did xrays of her lumbar spine and this showed DDD throughout worse at L5-S1.  They did a MRI (I do not have the results).  She did not go for an ESI and she states it still present but mild.  She never received any therapies  for her back pain.      Are there smokers in your home (other than you)? No  Risk Factors Current exercise habits:  as above   Dietary issues discussed: none   Depression Screen (Note: if answer to either of the following is "Yes", a more complete depression screening is indicated)   Over the past two weeks, have you felt down, depressed or hopeless? No  Over the past two weeks, have you felt little interest or pleasure in doing things? No  Have you lost interest or pleasure in daily life? No  Do you often feel hopeless? No  Do you cry easily over simple problems? No  Activities of Daily Living In your present state of health, do you have any difficulty performing the following activities?:  Driving? No Managing money?  No Feeding yourself? No Getting from bed to chair? No Climbing a flight of stairs? No Preparing food and eating?: No Bathing or showering? No Getting dressed: No Getting to the toilet? No Using the toilet:No Moving around from place to place: No In the past year have you fallen or had a near fall?:Yes   Are you sexually active?  No  Do you have more than one partner?  No  Hearing Difficulties: No Do you often ask people to speak up or repeat themselves? No Do you experience ringing or noises in your ears? No Do you have difficulty understanding soft or whispered voices? No   Do you feel that you have a problem with memory? No  Do you often misplace items? No  Do you feel safe at home?  Yes  Cognitive Testing  Alert? Yes  Normal Appearance?Yes  Oriented to person? Yes  Place? Yes   Time? Yes  Recall of three objects?  Yes  Can perform simple calculations? Yes  Displays appropriate judgment?Yes  Can read the correct time from a watch face?Yes  Fall Risk Prevention  Any stairs in or around the home? No  If so, are there any without handrails? No  Home free of loose throw rugs in walkways, pet beds, electrical cords, etc? Yes  Adequate lighting  in your home to reduce risk of falls? Yes  Use of a cane, walker or w/c? Yes - she using a temporary knee scooter for her  broken right ankle   Time Up and Go  Was the test performed? Yes .  Length of time to ambulate 10 feet: 15 sec.   Gait slow and steady without use of assistive device    Advanced Directives have been discussed with the patient? Yes   List the Names of Other Physician/Practitioners you currently use: Patient Care Team: Crist Fat, MD as PCP - General (Internal Medicine)    Past Medical History:  Diagnosis Date   Anxiety    Breast cancer [facility])    Depression    GERD (gastroesophageal reflux disease)    Hyperlipidemia    Hypertension    Hypothyroidism    Memory loss    OSA (obstructive sleep apnea)     Past Surgical History:  Procedure Laterality Date   ABDOMINAL HYSTERECTOMY     CHOLECYSTECTOMY     ENDOVENOUS ABLATION SAPHENOUS VEIN W/ LASER Right 01/2020   [place]   ENDOVENOUS ABLATION SAPHENOUS VEIN W/ LASER Left 05/2020   [place]   MASTECTOMY Bilateral    THYROIDECTOMY        Current Medications  Current Outpatient Medications  Medication Sig Dispense Refill   ALPRAZolam (XANAX) 0.25 MG tablet Take 1 tablet (0.25 mg total) by mouth daily as needed. 30 tablet 2   Evolocumab (REPATHA SURECLICK [place]) Inject into the skin.     Evolocumab (REPATHA SURECLICK) 140 MG/ML SOAJ Inject 140 mg into the skin every 14 (fourteen) days. 2 mL 12   ezetimibe (ZETIA) 10 MG tablet ezetimibe 10 mg tablet (Patient not taking: Reported on 07/21/2022)     fluticasone (FLONASE) 50 MCG/ACT nasal spray Place 1 spray into both nostrils daily. 16 g 3   losartan (COZAAR) 25 MG tablet losartan 25 mg tablet     pantoprazole (PROTONIX) 40 MG tablet TAKE 1 TABLET(40 MG) BY MOUTH DAILY 90 tablet 0   SYNTHROID 75 MCG tablet TAKE 1 TABLET(75 MCG) BY MOUTH DAILY 90 tablet 1   Current Facility-Administered Medications  Medication Dose Route Frequency Provider Last Rate Last  Admin   sodium chloride flush (NS) 0.9 % injection 3 mL  3 mL Intravenous Q12H [place], Peter M, MD        Allergies Fluogen [influenza virus vaccine] and Penicillins   Social History Social History   Tobacco Use   Smoking status: Former    Current packs/day: 0.00    Types: Cigarettes    Quit date: 09/23/2007    Years since quitting: 15.6   Smokeless tobacco: Never  Substance Use Topics   Alcohol use: Not on file     Review of Systems Review of Systems  Constitutional:  Negative for chills, fever, malaise/fatigue and weight loss.  HENT:  Negative for hearing loss and tinnitus.   Eyes:  Negative for blurred vision and double vision.  Respiratory:  Negative for cough, hemoptysis, shortness of breath and wheezing.   Cardiovascular:  Negative for chest pain, palpitations and leg swelling.  Gastrointestinal:  Negative for abdominal pain, blood in stool, constipation, diarrhea, heartburn, melena, nausea and vomiting.  Genitourinary:  Negative for frequency and hematuria.  Musculoskeletal:  Positive for back pain. Negative for myalgias.  Skin:  Negative for itching and rash.  Neurological:  Negative for dizziness, weakness and headaches.  Endo/Heme/Allergies:  Negative for polydipsia.  Psychiatric/Behavioral:  Negative for memory loss and substance abuse.      Physical Exam:      Body mass index is 29.65 kg/m. BP 122/80   Pulse 92  Temp 98.4 F (36.9 C)   Resp 18   Ht 5\' 8"  (1.727 m)   Wt 195 lb (88.5 kg)   SpO2 96%   BMI 29.65 kg/m   Physical Exam   Assessment:      No diagnosis found.    Plan:     During the course of the visit the patient was educated and counseled about appropriate screening and preventive services including:   Pneumococcal vaccine  Influenza vaccine Screening mammography Bone densitometry screening Colorectal cancer screening Advanced directives: discussed and she has plan in place.  Diet review for nutrition referral? Yes ____   Not Indicated _X___   Patient Instructions (the written plan) was given to the patient.  No problem-specific Assessment & Plan notes found for this encounter.    Prevention   Medicare Attestation I have personally reviewed: The patient's medical and social history Their use of alcohol, tobacco or illicit drugs Their current medications and supplements The patient's functional ability including ADLs,fall risks, home safety risks, cognitive, and hearing and visual impairment Diet and physical activities Evidence for depression or mood disorders  The patient's weight, height, and BMI have been recorded in the chart.  I have made referrals, counseling, and provided education to the patient based on review of the above and I have provided the patient with a written personalized care plan for preventive services.     Crist Fat, MD   05/08/2023

## 2023-05-09 LAB — CBC WITH DIFFERENTIAL/PLATELET
Basophils Absolute: 0 10*3/uL (ref 0.0–0.2)
Basos: 1 %
EOS (ABSOLUTE): 0.1 10*3/uL (ref 0.0–0.4)
Eos: 3 %
Hematocrit: 39.9 % (ref 34.0–46.6)
Hemoglobin: 12.8 g/dL (ref 11.1–15.9)
Immature Grans (Abs): 0 10*3/uL (ref 0.0–0.1)
Immature Granulocytes: 0 %
Lymphocytes Absolute: 1.8 10*3/uL (ref 0.7–3.1)
Lymphs: 33 %
MCH: 29.2 pg (ref 26.6–33.0)
MCHC: 32.1 g/dL (ref 31.5–35.7)
MCV: 91 fL (ref 79–97)
Monocytes Absolute: 0.4 10*3/uL (ref 0.1–0.9)
Monocytes: 7 %
Neutrophils Absolute: 3.2 10*3/uL (ref 1.4–7.0)
Neutrophils: 56 %
Platelets: 346 10*3/uL (ref 150–450)
RBC: 4.38 x10E6/uL (ref 3.77–5.28)
RDW: 13 % (ref 11.7–15.4)
WBC: 5.5 10*3/uL (ref 3.4–10.8)

## 2023-05-09 LAB — LIPID PANEL
Chol/HDL Ratio: 3 ratio (ref 0.0–4.4)
Cholesterol, Total: 227 mg/dL — ABNORMAL HIGH (ref 100–199)
HDL: 76 mg/dL (ref >39)
LDL Chol Calc (NIH): 134 mg/dL — ABNORMAL HIGH (ref 0–99)
Triglycerides: 100 mg/dL (ref 0–149)
VLDL Cholesterol Cal: 17 mg/dL (ref 5–40)

## 2023-05-09 LAB — CMP14 + ANION GAP
ALT: 19 IU/L (ref 0–32)
AST: 21 IU/L (ref 0–40)
Albumin: 4.5 g/dL (ref 3.9–4.9)
Alkaline Phosphatase: 69 IU/L (ref 44–121)
Anion Gap: 16 mmol/L (ref 10.0–18.0)
BUN/Creatinine Ratio: 14 (ref 12–28)
BUN: 13 mg/dL (ref 8–27)
Bilirubin Total: 0.3 mg/dL (ref 0.0–1.2)
CO2: 21 mmol/L (ref 20–29)
Calcium: 9.5 mg/dL (ref 8.7–10.3)
Chloride: 102 mmol/L (ref 96–106)
Creatinine, Ser: 0.95 mg/dL (ref 0.57–1.00)
Globulin, Total: 2.8 g/dL (ref 1.5–4.5)
Glucose: 93 mg/dL (ref 70–99)
Potassium: 5.3 mmol/L — ABNORMAL HIGH (ref 3.5–5.2)
Sodium: 139 mmol/L (ref 134–144)
Total Protein: 7.3 g/dL (ref 6.0–8.5)
eGFR: 65 mL/min/{1.73_m2} (ref >59)

## 2023-05-09 LAB — VITAMIN D 25 HYDROXY (VIT D DEFICIENCY, FRACTURES): Vit D, 25-Hydroxy: 21.4 ng/mL — ABNORMAL LOW (ref 30.0–100.0)

## 2023-05-09 LAB — HEMOGLOBIN A1C
Est. average glucose Bld gHb Est-mCnc: 123 mg/dL
Hgb A1c MFr Bld: 5.9 % — ABNORMAL HIGH (ref 4.8–5.6)

## 2023-05-09 LAB — TSH: TSH: 2.7 u[IU]/mL (ref 0.450–4.500)

## 2023-05-09 LAB — T4, FREE: Free T4: 1.31 ng/dL (ref 0.82–1.77)

## 2023-05-11 DIAGNOSIS — M25471 Effusion, right ankle: Secondary | ICD-10-CM | POA: Diagnosis not present

## 2023-05-11 DIAGNOSIS — M25571 Pain in right ankle and joints of right foot: Secondary | ICD-10-CM | POA: Diagnosis not present

## 2023-05-11 DIAGNOSIS — M25671 Stiffness of right ankle, not elsewhere classified: Secondary | ICD-10-CM | POA: Diagnosis not present

## 2023-05-13 DIAGNOSIS — M25571 Pain in right ankle and joints of right foot: Secondary | ICD-10-CM | POA: Diagnosis not present

## 2023-05-13 DIAGNOSIS — M25471 Effusion, right ankle: Secondary | ICD-10-CM | POA: Diagnosis not present

## 2023-05-13 DIAGNOSIS — M25671 Stiffness of right ankle, not elsewhere classified: Secondary | ICD-10-CM | POA: Diagnosis not present

## 2023-05-14 ENCOUNTER — Other Ambulatory Visit: Payer: Self-pay

## 2023-05-14 DIAGNOSIS — M810 Age-related osteoporosis without current pathological fracture: Secondary | ICD-10-CM

## 2023-05-14 NOTE — Progress Notes (Signed)
New rx for vit D has been sent to the pharmacy.

## 2023-05-15 ENCOUNTER — Other Ambulatory Visit: Payer: Self-pay

## 2023-05-15 MED ORDER — VITAMIN D 25 MCG (1000 UNIT) PO TABS: 1000.0000 [IU] | ORAL_TABLET | Freq: Every day | ORAL | 0 refills | Status: DC

## 2023-05-18 DIAGNOSIS — M25471 Effusion, right ankle: Secondary | ICD-10-CM | POA: Diagnosis not present

## 2023-05-18 DIAGNOSIS — M25671 Stiffness of right ankle, not elsewhere classified: Secondary | ICD-10-CM | POA: Diagnosis not present

## 2023-05-18 DIAGNOSIS — M25571 Pain in right ankle and joints of right foot: Secondary | ICD-10-CM | POA: Diagnosis not present

## 2023-05-19 ENCOUNTER — Other Ambulatory Visit: Payer: Self-pay

## 2023-05-19 MED ORDER — VITAMIN D 25 MCG (1000 UNIT) PO TABS: 1000.0000 [IU] | ORAL_TABLET | Freq: Every day | ORAL | 1 refills | Status: DC

## 2023-05-19 NOTE — Progress Notes (Signed)
She is developing prediabetes. Watch the sugars in her diet, eat healthy and exercise. Her cholesterol is a bit elevated but she goes to the lipid clinic. Her Vitamin D is low. Add Vitamin D 1000 Units daily to her pill regimen.   Called patient and made aware of results, and new rx for vit D

## 2023-05-19 NOTE — Progress Notes (Signed)
New RX

## 2023-05-20 DIAGNOSIS — M25471 Effusion, right ankle: Secondary | ICD-10-CM | POA: Diagnosis not present

## 2023-05-20 DIAGNOSIS — M25571 Pain in right ankle and joints of right foot: Secondary | ICD-10-CM | POA: Diagnosis not present

## 2023-05-20 DIAGNOSIS — M25671 Stiffness of right ankle, not elsewhere classified: Secondary | ICD-10-CM | POA: Diagnosis not present

## 2023-05-26 DIAGNOSIS — M25571 Pain in right ankle and joints of right foot: Secondary | ICD-10-CM | POA: Diagnosis not present

## 2023-05-26 DIAGNOSIS — J301 Allergic rhinitis due to pollen: Secondary | ICD-10-CM | POA: Diagnosis not present

## 2023-05-26 DIAGNOSIS — M25471 Effusion, right ankle: Secondary | ICD-10-CM | POA: Diagnosis not present

## 2023-05-26 DIAGNOSIS — M25671 Stiffness of right ankle, not elsewhere classified: Secondary | ICD-10-CM | POA: Diagnosis not present

## 2023-05-27 ENCOUNTER — Other Ambulatory Visit: Payer: Self-pay | Admitting: Internal Medicine

## 2023-05-28 DIAGNOSIS — M25671 Stiffness of right ankle, not elsewhere classified: Secondary | ICD-10-CM | POA: Diagnosis not present

## 2023-05-28 DIAGNOSIS — M25571 Pain in right ankle and joints of right foot: Secondary | ICD-10-CM | POA: Diagnosis not present

## 2023-05-28 DIAGNOSIS — M25471 Effusion, right ankle: Secondary | ICD-10-CM | POA: Diagnosis not present

## 2023-06-01 DIAGNOSIS — M25471 Effusion, right ankle: Secondary | ICD-10-CM | POA: Diagnosis not present

## 2023-06-01 DIAGNOSIS — M25671 Stiffness of right ankle, not elsewhere classified: Secondary | ICD-10-CM | POA: Diagnosis not present

## 2023-06-01 DIAGNOSIS — M25571 Pain in right ankle and joints of right foot: Secondary | ICD-10-CM | POA: Diagnosis not present

## 2023-06-03 DIAGNOSIS — M25471 Effusion, right ankle: Secondary | ICD-10-CM | POA: Diagnosis not present

## 2023-06-03 DIAGNOSIS — M25571 Pain in right ankle and joints of right foot: Secondary | ICD-10-CM | POA: Diagnosis not present

## 2023-06-03 DIAGNOSIS — M25671 Stiffness of right ankle, not elsewhere classified: Secondary | ICD-10-CM | POA: Diagnosis not present

## 2023-06-03 NOTE — Progress Notes (Signed)
[facility]  [address] [place],  [place]  [address] [phone]  Clinic Day:  06/04/2023  Referring physician: Crist Fat, MD   HISTORY OF PRESENT ILLNESS:  Haley Beasley is a 67 y.o. female who I was asked to consult upon in order to maintain her breast cancer surveillance.  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  This led to her undergoing a left breast lumpectomy, followed by adjuvant breast radiation.  In 2009, despite having a recently normal mammogram, the patient felt a large, painful left breast mass.  This led to an ultrasound being done, which showed a large mass.  This finding led to her desiring to undergo a bilateral mastectomy, which was then followed by reconstructive breast surgery.  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  All of this was done in the [facility] area.  Fortunately, studies and exams since then have shown no evidence of disease recurrence.  The patient claims she has not seen an oncologist in over 3 years.  She denies having any new symptoms or findings which concern her for late disease recurrence.  She underwent genetic testing approximately in May 2023 due to her strong family history of cancer.  Fortunately, her genetic test results came back showing no pathogenic mutations.    PAST MEDICAL HISTORY:   Past Medical History:  Diagnosis Date   Anxiety    Breast cancer (HCC)    Depression    GERD (gastroesophageal reflux disease)    Hyperlipidemia    Hypertension    Hypothyroidism    Memory loss    OSA (obstructive sleep apnea)     PAST SURGICAL HISTORY:   Past Surgical History:  Procedure Laterality Date   ABDOMINAL  HYSTERECTOMY     ANKLE FRACTURE SURGERY Right    BREAST BIOPSY Left    BREAST RECONSTRUCTION     CATARACT EXTRACTION Bilateral    CHOLECYSTECTOMY     COLONOSCOPY W/ POLYPECTOMY     ENDOVENOUS ABLATION SAPHENOUS VEIN W/ LASER Right 01/2020   [place]   ENDOVENOUS ABLATION SAPHENOUS VEIN W/ LASER Left 05/2020   [place]   LYMPH NODE BIOPSY Left    AXILLARY   MASTECTOMY Bilateral    THYROIDECTOMY     TUBAL LIGATION Bilateral    WISDOM TOOTH EXTRACTION      CURRENT MEDICATIONS:   Current Outpatient Medications  Medication Sig Dispense Refill   ALPRAZolam (XANAX) 0.25 MG tablet Take 1 tablet (0.25 mg total) by mouth daily as needed. 30 tablet 2   cholecalciferol (VITAMIN D3) 25 MCG (1000 UNIT) tablet Take 1 tablet (1,000 Units total) by mouth daily. 90 tablet 0   cholecalciferol (VITAMIN D3) 25 MCG (1000 UNIT) tablet Take 1 tablet (1,000 Units total) by mouth daily. 30 tablet 1   Evolocumab (REPATHA SURECLICK [place]) Inject into the skin.     Evolocumab (REPATHA SURECLICK) 140 MG/ML SOAJ Inject 140 mg into the skin every 14 (fourteen) days. 2 mL 12   ezetimibe (ZETIA) 10 MG tablet ezetimibe 10 mg tablet (Patient not taking: Reported on 07/21/2022)     fluticasone (FLONASE) 50 MCG/ACT nasal spray Place 1 spray into both nostrils daily. 16 g 3   losartan (COZAAR) 25 MG tablet losartan 25 mg  tablet     pantoprazole (PROTONIX) 40 MG tablet TAKE 1 TABLET(40 MG) BY MOUTH DAILY 90 tablet 0   sertraline (ZOLOFT) 25 MG tablet Take 1 tablet (25 mg total) by mouth daily. 90 tablet 3   SYNTHROID 75 MCG tablet TAKE 1 TABLET(75 MCG) BY MOUTH DAILY 90 tablet 1   Current Facility-Administered Medications  Medication Dose Route Frequency Provider Last Rate Last Admin   sodium chloride flush (NS) 0.9 % injection 3 mL  3 mL Intravenous Q12H [place], Peter M, MD        ALLERGIES:   Allergies  Allergen Reactions   Fluogen [Influenza Virus Vaccine]    Penicillins     FAMILY HISTORY:   Family History   Problem Relation Age of Onset   Heart failure Mother    Colon cancer Mother 72   Heart attack Father 4   Heart disease Father    Heart disease Sister    Dementia Sister    Parkinson's disease Sister    Dementia Sister    Alzheimer's disease Sister    Heart disease Brother    Cancer Brother        UNK PRIMARY   Renal cancer Brother    Pancreatic cancer Brother    Asthma Brother    Lung cancer Brother    Brain cancer Maternal Uncle        d 81s   Renal cancer Paternal Uncle        d 27s   Heart disease Son 22       CHF   Heart attack Son    Breast cancer Cousin        dx 28s   Breast cancer Cousin        1 dx 30s-40s, 2 dx 7s   Breast cancer Cousin    Leukemia Cousin    Cancer Half-Brother        UNK PRIMARY   SOCIAL HISTORY:  The patient was born and raised in [place], [place].  She currently lives in town with her husband of 49 years.  They have 2 children, 4 grandchildren, and 3 great-grandchildren.  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  She smoked a pack of cigarettes daily for 25 years before quitting 15 years ago.  She drinks alcohol on rare occasions.  REVIEW OF SYSTEMS:  Review of Systems  Constitutional:  Negative for fatigue and fever.  HENT:   Negative for hearing loss and sore throat.   Eyes:  Negative for eye problems.  Respiratory:  Negative for chest tightness, cough and hemoptysis.   Cardiovascular:  Negative for chest pain and palpitations.  Gastrointestinal:  Negative for abdominal distention, abdominal pain, blood in stool, constipation, diarrhea, nausea and vomiting.  Endocrine: Negative for hot flashes.  Genitourinary:  Negative for difficulty urinating, dysuria, frequency, hematuria and nocturia.   Musculoskeletal:  Negative for arthralgias, back pain, gait problem and myalgias.  Skin:  Positive for rash. Negative for itching.  Neurological: Negative.  Negative for dizziness, extremity weakness, gait problem,  headaches, light-headedness and numbness.  Hematological: Negative.   Psychiatric/Behavioral:  Positive for depression. Negative for suicidal ideas. The patient is nervous/anxious.     PHYSICAL EXAM:  Blood pressure (!) 179/79, pulse 63, temperature 98.1 F (36.7 C), resp. rate 14, height 5\' 8"  (1.727 m), weight 193 lb 11.2 oz (87.9 kg), SpO2 97%. Wt Readings from Last 3 Encounters:  06/04/23 193 lb 11.2 oz (87.9 kg)  05/08/23 195  lb (88.5 kg)  01/07/23 193 lb (87.5 kg)   Body mass index is 29.45 kg/m. Performance status (ECOG): 0 - Asymptomatic Physical Exam Constitutional:      Appearance: Normal appearance.  HENT:     Mouth/Throat:     Pharynx: Oropharynx is clear. No oropharyngeal exudate.  Cardiovascular:     Rate and Rhythm: Normal rate and regular rhythm.     Heart sounds: No murmur heard.    No friction rub. No gallop.  Pulmonary:     Breath sounds: Normal breath sounds.  Chest:  Breasts:    Right: Absent. No swelling, bleeding, inverted nipple, mass, nipple discharge or skin change.     Left: Absent. No swelling, bleeding, inverted nipple, mass, nipple discharge or skin change.     Comments: There are no abnormalities seen nor palpated around her bilateral reconstructed breasts Abdominal:     General: Bowel sounds are normal. There is no distension.     Palpations: Abdomen is soft. There is no mass.     Tenderness: There is no abdominal tenderness.  Musculoskeletal:        General: No tenderness.     Cervical back: Normal range of motion and neck supple.     Right lower leg: No edema.     Left lower leg: No edema.  Lymphadenopathy:     Cervical: No cervical adenopathy.     Right cervical: No superficial, deep or posterior cervical adenopathy.    Left cervical: No superficial, deep or posterior cervical adenopathy.     Upper Body:     Right upper body: No supraclavicular or axillary adenopathy.     Left upper body: No supraclavicular or axillary adenopathy.      Lower Body: No right inguinal adenopathy. No left inguinal adenopathy.  Skin:    Coloration: Skin is not jaundiced.     Findings: No lesion or rash.  Neurological:     General: No focal deficit present.     Mental Status: She is alert and oriented to person, place, and time. Mental status is at baseline.  Psychiatric:        Mood and Affect: Mood normal.        Behavior: Behavior normal.        Thought Content: Thought content normal.        Judgment: Judgment normal.    LABS:      Latest Ref Rng & Units 05/08/2023   10:05 AM  CBC  WBC 3.4 - 10.8 x10E3/uL 5.5   Hemoglobin 11.1 - 15.9 g/dL 40.9   Hematocrit 81.1 - 46.6 % 39.9   Platelets 150 - 450 x10E3/uL 346       Latest Ref Rng & Units 05/08/2023   10:05 AM 11/05/2022    8:39 AM 10/06/2022    2:44 PM  CMP  Glucose 70 - 99 mg/dL 93   914   BUN 8 - 27 mg/dL 13   14   Creatinine 7.82 - 1.00 mg/dL 9.56   2.13   Sodium 086 - 144 mmol/L 139   139   Potassium 3.5 - 5.2 mmol/L 5.3   4.4   Chloride 96 - 106 mmol/L 102   101   CO2 20 - 29 mmol/L 21   19   Calcium 8.7 - 10.3 mg/dL 9.5   9.7   Total Protein 6.0 - 8.5 g/dL 7.3  7.4  7.3   Total Bilirubin 0.0 - 1.2 mg/dL 0.3  0.3  0.2  Alkaline Phos 44 - 121 IU/L 69  81  75   AST 0 - 40 IU/L 21  21  21    ALT 0 - 32 IU/L 19  28  22      ASSESSMENT & PLAN:  VONCILE DENBOW is a 67 y.o. female who I was asked to consult upon with respect to continuing her breast cancer surveillance/management.  As mentioned previously, she was initially diagnosed with early stage hormone receptor negative breast cancer in her left breast in 2006. 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  Based upon her physical exam today, the patient remains disease-free.  Clinically, the patient appears to be doing very well.  In  clinic today, I emphasized to the patient that the likelihood of developing late disease recurrence is very low.  Usually, aggressive HER2 positive breast cancer does not wait 15+ years to resurface.  The patient felt reassured by this information.  As she has no pressing breast cancer issues at this time, I will see her back in 1 year for repeat clinical assessment.  The patient understands all the plans discussed today and is in agreement with them.  I do appreciate Crist Fat, MD for his new consult.   Fabrice Dyal Kirby Funk, MD

## 2023-06-04 ENCOUNTER — Encounter: Payer: Self-pay | Admitting: Oncology

## 2023-06-04 ENCOUNTER — Inpatient Hospital Stay: Payer: Medicare HMO

## 2023-06-04 ENCOUNTER — Inpatient Hospital Stay: Payer: Medicare HMO | Attending: Oncology | Admitting: Oncology

## 2023-06-04 VITALS — BP 179/79 | HR 63 | Temp 98.1°F | Resp 14 | Ht 68.0 in | Wt 193.7 lb

## 2023-06-04 DIAGNOSIS — Z171 Estrogen receptor negative status [ER-]: Secondary | ICD-10-CM | POA: Diagnosis not present

## 2023-06-04 DIAGNOSIS — C50112 Malignant neoplasm of central portion of left female breast: Secondary | ICD-10-CM | POA: Diagnosis not present

## 2023-06-06 DIAGNOSIS — C50919 Malignant neoplasm of unspecified site of unspecified female breast: Secondary | ICD-10-CM | POA: Insufficient documentation

## 2023-06-08 DIAGNOSIS — M25571 Pain in right ankle and joints of right foot: Secondary | ICD-10-CM | POA: Diagnosis not present

## 2023-06-08 DIAGNOSIS — M25471 Effusion, right ankle: Secondary | ICD-10-CM | POA: Diagnosis not present

## 2023-06-08 DIAGNOSIS — M25671 Stiffness of right ankle, not elsewhere classified: Secondary | ICD-10-CM | POA: Diagnosis not present

## 2023-06-10 DIAGNOSIS — M25671 Stiffness of right ankle, not elsewhere classified: Secondary | ICD-10-CM | POA: Diagnosis not present

## 2023-06-10 DIAGNOSIS — M25571 Pain in right ankle and joints of right foot: Secondary | ICD-10-CM | POA: Diagnosis not present

## 2023-06-10 DIAGNOSIS — M25471 Effusion, right ankle: Secondary | ICD-10-CM | POA: Diagnosis not present

## 2023-06-15 ENCOUNTER — Encounter: Payer: Self-pay | Admitting: Student

## 2023-06-15 ENCOUNTER — Ambulatory Visit: Payer: Medicare HMO | Admitting: Student

## 2023-06-15 VITALS — BP 124/80 | HR 79 | Temp 97.4°F | Resp 16 | Ht 68.0 in | Wt 192.4 lb

## 2023-06-15 DIAGNOSIS — R21 Rash and other nonspecific skin eruption: Secondary | ICD-10-CM | POA: Diagnosis not present

## 2023-06-15 DIAGNOSIS — M25571 Pain in right ankle and joints of right foot: Secondary | ICD-10-CM | POA: Diagnosis not present

## 2023-06-15 DIAGNOSIS — M25671 Stiffness of right ankle, not elsewhere classified: Secondary | ICD-10-CM | POA: Diagnosis not present

## 2023-06-15 DIAGNOSIS — M25471 Effusion, right ankle: Secondary | ICD-10-CM | POA: Diagnosis not present

## 2023-06-15 MED ORDER — METHYLPREDNISOLONE NA SUC (PF) 40 MG IJ SOLR
80.0000 mg | Freq: Once | INTRAMUSCULAR | Status: AC
  Administered 2023-06-15: 80 mg via INTRAMUSCULAR

## 2023-06-15 MED ORDER — TRIAMCINOLONE ACETONIDE 0.5 % EX OINT: 1.0000 | TOPICAL_OINTMENT | Freq: Two times a day (BID) | CUTANEOUS | 0 refills | Status: DC

## 2023-06-15 NOTE — Progress Notes (Signed)
Acute Office Visit  Subjective:     Patient ID: Haley Beasley, female    DOB: Nov 12, 1955, 67 y.o.   MRN: [id number]  Chief Complaint  Patient presents with   Rash    Rash on Rt ankle x 1 week, has gotten worse last couple days, she had surgery on foot in May for a broken foot. Had a plate and screws put in. The rash is not itchy, but it has a burning feeling.     HPI  Patient is in today for 1 week of a rash on the right lower leg. She is 4 months post-op ankle surgery with hardware. The rash is red with multiple pin point bumps. She denies any exposures to bug bites or poison oak. She was wearing a sleeve to protect the extremity from the ankle immobilizer. At home she's used hydrocortisone cream and gold bond for burning and irritation but this has not provided any relief.   She also feels like her lymph nodes are swollen and reports fullness with swallowing. She sees an allergist for injections. She denies rhinorrhea, coryza, fevers, and no ear pain.  Review of Systems  Constitutional: Negative.   Respiratory: Negative.    Cardiovascular: Negative.   Gastrointestinal: Negative.   Skin: Negative.   Neurological: Negative.   Endo/Heme/Allergies: Negative.         Objective:    BP 124/80   Pulse 79   Temp (!) 97.4 F (36.3 C)   Resp 16   Ht 5\' 8"  (1.727 m)   Wt 192 lb 6 oz (87.3 kg)   SpO2 97%   BMI 29.25 kg/m    Physical Exam Vitals reviewed.  Constitutional:      Appearance: Normal appearance.  HENT:     Head: Normocephalic.     Right Ear: Tympanic membrane, ear canal and external ear normal.     Left Ear: Tympanic membrane, ear canal and external ear normal.     Nose: Nose normal. No congestion.     Mouth/Throat:     Mouth: Mucous membranes are moist.     Pharynx: Oropharynx is clear. No oropharyngeal exudate or posterior oropharyngeal erythema.  Eyes:     Extraocular Movements: Extraocular movements intact.     Conjunctiva/sclera: Conjunctivae normal.   Cardiovascular:     Rate and Rhythm: Normal rate and regular rhythm.     Pulses: Normal pulses.     Heart sounds: Normal heart sounds.  Pulmonary:     Effort: Pulmonary effort is normal.     Breath sounds: Normal breath sounds.  Abdominal:     General: Bowel sounds are normal.     Palpations: Abdomen is soft.  Musculoskeletal:     Cervical back: Neck supple. No rigidity or tenderness.  Lymphadenopathy:     Cervical: Cervical adenopathy present.  Skin:    General: Skin is warm.     Capillary Refill: Capillary refill takes less than 2 seconds.     Findings: Rash present. Rash is vesicular.  Neurological:     Mental Status: She is alert and oriented to person, place, and time.  Psychiatric:        Mood and Affect: Mood normal.        Behavior: Behavior normal.     No results found for any visits on 06/15/23.      Assessment & Plan:   Problem List Items Addressed This Visit     Rash - Primary    She will receive  a 80 mg of IM SOLU-MEDROL in office for the inflammation. Triamcinolone 0.5% apply to the area twice day until rash resolves. For her throat she should use supportive care.       Relevant Medications   triamcinolone ointment (KENALOG) 0.5 %    Meds ordered this encounter  Medications   triamcinolone ointment (KENALOG) 0.5 %    Sig: Apply 1 Application topically 2 (two) times daily.    Dispense:  30 g    Refill:  0   methylPREDNISolone sodium succinate (SOLU-MEDROL) 40 MG injection 80 mg    No follow-ups on file.  Edwena Blow, NP

## 2023-06-15 NOTE — Assessment & Plan Note (Signed)
She will receive a 80 mg of IM SOLU-MEDROL in office for the inflammation. Triamcinolone 0.5% apply to the area twice day until rash resolves. For her throat she should use supportive care.

## 2023-06-17 DIAGNOSIS — M25671 Stiffness of right ankle, not elsewhere classified: Secondary | ICD-10-CM | POA: Diagnosis not present

## 2023-06-17 DIAGNOSIS — M25571 Pain in right ankle and joints of right foot: Secondary | ICD-10-CM | POA: Diagnosis not present

## 2023-06-17 DIAGNOSIS — M25471 Effusion, right ankle: Secondary | ICD-10-CM | POA: Diagnosis not present

## 2023-06-22 ENCOUNTER — Other Ambulatory Visit: Payer: Self-pay | Admitting: Internal Medicine

## 2023-06-24 DIAGNOSIS — M25671 Stiffness of right ankle, not elsewhere classified: Secondary | ICD-10-CM | POA: Diagnosis not present

## 2023-06-24 DIAGNOSIS — M25571 Pain in right ankle and joints of right foot: Secondary | ICD-10-CM | POA: Diagnosis not present

## 2023-06-24 DIAGNOSIS — M25471 Effusion, right ankle: Secondary | ICD-10-CM | POA: Diagnosis not present

## 2023-06-25 DIAGNOSIS — J301 Allergic rhinitis due to pollen: Secondary | ICD-10-CM | POA: Diagnosis not present

## 2023-06-29 DIAGNOSIS — M25571 Pain in right ankle and joints of right foot: Secondary | ICD-10-CM | POA: Diagnosis not present

## 2023-06-29 DIAGNOSIS — M25671 Stiffness of right ankle, not elsewhere classified: Secondary | ICD-10-CM | POA: Diagnosis not present

## 2023-06-29 DIAGNOSIS — M25471 Effusion, right ankle: Secondary | ICD-10-CM | POA: Diagnosis not present

## 2023-06-29 DIAGNOSIS — J301 Allergic rhinitis due to pollen: Secondary | ICD-10-CM | POA: Diagnosis not present

## 2023-06-30 ENCOUNTER — Encounter: Payer: Self-pay | Admitting: Internal Medicine

## 2023-07-01 DIAGNOSIS — M25671 Stiffness of right ankle, not elsewhere classified: Secondary | ICD-10-CM | POA: Diagnosis not present

## 2023-07-01 DIAGNOSIS — M25471 Effusion, right ankle: Secondary | ICD-10-CM | POA: Diagnosis not present

## 2023-07-01 DIAGNOSIS — M25571 Pain in right ankle and joints of right foot: Secondary | ICD-10-CM | POA: Diagnosis not present

## 2023-07-02 DIAGNOSIS — S82841D Displaced bimalleolar fracture of right lower leg, subsequent encounter for closed fracture with routine healing: Secondary | ICD-10-CM | POA: Diagnosis not present

## 2023-07-03 ENCOUNTER — Other Ambulatory Visit: Payer: Self-pay | Admitting: Cardiology

## 2023-07-03 DIAGNOSIS — I2510 Atherosclerotic heart disease of native coronary artery without angina pectoris: Secondary | ICD-10-CM

## 2023-07-03 DIAGNOSIS — E785 Hyperlipidemia, unspecified: Secondary | ICD-10-CM

## 2023-07-21 ENCOUNTER — Other Ambulatory Visit: Payer: Self-pay | Admitting: Internal Medicine

## 2023-07-27 ENCOUNTER — Other Ambulatory Visit: Payer: Self-pay | Admitting: Internal Medicine

## 2023-07-27 MED ORDER — ALPRAZOLAM 0.25 MG PO TABS
0.2500 mg | ORAL_TABLET | Freq: Every day | ORAL | 2 refills | Status: DC | PRN
Start: 2023-07-27 — End: 2023-11-18

## 2023-08-04 DIAGNOSIS — J301 Allergic rhinitis due to pollen: Secondary | ICD-10-CM | POA: Diagnosis not present

## 2023-08-07 ENCOUNTER — Ambulatory Visit: Payer: Medicare HMO | Admitting: Internal Medicine

## 2023-08-15 ENCOUNTER — Other Ambulatory Visit: Payer: Self-pay | Admitting: Internal Medicine

## 2023-08-17 ENCOUNTER — Ambulatory Visit: Payer: Medicare HMO | Admitting: Internal Medicine

## 2023-08-17 ENCOUNTER — Encounter: Payer: Self-pay | Admitting: Internal Medicine

## 2023-08-17 ENCOUNTER — Other Ambulatory Visit: Payer: Self-pay

## 2023-08-17 VITALS — BP 122/78 | HR 91 | Temp 98.1°F | Resp 17 | Ht 68.0 in | Wt 194.0 lb

## 2023-08-17 DIAGNOSIS — Z789 Other specified health status: Secondary | ICD-10-CM

## 2023-08-17 DIAGNOSIS — R7303 Prediabetes: Secondary | ICD-10-CM | POA: Diagnosis not present

## 2023-08-17 DIAGNOSIS — T466X5A Adverse effect of antihyperlipidemic and antiarteriosclerotic drugs, initial encounter: Secondary | ICD-10-CM | POA: Diagnosis not present

## 2023-08-17 DIAGNOSIS — I10 Essential (primary) hypertension: Secondary | ICD-10-CM

## 2023-08-17 DIAGNOSIS — E782 Mixed hyperlipidemia: Secondary | ICD-10-CM

## 2023-08-17 DIAGNOSIS — M609 Myositis, unspecified: Secondary | ICD-10-CM | POA: Diagnosis not present

## 2023-08-17 MED ORDER — PANTOPRAZOLE SODIUM 40 MG PO TBEC: 40.0000 mg | DELAYED_RELEASE_TABLET | Freq: Every day | ORAL | 0 refills | Status: DC

## 2023-08-17 MED ORDER — LOSARTAN POTASSIUM 25 MG PO TABS: 25.0000 mg | ORAL_TABLET | Freq: Every day | ORAL | 1 refills | Status: DC

## 2023-08-17 NOTE — Assessment & Plan Note (Signed)
Her BP is well controlled on losartan.  We will continue this medication.

## 2023-08-17 NOTE — Assessment & Plan Note (Signed)
We will check her FLP on her next visit.  She remains on repatha at this time.

## 2023-08-17 NOTE — Assessment & Plan Note (Signed)
Plan as below.

## 2023-08-17 NOTE — Assessment & Plan Note (Signed)
I want her to watch her diet and keep active with exercise.

## 2023-08-17 NOTE — Progress Notes (Signed)
Rx Refill

## 2023-08-17 NOTE — Progress Notes (Signed)
Office Visit  Subjective   Patient ID: Haley Beasley   DOB: 1956-07-09   Age: 67 y.o.   MRN: [id number]   Chief Complaint Chief Complaint  Patient presents with   Follow-up     History of Present Illness The patient is a 67 year old female who presents for a follow-up evaluation of hypertension.  She was diagnosed with HTN in 2020.  The patient has been checking her blood pressure at home.  She states her systolic BP is in the 110-120's.  The patient's current medications include: losartan 25 mg daily. The patient has been tolerating her medications well. The patient denies any visual changes, dizziness, headaches, chest pain, lightheadness, palpitations, generalized weakness or edema. She reports there have been no other symptoms noted.     Haley Beasley returns today for routine followup on her cholesterol. The patient does have an allergy to statins where she has been on lipitor, zocor and crestor and they all caused myalgias.  She is currently followed by the [facility] with her last visit in 06/2022. Overall, she states she is doing well and is without any complaints or problems at this time. She specifically denies chest pain, abdominal pain, nausea, diarrhea, and myalgias. She remains on dietary management as well as the following cholesterol lowering medications: Repatha 140mg  twice a month.  She is intolerant to statins simvastatin, crestor, atorvastatin, pravastatin which all caused myalgias.  The patient is currently fasting for her labs today.  The patient also returns for followup of her prediabetes.  She was noted to be prediabetic on yearly labs in 04/2023 where her HgBA1c was incidentally found to be 5.9%.  She is currently not on any medications and I asked her to control this with diet and exercise.  Today, she denies any abdominal pain, nausea, vomiting or other problems.  She does not routinely check her FSBS.     Past Medical History Past Medical History:   Diagnosis Date   Anxiety    Breast cancer (HCC)    Depression    GERD (gastroesophageal reflux disease)    Hyperlipidemia    Hypertension    Hypothyroidism    Memory loss    OSA (obstructive sleep apnea)      Allergies Allergies  Allergen Reactions   Fluogen [Influenza Virus Vaccine]    Penicillins      Medications  Current Outpatient Medications:    ALPRAZolam (XANAX) 0.25 MG tablet, Take 1 tablet (0.25 mg total) by mouth daily as needed., Disp: 30 tablet, Rfl: 2   Evolocumab (REPATHA SURECLICK) 140 MG/ML SOAJ, ADMINISTER 1 ML UNDER THE SKIN EVERY 14 DAYS, Disp: 2 mL, Rfl: 0   fluticasone (FLONASE) 50 MCG/ACT nasal spray, Place 1 spray into both nostrils daily., Disp: 16 g, Rfl: 3   losartan (COZAAR) 25 MG tablet, Take 1 tablet (25 mg total) by mouth daily., Disp: 90 tablet, Rfl: 1   pantoprazole (PROTONIX) 40 MG tablet, Take 1 tablet (40 mg total) by mouth daily., Disp: 90 tablet, Rfl: 0   sertraline (ZOLOFT) 25 MG tablet, Take 1 tablet (25 mg total) by mouth daily., Disp: 90 tablet, Rfl: 3   SYNTHROID 75 MCG tablet, TAKE 1 TABLET(75 MCG) BY MOUTH DAILY, Disp: 90 tablet, Rfl: 1   triamcinolone ointment (KENALOG) 0.5 %, Apply 1 Application topically 2 (two) times daily., Disp: 30 g, Rfl: 0   Review of Systems Review of Systems  Constitutional:  Negative for chills, fever and malaise/fatigue.  Eyes:  Negative for blurred vision and double vision.  Respiratory:  Negative for cough and shortness of breath.   Cardiovascular:  Negative for chest pain, palpitations and leg swelling.  Gastrointestinal:  Negative for abdominal pain, constipation, diarrhea, nausea and vomiting.  Genitourinary:  Negative for frequency.  Musculoskeletal:  Negative for myalgias.  Skin:  Negative for itching and rash.  Neurological:  Negative for dizziness, weakness and headaches.  Endo/Heme/Allergies:  Negative for polydipsia.       Objective:    Vitals BP 122/78   Pulse 91   Temp 98.1 F  (36.7 C)   Resp 17   Ht 5\' 8"  (1.727 m)   Wt 194 lb (88 kg)   SpO2 97%   BMI 29.50 kg/m    Physical Examination Physical Exam Constitutional:      Appearance: Normal appearance. She is not ill-appearing.  Cardiovascular:     Rate and Rhythm: Normal rate and regular rhythm.     Pulses: Normal pulses.     Heart sounds: No murmur heard.    No friction rub. No gallop.  Pulmonary:     Effort: Pulmonary effort is normal. No respiratory distress.     Breath sounds: No wheezing, rhonchi or rales.  Abdominal:     General: Bowel sounds are normal. There is no distension.     Palpations: Abdomen is soft.     Tenderness: There is no abdominal tenderness.  Musculoskeletal:     Right lower leg: No edema.     Left lower leg: No edema.  Skin:    General: Skin is warm and dry.     Findings: No rash.  Neurological:     General: No focal deficit present.     Mental Status: She is alert and oriented to person, place, and time.  Psychiatric:        Mood and Affect: Mood normal.        Behavior: Behavior normal.        Assessment & Plan:   Essential hypertension Her BP is well controlled on losartan.  We will continue this medication.  Statin-induced myositis Plan as above.  Statin intolerance Plan as below.  Prediabetes I want her to watch her diet and keep active with exercise.  Hyperlipidemia We will check her FLP on her next visit.  She remains on repatha at this time.    Return in about 3 months (around 11/17/2023).   Crist Fat, MD

## 2023-08-17 NOTE — Assessment & Plan Note (Signed)
Plan as above.  

## 2023-08-29 ENCOUNTER — Encounter (HOSPITAL_BASED_OUTPATIENT_CLINIC_OR_DEPARTMENT_OTHER): Payer: Self-pay

## 2023-08-29 ENCOUNTER — Ambulatory Visit (HOSPITAL_BASED_OUTPATIENT_CLINIC_OR_DEPARTMENT_OTHER)
Admission: RE | Admit: 2023-08-29 | Discharge: 2023-08-29 | Disposition: A | Discharge status: Home / Self Care | Payer: Self-pay | Source: Ambulatory Visit | Attending: Internal Medicine | Admitting: Internal Medicine

## 2023-08-29 VITALS — BP 126/85 | HR 77 | Temp 97.9°F | Resp 18 | Wt 192.0 lb

## 2023-08-29 DIAGNOSIS — J3089 Other allergic rhinitis: Secondary | ICD-10-CM | POA: Diagnosis not present

## 2023-08-29 DIAGNOSIS — R0981 Nasal congestion: Secondary | ICD-10-CM

## 2023-08-29 MED ORDER — METHYLPREDNISOLONE ACETATE 40 MG/ML IJ SUSP: 40.0000 mg | Freq: Once | INTRAMUSCULAR | Status: DC

## 2023-08-29 MED ORDER — METHYLPREDNISOLONE ACETATE 80 MG/ML IJ SUSP
40.0000 mg | Freq: Once | INTRAMUSCULAR | Status: AC
  Administered 2023-08-29: 40 mg via INTRAMUSCULAR

## 2023-08-29 NOTE — ED Triage Notes (Signed)
"  I think I have a sinus infection." 1 week ago felt ill. Now blowing yellowish/green drainage from nose.

## 2023-08-29 NOTE — Discharge Instructions (Signed)
I suspect your symptoms are due to allergies.  I gave you a steroid shot in the clinic to hopefully reduce inflammation and sinus pressure.  Continue Flonase for sinus pressure.  Add on Zyrtec at bedtime over-the-counter for further symptomatic relief.  Use Mucinex as needed to reduce sinus pressure and congestion.  Drink plenty of fluids to keep mucus thin.  Follow-up with allergist as scheduled on Wednesday.

## 2023-08-29 NOTE — ED Provider Notes (Signed)
[facility]    CSN: [id number] Arrival date & time: 08/29/23  0828      History   Chief Complaint Chief Complaint  Patient presents with   Sore Throat    Sinus pain - Entered by patient    HPI Haley Beasley is a 67 y.o. female.   Patient presents to urgent care for evaluation of sinus pressure, nasal congestion, postnasal drainage, and intermittent sore throat that started approximately 7 days ago after traveling to [place] for the Thanksgiving holiday.  States every time she travels to [place] she gets sick with similar symptoms.  History of multiple environmental allergies for which she is receiving allergy injections from asthma and allergy specialist.  Nasal congestion is yellow, green, and thick with some streaks of blood due to the dry air.  Allergy flares typically involve colored mucus per patient.  She is experiencing a little bit of sore throat but relates this to postnasal drainage.  Denies fevers, chills, cough, shortness of breath, dizziness, ear pain, rash, and recent sick contacts with similar symptoms. Using Flonase daily as recommended by allergist. She is not taking any other OTC medicines to help with symptoms.    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   Prediabetes 08/17/2023   Rash 06/15/2023   Breast cancer in female [facility]) 06/06/2023   Hypercholesterolemia 05/08/2023   Age-related osteoporosis with current pathological fracture 05/08/2023   DDD (degenerative disc disease), lumbar 05/08/2023   OSA (obstructive sleep apnea) 05/08/2023   Seasonal allergies 05/08/2023   Chronic venous insufficiency 05/08/2023    Moderate episode of recurrent major depressive disorder (HCC) 05/08/2023   GAD (generalized anxiety disorder) 05/08/2023   Closed fracture of right ankle 05/08/2023   BMI 29.0-29.9,adult 05/08/2023   History of breast cancer 05/08/2023   Hypothyroidism (acquired) 01/07/2023   Essential hypertension 01/07/2023   Age-related osteoporosis without current pathological fracture 10/06/2022   Hip pain 10/06/2022   COVID-19 08/20/2022   Strep throat 08/20/2022   Hyperlipidemia 06/25/2022   Genetic testing 02/04/2022    Past Surgical History:  Procedure Laterality Date   ABDOMINAL HYSTERECTOMY     ANKLE FRACTURE SURGERY Right    BREAST BIOPSY Left    BREAST RECONSTRUCTION     CATARACT EXTRACTION Bilateral    CHOLECYSTECTOMY     COLONOSCOPY W/ POLYPECTOMY     ENDOVENOUS ABLATION SAPHENOUS VEIN W/ LASER Right 01/2020   [place]   ENDOVENOUS ABLATION SAPHENOUS VEIN W/ LASER Left 05/2020   [place]   LYMPH NODE BIOPSY Left    AXILLARY   MASTECTOMY Bilateral    THYROIDECTOMY     TUBAL LIGATION Bilateral    WISDOM TOOTH EXTRACTION      OB History   No obstetric history on file.      Home Medications    Prior to Admission medications   Medication Sig Start Date End Date Taking? Authorizing Provider  ALPRAZolam (XANAX) 0.25 MG tablet Take 1 tablet (0.25 mg total) by mouth daily as needed. 07/27/23   Crist Fat, MD  Evolocumab [facility] SURECLICK) 140 MG/ML Ivory Broad  ADMINISTER 1 ML UNDER THE SKIN EVERY 14 DAYS 07/03/23   [place], Peter M, MD  fluticasone [facility]) 50 MCG/ACT nasal spray Place 1 spray into both nostrils daily. 12/04/22   Crist Fat, MD  losartan (COZAAR) 25 MG tablet Take 1 tablet (25 mg total) by mouth daily. 08/17/23   Crist Fat, MD  pantoprazole (PROTONIX) 40 MG tablet Take 1 tablet (40 mg total) by mouth daily. 08/17/23   Crist Fat, MD  sertraline (ZOLOFT) 25 MG tablet Take 1 tablet (25 mg total) by mouth daily. 05/08/23   Crist Fat, MD  SYNTHROID 75  MCG tablet TAKE 1 TABLET(75 MCG) BY MOUTH DAILY 06/22/23   Leonia Reader, Barbara Cower, MD  triamcinolone ointment (KENALOG) 0.5 % Apply 1 Application topically 2 (two) times daily. 06/15/23   Leak, Luetta Nutting, NP    Family History Family History  Problem Relation Age of Onset   Heart failure Mother    Colon cancer Mother 24   Heart attack Father 95   Heart disease Father    Heart disease Sister    Dementia Sister    Parkinson's disease Sister    Dementia Sister    Alzheimer's disease Sister    Heart disease Brother    Cancer Brother        UNK PRIMARY   Renal cancer Brother    Pancreatic cancer Brother    Asthma Brother    Lung cancer Brother    Brain cancer Maternal Uncle        d 32s   Renal cancer Paternal Uncle        d 40s   Heart disease Son 61       CHF   Heart attack Son    Breast cancer Cousin        dx 67s   Breast cancer Cousin        1 dx 30s-40s, 2 dx 50s   Breast cancer Cousin    Leukemia Cousin    Cancer Half-Brother        UNK PRIMARY    Social History Social History   Tobacco Use   Smoking status: Former    Current packs/day: 0.00    Types: Cigarettes    Quit date: 09/23/2007    Years since quitting: 15.9   Smokeless tobacco: Never  Vaping Use   Vaping status: Never Used  Substance Use Topics   Alcohol use: Yes    Comment: OCCASIONAL   Drug use: Not Currently     Allergies   Fluogen [influenza virus vaccine] and Penicillins   Review of Systems Review of Systems Per HPI  Physical Exam Triage Vital Signs ED Triage Vitals [08/29/23 0837]  Encounter Vitals Group     BP 126/85     Systolic BP Percentile      Diastolic BP Percentile      Pulse Rate 77     Resp 18     Temp 97.9 F (36.6 C)     Temp Source Oral     SpO2 98 %     Weight 192 lb (87.1 kg)     Height      Head Circumference      Peak Flow      Pain Score 4     Pain Loc      Pain Education      Exclude from Growth Chart    No data found.  Updated Vital Signs BP 126/85 (BP  Location: Right  Arm)   Pulse 77   Temp 97.9 F (36.6 C) (Oral)   Resp 18   Wt 192 lb (87.1 kg)   SpO2 98%   BMI 29.19 kg/m   Visual Acuity Right Eye Distance:   Left Eye Distance:   Bilateral Distance:    Right Eye Near:   Left Eye Near:    Bilateral Near:     Physical Exam Vitals and nursing note reviewed.  Constitutional:      Appearance: Normal appearance. She is not ill-appearing or toxic-appearing.  HENT:     Head: Normocephalic and atraumatic.     Right Ear: Hearing, tympanic membrane, ear canal and external ear normal.     Left Ear: Hearing, tympanic membrane, ear canal and external ear normal.     Nose: Congestion present.     Right Turbinates: Swollen and pale.     Left Turbinates: Swollen and pale.     Right Sinus: Maxillary sinus tenderness and frontal sinus tenderness present.     Left Sinus: Maxillary sinus tenderness and frontal sinus tenderness present.     Comments: Diffuse and non-focal tenderness to the sinuses with palpation.     Mouth/Throat:     Lips: Pink.     Mouth: Mucous membranes are moist. No injury.     Tongue: No lesions. Tongue does not deviate from midline.     Palate: No mass and lesions.     Pharynx: Oropharynx is clear. Uvula midline. No pharyngeal swelling, oropharyngeal exudate, posterior oropharyngeal erythema or uvula swelling.     Tonsils: No tonsillar exudate or tonsillar abscesses.     Comments: Mild erythema to posterior oropharynx with small amount of clear postnasal drainage visualized. Eyes:     General: Lids are normal. Vision grossly intact. Gaze aligned appropriately.     Extraocular Movements: Extraocular movements intact.     Conjunctiva/sclera: Conjunctivae normal.  Cardiovascular:     Rate and Rhythm: Normal rate and regular rhythm.     Heart sounds: Normal heart sounds, S1 normal and S2 normal.  Pulmonary:     Effort: Pulmonary effort is normal. No respiratory distress.     Breath sounds: Normal breath sounds and  air entry. No wheezing, rhonchi or rales.  Chest:     Chest wall: No tenderness.  Musculoskeletal:     Cervical back: Neck supple.  Lymphadenopathy:     Cervical: No cervical adenopathy.  Skin:    General: Skin is warm and dry.     Capillary Refill: Capillary refill takes less than 2 seconds.     Findings: No rash.  Neurological:     General: No focal deficit present.     Mental Status: She is alert and oriented to person, place, and time. Mental status is at baseline.     Cranial Nerves: No dysarthria or facial asymmetry.  Psychiatric:        Mood and Affect: Mood normal.        Speech: Speech normal.        Behavior: Behavior normal.        Thought Content: Thought content normal.        Judgment: Judgment normal.      UC Treatments / Results  Labs (all labs ordered are listed, but only abnormal results are displayed) Labs Reviewed - No data to display  EKG   Radiology No results found.  Procedures Procedures (including critical care time)  Medications Ordered in UC Medications  methylPREDNISolone acetate (DEPO-MEDROL) injection 40  mg (40 mg Intramuscular Given 08/29/23 0858)    Initial Impression / Assessment and Plan / UC Course  I have reviewed the triage vital signs and the nursing notes.  Pertinent labs & imaging results that were available during my care of the patient were reviewed by me and considered in my medical decision making (see chart for details).   1. Non-seasonal allergic rhinitis, nasal sinus congestion Presentation suspicious for allergy flare triggered by recent trip to [place] and environmental triggers. Low suspicion for acute bacterial sinusitis, therefore will defer use of antibiotic at this time.  I would like to treat with Depo-Medrol 40 mg IM to reduce sinus pressure and inflammation related to allergic rhinitis flare. She takes Flonase daily, would like to add on Zyrtec daily. Warm compresses to the maxillary sinuses may help reduce  sinus pressure. Recommend saline nasal spray as well as over-the-counter Mucinex to further reduce sinus pressure. Follow-up with allergy specialist as scheduled on Wednesday, September 02, 2023 for ongoing evaluation and management of this.  Counseled patient on potential for adverse effects with medications prescribed/recommended today, strict ER and return-to-clinic precautions discussed, patient verbalized understanding.    Final Clinical Impressions(s) / UC Diagnoses   Final diagnoses:  Non-seasonal allergic rhinitis, unspecified trigger  Nasal sinus congestion     Discharge Instructions      I suspect your symptoms are due to allergies.  I gave you a steroid shot in the clinic to hopefully reduce inflammation and sinus pressure.  Continue Flonase for sinus pressure.  Add on Zyrtec at bedtime over-the-counter for further symptomatic relief.  Use Mucinex as needed to reduce sinus pressure and congestion.  Drink plenty of fluids to keep mucus thin.  Follow-up with allergist as scheduled on Wednesday.     ED Prescriptions   None    PDMP not reviewed this encounter.   Carlisle Beers, [place] 08/29/23 [phone]

## 2023-09-02 DIAGNOSIS — G4700 Insomnia, unspecified: Secondary | ICD-10-CM | POA: Diagnosis not present

## 2023-09-02 DIAGNOSIS — Z87891 Personal history of nicotine dependence: Secondary | ICD-10-CM | POA: Diagnosis not present

## 2023-09-02 DIAGNOSIS — J301 Allergic rhinitis due to pollen: Secondary | ICD-10-CM | POA: Diagnosis not present

## 2023-09-02 DIAGNOSIS — G4733 Obstructive sleep apnea (adult) (pediatric): Secondary | ICD-10-CM | POA: Diagnosis not present

## 2023-09-28 ENCOUNTER — Other Ambulatory Visit: Payer: Self-pay

## 2023-09-28 DIAGNOSIS — I2510 Atherosclerotic heart disease of native coronary artery without angina pectoris: Secondary | ICD-10-CM

## 2023-09-28 DIAGNOSIS — E785 Hyperlipidemia, unspecified: Secondary | ICD-10-CM

## 2023-09-28 MED ORDER — REPATHA SURECLICK 140 MG/ML ~~LOC~~ SOAJ: 140.0000 mg | SUBCUTANEOUS | 0 refills | Status: DC

## 2023-09-28 NOTE — Progress Notes (Signed)
 Rx Refill

## 2023-10-03 ENCOUNTER — Inpatient Hospital Stay (HOSPITAL_COMMUNITY): Payer: Medicare HMO

## 2023-10-03 ENCOUNTER — Emergency Department (HOSPITAL_COMMUNITY): Payer: Medicare HMO

## 2023-10-03 ENCOUNTER — Other Ambulatory Visit: Payer: Self-pay

## 2023-10-03 ENCOUNTER — Encounter (HOSPITAL_COMMUNITY): Payer: Self-pay | Admitting: Student in an Organized Health Care Education/Training Program

## 2023-10-03 ENCOUNTER — Inpatient Hospital Stay (HOSPITAL_COMMUNITY)
Admission: EM | Admit: 2023-10-03 | Discharge: 2023-10-05 | DRG: 062 | Disposition: A | Discharge status: Home / Self Care | Payer: Medicare HMO | Attending: Student in an Organized Health Care Education/Training Program | Admitting: Student in an Organized Health Care Education/Training Program

## 2023-10-03 DIAGNOSIS — R29706 NIHSS score 6: Secondary | ICD-10-CM | POA: Diagnosis present

## 2023-10-03 DIAGNOSIS — Z79890 Hormone replacement therapy (postmenopausal): Secondary | ICD-10-CM

## 2023-10-03 DIAGNOSIS — R29818 Other symptoms and signs involving the nervous system: Secondary | ICD-10-CM | POA: Diagnosis not present

## 2023-10-03 DIAGNOSIS — R531 Weakness: Secondary | ICD-10-CM | POA: Diagnosis not present

## 2023-10-03 DIAGNOSIS — I6389 Other cerebral infarction: Secondary | ICD-10-CM

## 2023-10-03 DIAGNOSIS — Z8249 Family history of ischemic heart disease and other diseases of the circulatory system: Secondary | ICD-10-CM | POA: Diagnosis not present

## 2023-10-03 DIAGNOSIS — I10 Essential (primary) hypertension: Secondary | ICD-10-CM | POA: Diagnosis present

## 2023-10-03 DIAGNOSIS — I6523 Occlusion and stenosis of bilateral carotid arteries: Secondary | ICD-10-CM | POA: Diagnosis not present

## 2023-10-03 DIAGNOSIS — E890 Postprocedural hypothyroidism: Secondary | ICD-10-CM | POA: Diagnosis not present

## 2023-10-03 DIAGNOSIS — Z803 Family history of malignant neoplasm of breast: Secondary | ICD-10-CM

## 2023-10-03 DIAGNOSIS — Z887 Allergy status to serum and vaccine status: Secondary | ICD-10-CM

## 2023-10-03 DIAGNOSIS — Z820 Family history of epilepsy and other diseases of the nervous system: Secondary | ICD-10-CM

## 2023-10-03 DIAGNOSIS — Z9013 Acquired absence of bilateral breasts and nipples: Secondary | ICD-10-CM | POA: Diagnosis not present

## 2023-10-03 DIAGNOSIS — Z8616 Personal history of COVID-19: Secondary | ICD-10-CM | POA: Diagnosis not present

## 2023-10-03 DIAGNOSIS — I639 Cerebral infarction, unspecified: Secondary | ICD-10-CM | POA: Diagnosis not present

## 2023-10-03 DIAGNOSIS — G8194 Hemiplegia, unspecified affecting left nondominant side: Secondary | ICD-10-CM | POA: Diagnosis present

## 2023-10-03 DIAGNOSIS — Z825 Family history of asthma and other chronic lower respiratory diseases: Secondary | ICD-10-CM

## 2023-10-03 DIAGNOSIS — Z800 Family history of malignant neoplasm of digestive organs: Secondary | ICD-10-CM

## 2023-10-03 DIAGNOSIS — Z87891 Personal history of nicotine dependence: Secondary | ICD-10-CM | POA: Diagnosis not present

## 2023-10-03 DIAGNOSIS — Z79899 Other long term (current) drug therapy: Secondary | ICD-10-CM

## 2023-10-03 DIAGNOSIS — G4733 Obstructive sleep apnea (adult) (pediatric): Secondary | ICD-10-CM | POA: Diagnosis not present

## 2023-10-03 DIAGNOSIS — M51369 Other intervertebral disc degeneration, lumbar region without mention of lumbar back pain or lower extremity pain: Secondary | ICD-10-CM | POA: Diagnosis present

## 2023-10-03 DIAGNOSIS — Z808 Family history of malignant neoplasm of other organs or systems: Secondary | ICD-10-CM

## 2023-10-03 DIAGNOSIS — Z8051 Family history of malignant neoplasm of kidney: Secondary | ICD-10-CM

## 2023-10-03 DIAGNOSIS — Z853 Personal history of malignant neoplasm of breast: Secondary | ICD-10-CM | POA: Diagnosis not present

## 2023-10-03 DIAGNOSIS — J432 Centrilobular emphysema: Secondary | ICD-10-CM | POA: Diagnosis not present

## 2023-10-03 DIAGNOSIS — I700 Atherosclerosis of aorta: Secondary | ICD-10-CM | POA: Diagnosis not present

## 2023-10-03 DIAGNOSIS — R202 Paresthesia of skin: Secondary | ICD-10-CM | POA: Diagnosis not present

## 2023-10-03 DIAGNOSIS — K219 Gastro-esophageal reflux disease without esophagitis: Secondary | ICD-10-CM | POA: Diagnosis present

## 2023-10-03 DIAGNOSIS — Z880 Allergy status to penicillin: Secondary | ICD-10-CM

## 2023-10-03 DIAGNOSIS — G4489 Other headache syndrome: Secondary | ICD-10-CM | POA: Diagnosis not present

## 2023-10-03 DIAGNOSIS — I6381 Other cerebral infarction due to occlusion or stenosis of small artery: Principal | ICD-10-CM | POA: Diagnosis present

## 2023-10-03 DIAGNOSIS — R29810 Facial weakness: Secondary | ICD-10-CM | POA: Diagnosis not present

## 2023-10-03 DIAGNOSIS — Z0389 Encounter for observation for other suspected diseases and conditions ruled out: Secondary | ICD-10-CM | POA: Diagnosis not present

## 2023-10-03 DIAGNOSIS — Z888 Allergy status to other drugs, medicaments and biological substances status: Secondary | ICD-10-CM

## 2023-10-03 DIAGNOSIS — Z806 Family history of leukemia: Secondary | ICD-10-CM

## 2023-10-03 DIAGNOSIS — E785 Hyperlipidemia, unspecified: Secondary | ICD-10-CM | POA: Diagnosis not present

## 2023-10-03 DIAGNOSIS — Z9049 Acquired absence of other specified parts of digestive tract: Secondary | ICD-10-CM

## 2023-10-03 DIAGNOSIS — Z801 Family history of malignant neoplasm of trachea, bronchus and lung: Secondary | ICD-10-CM

## 2023-10-03 LAB — I-STAT CHEM 8, ED
BUN: 15 mg/dL (ref 8–23)
Calcium, Ion: 1.1 mmol/L — ABNORMAL LOW (ref 1.15–1.40)
Chloride: 105 mmol/L (ref 98–111)
Creatinine, Ser: 0.7 mg/dL (ref 0.44–1.00)
Glucose, Bld: 108 mg/dL — ABNORMAL HIGH (ref 70–99)
HCT: 40 % (ref 36.0–46.0)
Hemoglobin: 13.6 g/dL (ref 12.0–15.0)
Potassium: 3.6 mmol/L (ref 3.5–5.1)
Sodium: 137 mmol/L (ref 135–145)
TCO2: 21 mmol/L — ABNORMAL LOW (ref 22–32)

## 2023-10-03 LAB — COMPREHENSIVE METABOLIC PANEL
ALT: 17 U/L (ref 0–44)
AST: 19 U/L (ref 15–41)
Albumin: 3.9 g/dL (ref 3.5–5.0)
Alkaline Phosphatase: 57 U/L (ref 38–126)
Anion gap: 11 (ref 5–15)
BUN: 13 mg/dL (ref 8–23)
CO2: 22 mmol/L (ref 22–32)
Calcium: 9.3 mg/dL (ref 8.9–10.3)
Chloride: 105 mmol/L (ref 98–111)
Creatinine, Ser: 0.78 mg/dL (ref 0.44–1.00)
GFR, Estimated: >60 mL/min (ref >60)
Glucose, Bld: 112 mg/dL — ABNORMAL HIGH (ref 70–99)
Potassium: 3.6 mmol/L (ref 3.5–5.1)
Sodium: 138 mmol/L (ref 135–145)
Total Bilirubin: 0.6 mg/dL (ref 0.0–1.2)
Total Protein: 7.5 g/dL (ref 6.5–8.1)

## 2023-10-03 LAB — PROTIME-INR
INR: 0.9 (ref 0.8–1.2)
Prothrombin Time: 12.4 s (ref 11.4–15.2)

## 2023-10-03 LAB — DIFFERENTIAL
Abs Immature Granulocytes: 0.05 10*3/uL (ref 0.00–0.07)
Basophils Absolute: 0.1 10*3/uL (ref 0.0–0.1)
Basophils Relative: 1 %
Eosinophils Absolute: 0.1 10*3/uL (ref 0.0–0.5)
Eosinophils Relative: 2 %
Immature Granulocytes: 1 %
Lymphocytes Relative: 31 %
Lymphs Abs: 2.3 10*3/uL (ref 0.7–4.0)
Monocytes Absolute: 0.5 10*3/uL (ref 0.1–1.0)
Monocytes Relative: 7 %
Neutro Abs: 4.2 10*3/uL (ref 1.7–7.7)
Neutrophils Relative %: 58 %

## 2023-10-03 LAB — CBG MONITORING, ED: Glucose-Capillary: 94 mg/dL (ref 70–99)

## 2023-10-03 LAB — CBC
HCT: 38.5 % (ref 36.0–46.0)
Hemoglobin: 13 g/dL (ref 12.0–15.0)
MCH: 30.6 pg (ref 26.0–34.0)
MCHC: 33.8 g/dL (ref 30.0–36.0)
MCV: 90.6 fL (ref 80.0–100.0)
Platelets: 335 10*3/uL (ref 150–400)
RBC: 4.25 MIL/uL (ref 3.87–5.11)
RDW: 13.5 % (ref 11.5–15.5)
WBC: 7.2 10*3/uL (ref 4.0–10.5)
nRBC: 0 % (ref 0.0–0.2)

## 2023-10-03 LAB — HIV ANTIBODY (ROUTINE TESTING W REFLEX): HIV Screen 4th Generation wRfx: NONREACTIVE

## 2023-10-03 LAB — MRSA NEXT GEN BY PCR, NASAL: MRSA by PCR Next Gen: NOT DETECTED

## 2023-10-03 LAB — ECHOCARDIOGRAM COMPLETE
Height: 68 in
S' Lateral: 2.5 cm
Weight: 3033.53 [oz_av]

## 2023-10-03 LAB — APTT: aPTT: 34 s (ref 24–36)

## 2023-10-03 LAB — ETHANOL: Alcohol, Ethyl (B): <10 mg/dL (ref <10)

## 2023-10-03 MED ORDER — CLEVIDIPINE BUTYRATE 0.5 MG/ML IV EMUL
0.0000 mg/h | INTRAVENOUS | Status: DC
  Filled 2023-10-03: qty 100

## 2023-10-03 MED ORDER — ALPRAZOLAM 0.25 MG PO TABS
0.2500 mg | ORAL_TABLET | Freq: Every evening | ORAL | Status: DC | PRN
  Administered 2023-10-03 – 2023-10-04 (×2): 0.25 mg via ORAL
  Filled 2023-10-03 (×2): qty 1

## 2023-10-03 MED ORDER — CHLORHEXIDINE GLUCONATE CLOTH 2 % EX PADS
6.0000 | MEDICATED_PAD | Freq: Every day | CUTANEOUS | Status: DC
  Administered 2023-10-03 – 2023-10-04 (×2): 6 via TOPICAL

## 2023-10-03 MED ORDER — TENECTEPLASE FOR STROKE
0.2500 mg/kg | PACK | Freq: Once | INTRAVENOUS | Status: AC
  Administered 2023-10-03: 22 mg via INTRAVENOUS

## 2023-10-03 MED ORDER — ACETAMINOPHEN 650 MG RE SUPP
650.0000 mg | RECTAL | Status: DC | PRN
Start: 2023-10-03 — End: 2023-10-05

## 2023-10-03 MED ORDER — STROKE: EARLY STAGES OF RECOVERY BOOK
Freq: Once | Status: AC
Start: 2023-10-04 — End: 2023-10-04
  Filled 2023-10-03: qty 1

## 2023-10-03 MED ORDER — LABETALOL HCL 5 MG/ML IV SOLN
20.0000 mg | Freq: Once | INTRAVENOUS | Status: DC
  Filled 2023-10-03: qty 4

## 2023-10-03 MED ORDER — SODIUM CHLORIDE 0.9% FLUSH: 3.0000 mL | Freq: Once | INTRAVENOUS | Status: DC

## 2023-10-03 MED ORDER — ACETAMINOPHEN 160 MG/5ML PO SOLN: 650.0000 mg | ORAL | Status: DC | PRN

## 2023-10-03 MED ORDER — PANTOPRAZOLE SODIUM 40 MG IV SOLR: 40.0000 mg | Freq: Every day | INTRAVENOUS | Status: DC

## 2023-10-03 MED ORDER — ORAL CARE MOUTH RINSE: 15.0000 mL | OROMUCOSAL | Status: DC | PRN

## 2023-10-03 MED ORDER — SODIUM CHLORIDE 0.9 % IV SOLN: INTRAVENOUS | Status: DC

## 2023-10-03 MED ORDER — PANTOPRAZOLE SODIUM 40 MG PO TBEC
40.0000 mg | DELAYED_RELEASE_TABLET | Freq: Every day | ORAL | Status: DC
  Administered 2023-10-03 – 2023-10-04 (×2): 40 mg via ORAL
  Filled 2023-10-03 (×2): qty 1

## 2023-10-03 MED ORDER — SENNOSIDES-DOCUSATE SODIUM 8.6-50 MG PO TABS: 1.0000 | ORAL_TABLET | Freq: Every evening | ORAL | Status: DC | PRN

## 2023-10-03 MED ORDER — ACETAMINOPHEN 325 MG PO TABS
650.0000 mg | ORAL_TABLET | ORAL | Status: DC | PRN
  Administered 2023-10-03 – 2023-10-05 (×7): 650 mg via ORAL
  Filled 2023-10-03 (×7): qty 2

## 2023-10-03 MED ORDER — IOHEXOL 350 MG/ML SOLN
75.0000 mL | Freq: Once | INTRAVENOUS | Status: AC | PRN
  Administered 2023-10-03: 75 mL via INTRAVENOUS

## 2023-10-03 NOTE — ED Provider Notes (Signed)
 [place] [facility] AT [facility] Provider Note   CSN: [id number] Arrival date & time: 10/03/23  1010     History  No chief complaint on file.   Haley Beasley is a 68 y.o. female.  68 yo F with a chief complaints of left-sided weakness.  She woke up this morning and was normal and then approximately 2 hours ago developed left-sided numbness and weakness.  Had up calling EMS.  Arrived here as a code stroke.  Level 5 caveat acuity of condition.        Home Medications Prior to Admission medications   Medication Sig Start Date End Date Taking? Authorizing Provider  ALPRAZolam  (XANAX ) 0.25 MG tablet Take 1 tablet (0.25 mg total) by mouth daily as needed. 07/27/23   Fleeta Valeria Mayo, MD  Evolocumab  (REPATHA  SURECLICK) 140 MG/ML SOAJ Inject 140 mg as directed every 14 (fourteen) days. 09/28/23   Fleeta Valeria Mayo, MD  fluticasone  (FLONASE ) 50 MCG/ACT nasal spray Place 1 spray into both nostrils daily. 12/04/22   Fleeta Valeria Mayo, MD  losartan  (COZAAR ) 25 MG tablet Take 1 tablet (25 mg total) by mouth daily. 08/17/23   Fleeta Valeria Mayo, MD  pantoprazole  (PROTONIX ) 40 MG tablet Take 1 tablet (40 mg total) by mouth daily. 08/17/23   Fleeta Valeria Mayo, MD  sertraline  (ZOLOFT ) 25 MG tablet Take 1 tablet (25 mg total) by mouth daily. 05/08/23   Fleeta Valeria Mayo, MD  SYNTHROID  75 MCG tablet TAKE 1 TABLET(75 MCG) BY MOUTH DAILY 06/22/23   Fleeta Valeria, Mayo, MD  triamcinolone  ointment (KENALOG ) 0.5 % Apply 1 Application topically 2 (two) times daily. 06/15/23   Leak, Brandi L, NP      Allergies    Fluogen [influenza virus vaccine] and Penicillins    Review of Systems   Review of Systems  Physical Exam Updated Vital Signs Wt 86 kg   BMI 28.83 kg/m  Physical Exam Vitals and nursing note reviewed.  Constitutional:      General: She is not in acute distress.    Appearance: She is well-developed. She is not diaphoretic.  HENT:     Head: Normocephalic and atraumatic.  Eyes:      Pupils: Pupils are equal, round, and reactive to light.  Cardiovascular:     Rate and Rhythm: Normal rate and regular rhythm.     Heart sounds: No murmur heard.    No friction rub. No gallop.  Pulmonary:     Effort: Pulmonary effort is normal.     Breath sounds: No wheezing or rales.  Abdominal:     General: There is no distension.     Palpations: Abdomen is soft.     Tenderness: There is no abdominal tenderness.  Musculoskeletal:        General: No tenderness.     Cervical back: Normal range of motion and neck supple.  Skin:    General: Skin is warm and dry.  Neurological:     Mental Status: She is alert and oriented to person, place, and time.     Comments: Left upper extremity weakness.    Psychiatric:        Behavior: Behavior normal.     ED Results / Procedures / Treatments   Labs (all labs ordered are listed, but only abnormal results are displayed) Labs Reviewed  I-STAT CHEM 8, ED - Abnormal; Notable for the following components:      Result Value   Glucose, Bld 108 (*)  Calcium , Ion 1.10 (*)    TCO2 21 (*)    All other components within normal limits  PROTIME-INR  APTT  CBC  DIFFERENTIAL  COMPREHENSIVE METABOLIC PANEL  ETHANOL  HIV ANTIBODY (ROUTINE TESTING W REFLEX)  CBG MONITORING, ED    EKG None  Radiology No results found.  Procedures .Critical Care  Performed by: Emil Share, DO Authorized by: Emil Share, DO   Critical care provider statement:    Critical care time (minutes):  35   Critical care time was exclusive of:  Separately billable procedures and treating other patients   Critical care was time spent personally by me on the following activities:  Development of treatment plan with patient or surrogate, discussions with consultants, evaluation of patient's response to treatment, examination of patient, ordering and review of laboratory studies, ordering and review of radiographic studies, ordering and performing treatments and  interventions, pulse oximetry, re-evaluation of patient's condition and review of old charts   Care discussed with: admitting provider       Medications Ordered in ED Medications  sodium chloride  flush (NS) 0.9 % injection 3 mL (has no administration in time range)  tenecteplase  (TNKASE ) injection for Stroke 22 mg (has no administration in time range)   stroke: early stages of recovery book (has no administration in time range)  0.9 %  sodium chloride  infusion (has no administration in time range)  acetaminophen  (TYLENOL ) tablet 650 mg (has no administration in time range)    Or  acetaminophen  (TYLENOL ) 160 MG/5ML solution 650 mg (has no administration in time range)    Or  acetaminophen  (TYLENOL ) suppository 650 mg (has no administration in time range)  senna-docusate (Senokot-S) tablet 1 tablet (has no administration in time range)  pantoprazole  (PROTONIX ) injection 40 mg (has no administration in time range)  labetalol  (NORMODYNE ) injection 20 mg (has no administration in time range)    And  clevidipine  (CLEVIPREX ) infusion 0.5 mg/mL (has no administration in time range)    ED Course/ Medical Decision Making/ A&P                                 Medical Decision Making Amount and/or Complexity of Data Reviewed Labs: ordered. Radiology: ordered.   68 yo F with a chief complaints of left-sided weakness.  Started acutely this morning.  Arrived as a code stroke.  Airway was cleared at the bridge.  Patient was given thrombolytics.  Neurology admission.  The patients results and plan were reviewed and discussed.   Any x-rays performed were independently reviewed by myself.   Differential diagnosis were considered with the presenting HPI.  Medications  sodium chloride  flush (NS) 0.9 % injection 3 mL (has no administration in time range)  tenecteplase  (TNKASE ) injection for Stroke 22 mg (has no administration in time range)   stroke: early stages of recovery book (has no  administration in time range)  0.9 %  sodium chloride  infusion (has no administration in time range)  acetaminophen  (TYLENOL ) tablet 650 mg (has no administration in time range)    Or  acetaminophen  (TYLENOL ) 160 MG/5ML solution 650 mg (has no administration in time range)    Or  acetaminophen  (TYLENOL ) suppository 650 mg (has no administration in time range)  senna-docusate (Senokot-S) tablet 1 tablet (has no administration in time range)  pantoprazole  (PROTONIX ) injection 40 mg (has no administration in time range)  labetalol  (NORMODYNE ) injection 20 mg (has no administration in  time range)    And  clevidipine  (CLEVIPREX ) infusion 0.5 mg/mL (has no administration in time range)    Vitals:   10/03/23 1000  Weight: 86 kg    Final diagnoses:  Acute ischemic stroke [facility])    Admission/ observation were discussed with the admitting physician, patient and/or family and they are comfortable with the plan.          Final Clinical Impression(s) / ED Diagnoses Final diagnoses:  Acute ischemic stroke [facility])    Rx / DC Orders ED Discharge Orders     None         Emil Share, DO 10/03/23 1023

## 2023-10-03 NOTE — Progress Notes (Signed)
 SLP Cancellation Note  Patient Details Name: Haley Beasley MRN: [id number] DOB: March 21, 1956   Cancelled treatment:       Reason Eval/Treat Not Completed: SLP screened, no needs identified, will sign off SLP spoke briefly with patient. She is not presenting with any cognitive, linguistic or speech impairments. She denies any symptoms or changes in these areas as well. Thank you for this consult!   Norleen IVAR Blase, MA, CCC-SLP Speech Therapy

## 2023-10-03 NOTE — Progress Notes (Signed)
Report obtained from ED.

## 2023-10-03 NOTE — ED Triage Notes (Signed)
 Pt bib ems from home c/o left sided weakness and left facial droop. LKW 0900 Pt states she has a headache 5/10    BP 180/100 RA 98% HR 90 CBG 111

## 2023-10-03 NOTE — Progress Notes (Signed)
 PHARMACIST CODE STROKE RESPONSE  Notified to mix TNK at 10:19 by Dr. Wilford Corner  TNK preparation completed at 10:20  TNK dose = 22 mg IV over 5 seconds.   Issues/delays encountered (if applicable): n/a   Haley Beasley 10/03/23 10:22 AM

## 2023-10-03 NOTE — H&P (Signed)
 NEUROLOGY H&P NOTE   Date of service: October 03, 2023 Patient Name: Haley Beasley MRN:  [id number] DOB:  [birth year] Chief Complaint: left sided weakness - code stroke via [facility]  History of Present Illness  Haley Beasley is a 68 y.o. female  has a past medical history of Anxiety, Breast cancer (HCC), Depression, GERD (gastroesophageal reflux disease), Hyperlipidemia, Hypertension, Hypothyroidism, Memory loss, and OSA (obstructive sleep apnea).  No prior history of strokes, not on blood thinners presenting with sudden onset of left-sided numbness and weakness that started suddenly at 9 AM.  She woke up normal this morning and was going about her usual morning which was when she had a sudden onset of left-sided weakness.  Brought in by [facility] for emergent evaluation.  Seen on the bridge. See detailed exam and NIH below. No recent surgeries.  No bleeding or clotting disorders that she knows of.  No chest pain or shortness of breath and no sick contacts.  Last known well: 9 AM Modified rankin score: 0-Completely asymptomatic and back to baseline post- stroke IV Thrombolysis: Yes or No (reason) yes Thrombectomy: Yes or no (reason) exam not consistent with LVO NIHSS components Score: Comment  1a Level of Conscious 0[x]  1[]  2[]  3[]      1b LOC Questions 0[x]  1[]  2[]       1c LOC Commands 0[x]  1[]  2[]       2 Best Gaze 0[x]  1[]  2[]       3 Visual 0[x]  1[]  2[]  3[]      4 Facial Palsy 0[x]  1[]  2[]  3[]      5a Motor Arm - left 0[]  1[x]  2[]  3[]  4[]  UN[]    5b Motor Arm - Right 0[x]  1[]  2[]  3[]  4[]  UN[]    6a Motor Leg - Left 0[]  1[x]  2[]  3[]  4[]  UN[]    6b Motor Leg - Right 0[x]  1[]  2[]  3[]  4[]  UN[]    7 Limb Ataxia 0[x]  1[]  2[]  3[]  UN[]     8 Sensory 0[]  1[]  2[x]  UN[]      9 Best Language 0[x]  1[]  2[]  3[]      10 Dysarthria 0[x]  1[]  2[]  UN[]      11 Extinct. and Inattention 0[x]  1[]  2[]       TOTAL: 4      ROS  Comprehensive ROS performed and pertinent positives documented in the  HPI   Past History   Past Medical History:  Diagnosis Date   Anxiety    Breast cancer (HCC)    Depression    GERD (gastroesophageal reflux disease)    Hyperlipidemia    Hypertension    Hypothyroidism    Memory loss    OSA (obstructive sleep apnea)    Past Surgical History:  Procedure Laterality Date   ABDOMINAL HYSTERECTOMY     ANKLE FRACTURE SURGERY Right    BREAST BIOPSY Left    BREAST RECONSTRUCTION     CATARACT EXTRACTION Bilateral    CHOLECYSTECTOMY     COLONOSCOPY W/ POLYPECTOMY     ENDOVENOUS ABLATION SAPHENOUS VEIN W/ LASER Right 01/2020   [place]    ENDOVENOUS ABLATION SAPHENOUS VEIN W/ LASER Left 05/2020   [place]    LYMPH NODE BIOPSY Left    AXILLARY   MASTECTOMY Bilateral    THYROIDECTOMY     TUBAL LIGATION Bilateral    WISDOM TOOTH EXTRACTION     Family History  Problem Relation Age of Onset   Heart failure Mother    Colon cancer Mother 79   Heart attack Father 16   Heart disease Father  Heart disease Sister    Dementia Sister    Parkinson's disease Sister    Dementia Sister    Alzheimer's disease Sister    Heart disease Brother    Cancer Brother        UNK PRIMARY   Renal cancer Brother    Pancreatic cancer Brother    Asthma Brother    Lung cancer Brother    Brain cancer Maternal Uncle        d 19s   Renal cancer Paternal Uncle        d 66s   Heart disease Son 50       CHF   Heart attack Son    Breast cancer Cousin        dx 32s   Breast cancer Cousin        1 dx 30s-40s, 2 dx 55s   Breast cancer Cousin    Leukemia Cousin    Cancer Half-Brother        UNK PRIMARY   Social History   Socioeconomic History   Marital status: Married    Spouse name: Franky   Number of children: 2   Years of education: 12   Highest education level: Not on file  Occupational History   Occupation: RETIRED INSURANCE AGENT  Tobacco Use   Smoking status: Former    Current packs/day: 0.00    Types: Cigarettes    Quit date: 09/23/2007    Years since  quitting: 16.0   Smokeless tobacco: Never  Vaping Use   Vaping status: Never Used  Substance and Sexual Activity   Alcohol use: Yes    Comment: OCCASIONAL   Drug use: Not Currently   Sexual activity: Yes  Other Topics Concern   Not on file  Social History Narrative   Lives with husband   Social Drivers of Health   Financial Resource Strain: Not on file  Food Insecurity: No Food Insecurity (06/04/2023)   Hunger Vital Sign    Worried About Running Out of Food in the Last Year: Never true    Ran Out of Food in the Last Year: Never true  Transportation Needs: No Transportation Needs (06/04/2023)   PRAPARE - Administrator, Civil Service (Medical): No    Lack of Transportation (Non-Medical): No  Physical Activity: Not on file  Stress: Not on file  Social Connections: Not on file   Allergies  Allergen Reactions   Fluogen [Influenza Virus Vaccine]    Penicillins     Medications   Current Facility-Administered Medications:    sodium chloride  flush (NS) 0.9 % injection 3 mL, 3 mL, Intravenous, Once, Emil Share, DO  Current Outpatient Medications:    ALPRAZolam  (XANAX ) 0.25 MG tablet, Take 1 tablet (0.25 mg total) by mouth daily as needed., Disp: 30 tablet, Rfl: 2   Evolocumab  (REPATHA  SURECLICK) 140 MG/ML SOAJ, Inject 140 mg as directed every 14 (fourteen) days., Disp: 2 mL, Rfl: 0   fluticasone  (FLONASE ) 50 MCG/ACT nasal spray, Place 1 spray into both nostrils daily., Disp: 16 g, Rfl: 3   losartan  (COZAAR ) 25 MG tablet, Take 1 tablet (25 mg total) by mouth daily., Disp: 90 tablet, Rfl: 1   pantoprazole  (PROTONIX ) 40 MG tablet, Take 1 tablet (40 mg total) by mouth daily., Disp: 90 tablet, Rfl: 0   sertraline  (ZOLOFT ) 25 MG tablet, Take 1 tablet (25 mg total) by mouth daily., Disp: 90 tablet, Rfl: 3   SYNTHROID  75 MCG tablet, TAKE 1 TABLET(75 MCG) BY  MOUTH DAILY, Disp: 90 tablet, Rfl: 1   triamcinolone  ointment (KENALOG ) 0.5 %, Apply 1 Application topically 2 (two)  times daily., Disp: 30 g, Rfl: 0   Vitals   There were no vitals filed for this visit.   There is no height or weight on file to calculate BMI.  Physical Exam   General: Well-developed well-nourished woman in no acute distress HEENT: Normocephalic atraumatic Lungs: Clear Heart vascular: Regular rhythm Abdomen nondistended nontender Neurological examination awake alert oriented x 3 No dysarthria No evidence of aphasia Cranial nerves: Pupils equal round react light, extract movements intact, visual field full, facial sensation diminished on the left in comparison to the right, face appears symmetric, tongue and palate midline. Motor examination with left upper and lower extremity drift with left leg somewhat weaker than the left arm.  Right side is full strength. Sensation diminished on the left side in comparison to the right. No gross ataxia Gait testing deferred CN VIII stroke scale above  Labs   CBC:  Recent Labs  Lab 10/03/23 1016 10/03/23 1018  WBC 7.2  --   NEUTROABS 4.2  --   HGB 13.0 13.6  HCT 38.5 40.0  MCV 90.6  --   PLT 335  --    Basic Metabolic Panel:  Lab Results  Component Value Date   NA 137 10/03/2023   K 3.6 10/03/2023   CO2 21 05/08/2023   GLUCOSE 108 (H) 10/03/2023   BUN 15 10/03/2023   CREATININE 0.70 10/03/2023   CALCIUM  9.5 05/08/2023   Lipid Panel:  Lab Results  Component Value Date   LDLCALC 134 (H) 05/08/2023   HgbA1c:  Lab Results  Component Value Date   HGBA1C 5.9 (H) 05/08/2023   CT Head without contrast(Personally reviewed): Aspects 10.  No bleed  CT angio Head and Neck with contrast(Personally reviewed): Preliminary review negative for emergent LVO.  Formal read from radiology pending.  Assessment   Hava Massingale Hodgkins is a 68 y.o. female with above past medical history presenting for sudden onset of left-sided numbness and weakness that started all of a sudden at 9 AM. No contraindications for IV TNKase  Clinical exam  consistent with left-sided hemiparesis and hemisensory loss concerning for acute ischemic stroke. Risks benefits and alternatives of IV TNKase  discussed. CT imaging reviewed prior to TNK administration after patient agreed for it after evaluation of the risk benefits and alternatives.   Impression: Acute ischemic stroke status post IV TNKase   Recommendations    Acuity: Acute Current Suspected Etiology: Likely small vessel disease given hypertension and hyperlipidemia history Continue Evaluation:  -Admit to: Neurological ICU -Hold Aspirin  until 24 hour post IV thrombolysis (tPA or TNKase ) neuroimaging is stable and without evidence of bleeding -Blood pressure control, goal of <180/105 post TNK -ECHO/A1C/Lipid panel. -Hyperglycemia management per SSI to maintain glucose 140-180mg /dL. -PT/OT/ST therapies and recommendations when able -Post TNK vitals and neurochecks  CNS Therapy assessments Post TNK neurochecks and vitals 24-hour MRI at 10 AM tomorrow  Hemiplegia and hemiparesis following cerebral infarction affecting left non-dominant side  -PT/OT -PM&R consult   RESP No active issues -Monitor clinically  CV Essential hypertension Hyperlipidemia  -Goal blood pressure is above -On Repatha -continue as outpatient. -TTE  HEME No active issues Monitor clinically Check labs again in the morning  ENDO Check A1c -goal HgbA1c < 7  GI/GU No active issues -Check BMP again in the morning.  Fluid/Electrolyte Disorders Gentle hydration Heart healthy carb modified diet if passes swallow screen at bedside  ID  No active issues -Trend vitals for any evidence of fever and trend blood counts  prophylaxis DVT: SCDs GI: PPI Bowel: Docusate senna  Diet: NPO until cleared by bedside swallow eval or formal swallow screen  Code Status: Full Code    THE FOLLOWING WERE PRESENT ON ADMISSION: Acute ischemic stroke, essential  hypertension  ______________________________________________________________________   Bonney Eligio Lav, MD [facility]  Risks, benefits and alternatives of IVT discussed with patient  [facility] personally reviewed prior to TNK administration   CRITICAL CARE ATTESTATION Performed by: Eligio Lav, MD Total critical care time: 30 minutes Critical care time was exclusive of separately billable procedures and treating other patients and/or supervising APPs/Residents/Students Critical care was necessary to treat or prevent imminent or life-threatening deterioration. This patient is critically ill and at significant risk for neurological worsening and/or death and care requires constant monitoring. Critical care was time spent personally by me on the following activities: development of treatment plan with patient and/or surrogate as well as nursing, discussions with consultants, evaluation of patient's response to treatment, examination of patient, obtaining history from patient or surrogate, ordering and performing treatments and interventions, ordering and review of laboratory studies, ordering and review of radiographic studies, pulse oximetry, re-evaluation of patient's condition, participation in multidisciplinary rounds and medical decision making of high complexity in the care of this patient.

## 2023-10-03 NOTE — Code Documentation (Addendum)
 Stroke Response Nurse Documentation Code Documentation  Haley Beasley is a 68 y.o. female arriving to [facility]  via [facility] on 10/03/2023 with past medical hx of breast cancer, HLD, HTN, OSA. On No antithrombotic. Code stroke was activated by EMS.   Patient from home where she was LKW at 0900 and now complaining of left sided weakness, left facial droop. She woke up in her normal state of health this morning. She was enjoying a cup of coffee when she had sudden onset of left sided weakness. Pt complaining of 5/10 headache on arrival.   Stroke team at the bedside on patient arrival. Labs drawn and patient cleared for CT by Dr. Emil. Patient to CT with team.   NIHSS 6, see documentation for details and code stroke times. Patient with left arm weakness, left leg weakness, left limb ataxia, and left decreased sensation on exam.   The following imaging was completed:  CT Head and CTA.   Patient is a candidate for IV Thrombolytic. Patient is not a candidate for IR due to imaging and assessment negative for LVO.   Care Plan:   Q15 x 2 hours, q30 x 6 hours, q1 x 16 hours until 24 hour mark  BP < 180/105  Call Neuro for:  New Headache, worsening symptoms, bleeding, nausea/vomiting, or any signs of angioedema.   Bedside handoff with ED RN Clotilda.    Kelsey Durflinger L Yoshika Vensel  Rapid Response RN

## 2023-10-04 ENCOUNTER — Inpatient Hospital Stay (HOSPITAL_COMMUNITY): Payer: Medicare HMO

## 2023-10-04 DIAGNOSIS — I639 Cerebral infarction, unspecified: Secondary | ICD-10-CM

## 2023-10-04 LAB — HEMOGLOBIN A1C
Hgb A1c MFr Bld: 5.9 % — ABNORMAL HIGH (ref 4.8–5.6)
Mean Plasma Glucose: 122.63 mg/dL

## 2023-10-04 LAB — LIPID PANEL
Cholesterol: 220 mg/dL — ABNORMAL HIGH (ref 0–200)
HDL: 57 mg/dL (ref >40)
LDL Cholesterol: 139 mg/dL — ABNORMAL HIGH (ref 0–99)
Total CHOL/HDL Ratio: 3.9 {ratio}
Triglycerides: 119 mg/dL (ref <150)
VLDL: 24 mg/dL (ref 0–40)

## 2023-10-04 MED ORDER — ATORVASTATIN CALCIUM 40 MG PO TABS: 40.0000 mg | ORAL_TABLET | Freq: Every day | ORAL | Status: DC

## 2023-10-04 MED ORDER — SERTRALINE HCL 50 MG PO TABS
25.0000 mg | ORAL_TABLET | Freq: Every day | ORAL | Status: DC
  Administered 2023-10-04 – 2023-10-05 (×2): 25 mg via ORAL
  Filled 2023-10-04 (×2): qty 1

## 2023-10-04 MED ORDER — CLOPIDOGREL BISULFATE 75 MG PO TABS
75.0000 mg | ORAL_TABLET | Freq: Every day | ORAL | Status: DC
  Administered 2023-10-04 – 2023-10-05 (×2): 75 mg via ORAL
  Filled 2023-10-04 (×2): qty 1

## 2023-10-04 MED ORDER — LOSARTAN POTASSIUM 50 MG PO TABS: 25.0000 mg | ORAL_TABLET | Freq: Every day | ORAL | Status: DC

## 2023-10-04 MED ORDER — ENOXAPARIN SODIUM 40 MG/0.4ML IJ SOSY
40.0000 mg | PREFILLED_SYRINGE | Freq: Every day | INTRAMUSCULAR | Status: DC
  Administered 2023-10-04 – 2023-10-05 (×2): 40 mg via SUBCUTANEOUS
  Filled 2023-10-04 (×2): qty 0.4

## 2023-10-04 MED ORDER — ASPIRIN 81 MG PO TBEC
81.0000 mg | DELAYED_RELEASE_TABLET | Freq: Every day | ORAL | Status: DC
  Administered 2023-10-04 – 2023-10-05 (×2): 81 mg via ORAL
  Filled 2023-10-04 (×2): qty 1

## 2023-10-04 MED ORDER — LEVOTHYROXINE SODIUM 75 MCG PO TABS
75.0000 ug | ORAL_TABLET | Freq: Every day | ORAL | Status: DC
  Administered 2023-10-04 – 2023-10-05 (×2): 75 ug via ORAL
  Filled 2023-10-04 (×2): qty 1

## 2023-10-04 MED ORDER — FLUTICASONE PROPIONATE 50 MCG/ACT NA SUSP
1.0000 | Freq: Every day | NASAL | Status: DC
  Administered 2023-10-04 – 2023-10-05 (×2): 1 via NASAL
  Filled 2023-10-04: qty 16

## 2023-10-04 NOTE — Progress Notes (Addendum)
 STROKE TEAM PROGRESS NOTE   BRIEF HPI [name] is a 68 y.o. female with history of Anxiety, Breast cancer (HCC), Depression, GERD (gastroesophageal reflux disease), Hyperlipidemia, Hypertension, Hypothyroidism, Memory loss, and OSA (obstructive sleep apnea) presenting with left sided weakness. Received TNK in the ED.   NIH on Admission 6  SIGNIFICANT HOSPITAL EVENTS 1/10- TNK   INTERIM HISTORY/SUBJECTIVE MRI today, PT/OT pending Still has an odd sensation in the left side, Strength is improving but still has some weakness on the left lower extremity.  Hemodynamically stable, no BP issues. Resume home medications, continue repatha  after discharge.  OBJECTIVE  CBC    Component Value Date/Time   WBC 7.2 10/03/2023 1016   RBC 4.25 10/03/2023 1016   HGB 13.6 10/03/2023 1018   HGB 12.8 05/08/2023 1005   HCT 40.0 10/03/2023 1018   HCT 39.9 05/08/2023 1005   PLT 335 10/03/2023 1016   PLT 346 05/08/2023 1005   MCV 90.6 10/03/2023 1016   MCV 91 05/08/2023 1005   MCH 30.6 10/03/2023 1016   MCHC 33.8 10/03/2023 1016   RDW 13.5 10/03/2023 1016   RDW 13.0 05/08/2023 1005   LYMPHSABS 2.3 10/03/2023 1016   LYMPHSABS 1.8 05/08/2023 1005   MONOABS 0.5 10/03/2023 1016   EOSABS 0.1 10/03/2023 1016   EOSABS 0.1 05/08/2023 1005   BASOSABS 0.1 10/03/2023 1016   BASOSABS 0.0 05/08/2023 1005    BMET    Component Value Date/Time   NA 137 10/03/2023 1018   NA 139 05/08/2023 1005   K 3.6 10/03/2023 1018   CL 105 10/03/2023 1018   CO2 22 10/03/2023 1016   GLUCOSE 108 (H) 10/03/2023 1018   BUN 15 10/03/2023 1018   BUN 13 05/08/2023 1005   CREATININE 0.70 10/03/2023 1018   CALCIUM  9.3 10/03/2023 1016   EGFR 65 05/08/2023 1005   GFRNONAA >60 10/03/2023 1016    IMAGING past 24 hours ECHOCARDIOGRAM COMPLETE Result Date: 10/03/2023    ECHOCARDIOGRAM REPORT   Patient Name:   Haley Beasley Date of Exam: 10/03/2023 Medical Rec #:  [id number]       Height:       68.0 in Accession #:     [id number]      Weight:       189.6 lb Date of Birth:  [birth year]       BSA:          1.998 m Patient Age:    67 years        BP:           128/75 mmHg Patient Gender: F               HR:           63 bpm. Exam Location:  Inpatient Procedure: 2D Echo, Color Doppler and Cardiac Doppler Indications:    Stroke  History:        Patient has no prior history of Echocardiogram examinations.                 Risk Factors:Hypertension. Breast Cancer.  Sonographer:    Amy Chionchio Referring Phys: [id number] ASHISH ARORA IMPRESSIONS  1. No apical views.  2. Left ventricular ejection fraction, by estimation, is 60 to 65%. The left ventricle has normal function. Left ventricular diastolic function could not be evaluated.  3. Right ventricular systolic function is normal. The right ventricular size is normal. There is normal pulmonary artery systolic pressure.  4. The mitral valve is normal  in structure. Trivial mitral valve regurgitation.  5. The aortic valve is tricuspid. Aortic valve regurgitation is not visualized. FINDINGS  Left Ventricle: Left ventricular ejection fraction, by estimation, is 60 to 65%. The left ventricle has normal function. The left ventricular internal cavity size was normal in size. There is no left ventricular hypertrophy. Left ventricular diastolic function could not be evaluated. Right Ventricle: The right ventricular size is normal. Right ventricular systolic function is normal. There is normal pulmonary artery systolic pressure. The tricuspid regurgitant velocity is 1.92 m/s, and with an assumed right atrial pressure of 3 mmHg,  the estimated right ventricular systolic pressure is 17.7 mmHg. Left Atrium: Left atrial size was normal in size. Right Atrium: Right atrial size was normal in size. Pericardium: There is no evidence of pericardial effusion. Mitral Valve: The mitral valve is normal in structure. Trivial mitral valve regurgitation. Tricuspid Valve: The tricuspid valve is normal in structure.  Tricuspid valve regurgitation is mild. Aortic Valve: The aortic valve is tricuspid. Aortic valve regurgitation is not visualized. Pulmonic Valve: The pulmonic valve was normal in structure. Pulmonic valve regurgitation is not visualized. Aorta: The aortic root and ascending aorta are structurally normal, with no evidence of dilitation. IAS/Shunts: No atrial level shunt detected by color flow Doppler.  LEFT VENTRICLE PLAX 2D LVIDd:         3.90 cm LVIDs:         2.50 cm LV PW:         0.80 cm LV IVS:        1.00 cm LVOT diam:     1.90 cm LVOT Area:     2.84 cm  IVC IVC diam: 1.90 cm                       PULMONIC VALVE AORTA                 PV Vmax:       0.88 m/s Ao Root diam: 3.40 cm PV Peak grad:  3.1 mmHg Ao Asc diam:  3.40 cm  TRICUSPID VALVE TR Peak grad:   14.7 mmHg TR Vmax:        192.00 cm/s  SHUNTS Systemic Diam: 1.90 cm Vina Gull MD Electronically signed by Vina Gull MD Signature Date/Time: 10/03/2023/3:59:23 PM    Final    CT ANGIO HEAD NECK W WO CM (CODE STROKE) Result Date: 10/03/2023 CLINICAL DATA:  Neuro deficit, acute, stroke suspected. EXAM: CT ANGIOGRAPHY HEAD AND NECK TECHNIQUE: Multidetector CT imaging of the head and neck was performed using the standard protocol during bolus administration of intravenous contrast. Multiplanar CT image reconstructions and MIPs were obtained to evaluate the vascular anatomy. Carotid stenosis measurements (when applicable) are obtained utilizing NASCET criteria, using the distal internal carotid diameter as the denominator. RADIATION DOSE REDUCTION: This exam was performed according to the departmental dose-optimization program which includes automated exposure control, adjustment of the mA and/or kV according to patient size and/or use of iterative reconstruction technique. CONTRAST:  75mL OMNIPAQUE  IOHEXOL  350 MG/ML SOLN COMPARISON:  CT head without contrast 10/03/2023. FINDINGS: CTA NECK FINDINGS Aortic arch: Atherosclerotic calcifications are present at  the aortic arch including the origin of the left common carotid artery and the left subclavian artery. No focal stenosis or aneurysm is present. No dissection present. Right carotid system: The right common carotid artery is within normal limits. Atherosclerotic calcifications are present at the right carotid bifurcation without significant stenosis. The cervical right ICA  is otherwise normal. Left carotid system: Mural calcifications are present in the distal left common carotid artery. Atherosclerotic calcifications are present bifurcation without significant stenosis. Cervical left ICA is normal. Vertebral arteries: The is vertebral arteries are codominant. Both vertebral arteries originate from the subclavian arteries without significant stenosis. Next no significant stenosis is present in either vertebral artery in the neck. Skeleton: Vertebral body heights and alignment are normal. No focal osseous lesions are present. Other neck: Soft tissues the neck are otherwise unremarkable. Salivary glands are within normal limits. Metallic densities are present within the thyroid  bed. Minimal residual thyroid  tissue is present on the right. This may be related to prior treatment. No significant adenopathy is present. No focal mucosal or submucosal lesions are present. Upper chest: Centrilobular emphysema is present. No focal nodule or mass lesion is present. No edema or effusion is present. Review of the MIP images confirms the above findings CTA HEAD FINDINGS Anterior circulation: The internal carotid arteries are within normal limits from the skull base to the ICA termini. The A1 and M1 segments are normal. The ACA and MCA branch vessels are within normal limits bilaterally. No aneurysm is present. Posterior circulation: PICA origins are visualized and normal. Vertebrobasilar junction and basilar artery are normal. The superior cerebellar arteries are patent. Both posterior cerebral arteries originate from basilar  tip. PCA branch vessels are normal bilaterally. No aneurysm is present. Venous sinuses: The dural sinuses are patent. The straight sinus and deep cerebral veins are intact. Cortical veins are within normal limits. No significant vascular malformation is evident. Anatomic variants: None Review of the MIP images confirms the above findings IMPRESSION: 1. No significant proximal stenosis, aneurysm, or branch vessel occlusion within the Circle of Willis. 2. Atherosclerotic changes at the carotid bifurcations bilaterally without significant stenosis. 3. Metallic densities within the thyroid  bed. Minimal residual thyroid  tissue is present on the right. This may be related to prior treatment. 4. Aortic Atherosclerosis (ICD10-I70.0) and Emphysema (ICD10-J43.9). This Electronically Signed   By: Lonni Necessary M.D.   On: 10/03/2023 10:42   CT HEAD CODE STROKE WO CONTRAST Result Date: 10/03/2023 CLINICAL DATA:  Code stroke.  68 year old female. EXAM: CT HEAD WITHOUT CONTRAST TECHNIQUE: Contiguous axial images were obtained from the base of the skull through the vertex without intravenous contrast. RADIATION DOSE REDUCTION: This exam was performed according to the departmental dose-optimization program which includes automated exposure control, adjustment of the mA and/or kV according to patient size and/or use of iterative reconstruction technique. COMPARISON:  None Available. FINDINGS: Brain: Cerebral volume is within normal limits for age. No midline shift, mass effect, or evidence of intracranial mass lesion. No acute intracranial hemorrhage identified. Gray-white matter differentiation is within normal limits for age throughout the brain. No cortically based acute or chronic infarct identified. Vascular: No suspicious intracranial vascular hyperdensity. Mild Calcified atherosclerosis at the skull base. Skull: Intact.  No acute osseous abnormality identified. Sinuses/Orbits: Visualized paranasal sinuses and  mastoids are stable and well aerated. Other: No gaze deviation. Visualized scalp soft tissues are within normal limits. ASPECTS [facility] Stroke Program Early CT Score) Total score (0-10 with 10 being normal): 10 IMPRESSION: Normal for age non contrast CT appearance of the brain.  ASPECTS 10. These results were communicated to Dr. Arora at 10:24 am on 10/03/2023 by text page via the [facility] messaging system. Electronically Signed   By: VEAR Hurst M.D.   On: 10/03/2023 10:24    Vitals:   10/04/23 0500 10/04/23 0600 10/04/23 0700 10/04/23 0800  BP: 124/75 115/67 126/69   Pulse: (!) 56 (!) 57 (!) 57   Resp: (!) 21 14 11    Temp:    98.5 F (36.9 C)  TempSrc:    Oral  SpO2: 92% 95% 95%   Weight:      Height:         PHYSICAL EXAM General:  Alert, well-nourished, well-developed patient in no acute distress Psych:  Mood and affect appropriate for situation CV: Regular rate and rhythm on monitor Respiratory:  Regular, unlabored respirations on room air GI: Abdomen soft and nontender   NEURO:  Mental Status: AA&Ox3, patient is able to give clear and coherent history Speech/Language: speech is without dysarthria or aphasia.  Naming, repetition, fluency, and comprehension intact.  Cranial Nerves:  II: PERRL. Visual fields full.  III, IV, VI: EOMI. Eyelids elevate symmetrically.  V: Sensation is intact to light touch and symmetrical to face.  VII: Face slight facial asymmetry  VIII: hearing intact to voice. IX, X: Palate elevates symmetrically. Phonation is normal.  KP:Dynloizm shrug 5/5. XII: tongue is midline without fasciculations. Motor: RUE and RLE 5/5 LUE and  LLE 4/5 with mild drift  Tone: is normal and bulk is normal Sensation- Intact to light touch bilaterally. Extinction absent to light touch to DSS.  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 ASPECTS 10.     CTA head & neck No LVO MRI  Punctate infarct in the anterior aspect of the right thalamus (slide 77 of axial DWI, 56 of coronal DWI) 2D Echo 60-65% LDL 139 HgbA1c 5.9 VTE prophylaxis - lovenox  No antithrombotic prior to admission, now on aspirin  81 mg daily and clopidogrel  75 mg daily for 3 weeks and then aspirin  alone. Therapy recommendations:  Pending Disposition:  pending evaluation by PT/OT  Hypertension Home meds:  Cozaar  Stable Blood Pressure Goal: BP less than 180/105   Hyperlipidemia Home meds:  Repatha  LDL 139, goal < 70 Continue repatha  Continue statin at discharge   Hospital day # 1   Patient seen and examined by NP/APP with MD. MD to update note as needed.   Jorene Last, DNP, FNP-BC [facility] Pager: [phone]   STROKE MD NOTE :  I have personally obtained history,examined this patient, reviewed notes, independently viewed imaging studies, participated in medical decision making and plan of care.ROS completed by me personally and pertinent positives fully documented  I have made any additions or clarifications directly to the above note. Agree with note above.  She presented with sudden onset of left hemiparesis and sensory loss due to small right subcortical infarct and received IV TNK and has made near complete recovery.  Continue close neurological observation and strict blood pressure control as per post TNK protocol.  Mobilize out of bed.  Therapy consults.  Resume home medications if able to swallow safely.  Check MRI scan later this morning if no bleed aspirin  and Plavix  for 3 weeks and then aspirin  alone. Patient may also consider possible participation in the [facility] stroke prevention study(standard of  care antiplatelet therapy with or without Asundexian-factor XI inhibitor) .  She was given information to review and decide.   She  and her husband were clearly informed that study participation is voluntary and she can withdraw participation at any point in the future if she chooses.  She will get the same excellent medical care with history of whether she participates in the study or not. This patient is critically ill and at significant risk of neurological worsening, death and care requires constant monitoring of vital signs, hemodynamics,respiratory and cardiac monitoring, extensive review of multiple databases, frequent neurological assessment, discussion with family, other specialists and medical decision making of high complexity.I have made any additions or clarifications directly to the above note.This critical care time does not reflect procedure time, or teaching time or supervisory time of PA/NP/Med Resident etc but could involve care discussion time.  I spent 30 minutes of neurocritical care time  in the care of  this patient.     Eather Popp, MD Medical Director [facility] Stroke Center Pager: [phone] 10/04/2023 11:51 AM  To contact Stroke Continuity provider, please refer to [email or web address]. After hours, contact General Neurology

## 2023-10-04 NOTE — Evaluation (Signed)
 Physical Therapy Evaluation Patient Details Name: Haley Beasley MRN: [id number] DOB: 1956-05-02 Today's Date: 10/04/2023  History of Present Illness  Pt is a 68 y.o. female who presented to the ED 1/11 with L side weakness. She received TNK. MRI 1/12 revealed punctate infarct in the anterior R thalamus. PMH:  anxiety, breast cancer, depression, GERD, hyperlipidemia, HTN, hypothyroidism, memory loss, OSA. s/p R ankle ORIF May 2024   Clinical Impression  Pt admitted with above diagnosis. PTA pt lived at home with her husband, independent, active, and driving. Pt currently with functional limitations due to the deficits listed below (see PT Problem List). On eval, she required CGA bed mobility, min assist sit to stand, and CGA amb 175' with RW.  Guarded gait pattern. No LOB noted. No knee buckling noted. Pt will benefit from acute skilled PT to increase their independence and safety with mobility to allow discharge.  Upon d/c, pt would benefit from OPPT. She has RW for home.         If plan is discharge home, recommend the following: A little help with walking and/or transfers;A little help with bathing/dressing/bathroom;Help with stairs or ramp for entrance;Assist for transportation;Assistance with cooking/housework   Can travel by private vehicle        Equipment Recommendations None recommended by PT  Recommendations for Other Services       Functional Status Assessment Patient has had a recent decline in their functional status and demonstrates the ability to make significant improvements in function in a reasonable and predictable amount of time.     Precautions / Restrictions Precautions Precautions: Fall      Mobility  Bed Mobility Overal bed mobility: Needs Assistance Bed Mobility: Supine to Sit     Supine to sit: HOB elevated, Used rails, Contact guard          Transfers Overall transfer level: Needs assistance Equipment used: Rolling walker (2  wheels) Transfers: Sit to/from Stand Sit to Stand: Min assist           General transfer comment: increased time    Ambulation/Gait Ambulation/Gait assistance: Contact guard assist Gait Distance (Feet): 175 Feet Assistive device: Rolling walker (2 wheels) Gait Pattern/deviations: Step-through pattern, Decreased stride length, Decreased weight shift to left Gait velocity: decreased Gait velocity interpretation: <1.31 ft/sec, indicative of household ambulator   General Gait Details: Steady gait with RW. Pt repeatedly stating my L leg just doesn't feel right. No knee buckling noted. Good foot clearance bilat.  Stairs            Wheelchair Mobility     Tilt Bed    Modified Rankin (Stroke Patients Only) Modified Rankin (Stroke Patients Only) Pre-Morbid Rankin Score: No symptoms Modified Rankin: Moderately severe disability     Balance Overall balance assessment: Needs assistance Sitting-balance support: No upper extremity supported, Feet supported Sitting balance-Leahy Scale: Good     Standing balance support: Bilateral upper extremity supported, Single extremity supported, Reliant on assistive device for balance, During functional activity Standing balance-Leahy Scale: Poor                               Pertinent Vitals/Pain Pain Assessment Pain Assessment: No/denies pain    Home Living Family/patient expects to be discharged to:: Private residence Living Arrangements: Spouse/significant other Available Help at Discharge: Family;Available 24 hours/day Type of Home: House Home Access: Stairs to enter Entrance Stairs-Rails: None Entrance Stairs-Number of Steps: 2 (from garage)  Home Layout: One level Home Equipment: Pharmacist, Hospital (2 wheels)      Prior Function Prior Level of Function : Independent/Modified Independent;Driving;History of Falls (last six months)             Mobility Comments: amb without AD        Extremity/Trunk Assessment   Upper Extremity Assessment Upper Extremity Assessment: Defer to OT evaluation    Lower Extremity Assessment Lower Extremity Assessment: RLE deficits/detail;LLE deficits/detail RLE Deficits / Details: 5/5 LLE Deficits / Details: 4-/5, sensation intact    Cervical / Trunk Assessment Cervical / Trunk Assessment: Normal  Communication   Communication Communication: No apparent difficulties  Cognition Arousal: Alert Behavior During Therapy: WFL for tasks assessed/performed Overall Cognitive Status: Within Functional Limits for tasks assessed                                          General Comments General comments (skin integrity, edema, etc.): VSS on RA    Exercises     Assessment/Plan    PT Assessment Patient needs continued PT services  PT Problem List Decreased strength;Decreased balance;Decreased mobility;Decreased activity tolerance       PT Treatment Interventions Functional mobility training;Balance training;DME instruction;Patient/family education;Gait training;Therapeutic activities;Stair training    PT Goals (Current goals can be found in the Care Plan section)  Acute Rehab PT Goals Patient Stated Goal: home PT Goal Formulation: With patient/family Time For Goal Achievement: 10/18/23 Potential to Achieve Goals: Good    Frequency Min 1X/week     Co-evaluation               AM-PAC PT 6 Clicks Mobility  Outcome Measure Help needed turning from your back to your side while in a flat bed without using bedrails?: A Little Help needed moving from lying on your back to sitting on the side of a flat bed without using bedrails?: A Little Help needed moving to and from a bed to a chair (including a wheelchair)?: A Little Help needed standing up from a chair using your arms (e.g., wheelchair or bedside chair)?: A Little Help needed to walk in hospital room?: A Little Help needed climbing 3-5 steps with a  railing? : A Lot 6 Click Score: 17    End of Session Equipment Utilized During Treatment: Gait belt Activity Tolerance: Patient tolerated treatment well Patient left: in chair;with call bell/phone within reach;with family/visitor present Nurse Communication: Mobility status PT Visit Diagnosis: Other abnormalities of gait and mobility (R26.89)    Time: 8590-8562 PT Time Calculation (min) (ACUTE ONLY): 28 min   Charges:   PT Evaluation $PT Eval Moderate Complexity: 1 Mod PT Treatments $Gait Training: 8-22 mins PT General Charges $$ ACUTE PT VISIT: 1 Visit         Sari MATSU., PT  Office # [phone]   Erven Sari Shaker 10/04/2023, 4:09 PM

## 2023-10-04 NOTE — Plan of Care (Signed)
  Problem: Education: Goal: Knowledge of disease or condition will improve Outcome: Progressing Goal: Knowledge of secondary prevention will improve (MUST DOCUMENT ALL) Outcome: Progressing   Problem: Coping: Goal: Will identify appropriate support needs Outcome: Progressing   Problem: Self-Care: Goal: Ability to participate in self-care as condition permits will improve Outcome: Progressing Goal: Ability to communicate needs accurately will improve Outcome: Progressing   Problem: Clinical Measurements: Goal: Ability to maintain clinical measurements within normal limits will improve Outcome: Progressing   Problem: Activity: Goal: Risk for activity intolerance will decrease Outcome: Progressing

## 2023-10-04 NOTE — Plan of Care (Signed)
  Problem: Education: Goal: Knowledge of disease or condition will improve Outcome: Progressing Goal: Knowledge of secondary prevention will improve (MUST DOCUMENT ALL) Outcome: Progressing Goal: Knowledge of patient specific risk factors will improve Alonso N/A or DELETE if not current risk factor) Outcome: Progressing   Problem: Ischemic Stroke/TIA Tissue Perfusion: Goal: Complications of ischemic stroke/TIA will be minimized Outcome: Progressing   Problem: Coping: Goal: Will verbalize positive feelings about self Outcome: Progressing   Problem: Self-Care: Goal: Ability to communicate needs accurately will improve Outcome: Progressing

## 2023-10-05 DIAGNOSIS — I639 Cerebral infarction, unspecified: Secondary | ICD-10-CM | POA: Diagnosis not present

## 2023-10-05 MED ORDER — CLOPIDOGREL BISULFATE 75 MG PO TABS: 75.0000 mg | ORAL_TABLET | Freq: Every day | ORAL | 0 refills | Status: DC

## 2023-10-05 MED ORDER — ASPIRIN 81 MG PO TBEC: 81.0000 mg | DELAYED_RELEASE_TABLET | Freq: Every day | ORAL | 12 refills | Status: AC

## 2023-10-05 NOTE — Plan of Care (Signed)
  Problem: Education: Goal: Knowledge of disease or condition will improve Outcome: Progressing   Problem: Ischemic Stroke/TIA Tissue Perfusion: Goal: Complications of ischemic stroke/TIA will be minimized Outcome: Progressing   Problem: Health Behavior/Discharge Planning: Goal: Ability to manage health-related needs will improve Outcome: Progressing   Problem: Activity: Goal: Risk for activity intolerance will decrease Outcome: Progressing

## 2023-10-05 NOTE — Progress Notes (Signed)
(  Late entry note)  Before pt discharged, stroke education given to pt and husband. We discussed "BEFAST" and 911 activation, as well as her individual risk factors (hyperlipidemia and HTN). Written materials given.

## 2023-10-05 NOTE — Discharge Summary (Addendum)
 Stroke Discharge Summary  Patient ID: Haley Beasley   MRN: [id number]      DOB: 05/18/1956  Date of Admission: 10/03/2023 Date of Discharge: 10/05/2023  Attending Physician:  Stroke, Md, MD Consultant(s):    None  Patient's PCP:  Fleeta Valeria Mayo, MD  DISCHARGE PRIMARY DIAGNOSIS:  Acute ischemic infarct:   Punctate infarct in the anterior right thalamus s/p TNK Etiology: Small vessel disease in the setting of uncontrolled risk factors  Left hemiparesis with left-sided sensory loss  Patient Active Problem List   Diagnosis Date Noted   Acute ischemic stroke (HCC) 10/03/2023   Statin intolerance 08/17/2023   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   Prediabetes 08/17/2023   Rash 06/15/2023   Breast cancer in female [facility]) 06/06/2023   Hypercholesterolemia 05/08/2023   Age-related osteoporosis with current pathological fracture 05/08/2023   DDD (degenerative disc disease), lumbar 05/08/2023   OSA (obstructive sleep apnea) 05/08/2023   Seasonal allergies 05/08/2023   Chronic venous insufficiency 05/08/2023   Moderate episode of recurrent major depressive disorder (HCC) 05/08/2023   GAD (generalized anxiety disorder) 05/08/2023   Closed fracture of right ankle 05/08/2023   BMI 29.0-29.9,adult 05/08/2023   History of breast cancer 05/08/2023   Hypothyroidism (acquired) 01/07/2023   Essential hypertension 01/07/2023   Age-related osteoporosis without current pathological fracture 10/06/2022   Hip pain 10/06/2022   COVID-19 08/20/2022   Strep throat 08/20/2022   Hyperlipidemia 06/25/2022   Genetic testing 02/04/2022     Secondary Diagnoses: Hypertension Hyperlipidemia   Allergies as of 10/05/2023       Reactions   Fluogen [influenza Virus Vaccine]    Penicillins         Medication List     STOP taking these medications    triamcinolone  ointment 0.5 % Commonly known as: KENALOG        TAKE these medications    ALPRAZolam  0.25 MG tablet Commonly known as:  XANAX  Take 1 tablet (0.25 mg total) by mouth daily as needed.   aspirin  EC 81 MG tablet Take 1 tablet (81 mg total) by mouth daily. Swallow whole. Start taking on: October 06, 2023 What changed:  how much to take when to take this reasons to take this additional instructions   clopidogrel  75 MG tablet Commonly known as: PLAVIX  Take 1 tablet (75 mg total) by mouth daily. Start taking on: October 06, 2023   fluticasone  50 MCG/ACT nasal spray Commonly known as: FLONASE  Place 1 spray into both nostrils daily.   losartan  25 MG tablet Commonly known as: COZAAR  Take 1 tablet (25 mg total) by mouth daily.   pantoprazole  40 MG tablet Commonly known as: PROTONIX  Take 1 tablet (40 mg total) by mouth daily.   Repatha  SureClick 140 MG/ML Soaj Generic drug: Evolocumab  Inject 140 mg as directed every 14 (fourteen) days. What changed: additional instructions   sertraline  25 MG tablet Commonly known as: ZOLOFT  Take 1 tablet (25 mg total) by mouth daily.   Synthroid  75 MCG tablet Generic drug: levothyroxine  TAKE 1 TABLET(75 MCG) BY MOUTH DAILY What changed: See the new instructions.   zaleplon 10 MG capsule Commonly known as: SONATA Take 10 mg by mouth at bedtime.        LABORATORY STUDIES CBC    Component Value Date/Time   WBC 7.2 10/03/2023 1016   RBC 4.25 10/03/2023 1016   HGB 13.6 10/03/2023 1018   HGB 12.8 05/08/2023 1005   HCT 40.0 10/03/2023 1018   HCT 39.9 05/08/2023 1005  PLT 335 10/03/2023 1016   PLT 346 05/08/2023 1005   MCV 90.6 10/03/2023 1016   MCV 91 05/08/2023 1005   MCH 30.6 10/03/2023 1016   MCHC 33.8 10/03/2023 1016   RDW 13.5 10/03/2023 1016   RDW 13.0 05/08/2023 1005   LYMPHSABS 2.3 10/03/2023 1016   LYMPHSABS 1.8 05/08/2023 1005   MONOABS 0.5 10/03/2023 1016   EOSABS 0.1 10/03/2023 1016   EOSABS 0.1 05/08/2023 1005   BASOSABS 0.1 10/03/2023 1016   BASOSABS 0.0 05/08/2023 1005   CMP    Component Value Date/Time   NA 137 10/03/2023  1018   NA 139 05/08/2023 1005   K 3.6 10/03/2023 1018   CL 105 10/03/2023 1018   CO2 22 10/03/2023 1016   GLUCOSE 108 (H) 10/03/2023 1018   BUN 15 10/03/2023 1018   BUN 13 05/08/2023 1005   CREATININE 0.70 10/03/2023 1018   CALCIUM  9.3 10/03/2023 1016   PROT 7.5 10/03/2023 1016   PROT 7.3 05/08/2023 1005   ALBUMIN 3.9 10/03/2023 1016   ALBUMIN 4.5 05/08/2023 1005   AST 19 10/03/2023 1016   ALT 17 10/03/2023 1016   ALKPHOS 57 10/03/2023 1016   BILITOT 0.6 10/03/2023 1016   BILITOT 0.3 05/08/2023 1005   GFRNONAA >60 10/03/2023 1016   COAGS Lab Results  Component Value Date   INR 0.9 10/03/2023   Lipid Panel    Component Value Date/Time   CHOL 220 (H) 10/04/2023 0410   CHOL 227 (H) 05/08/2023 1005   TRIG 119 10/04/2023 0410   HDL 57 10/04/2023 0410   HDL 76 05/08/2023 1005   CHOLHDL 3.9 10/04/2023 0410   VLDL 24 10/04/2023 0410   LDLCALC 139 (H) 10/04/2023 0410   LDLCALC 134 (H) 05/08/2023 1005   HgbA1C  Lab Results  Component Value Date   HGBA1C 5.9 (H) 10/04/2023   Alcohol Level    Component Value Date/Time   ETH <10 10/03/2023 1016     SIGNIFICANT DIAGNOSTIC STUDIES MR BRAIN WO CONTRAST Addendum Date: 10/04/2023 ADDENDUM REPORT: 10/04/2023 11:19 ADDENDUM: Study discussed by telephone with Dr. SHAUNNA Popp on 10/04/2023 at 11:16. He advises that this patient was treated with TNK, and that clinically her residual deficits are suspicious for thalamic ischemia. He questions and I concur that there is subtle asymmetric diffusion at the ventral right thalamus on series 5 image 77, and also coronal DWI image 58. This is difficult to correlate on ADC and T2/FLAIR, but is suspicious for subtle ischemia in this clinical setting. Electronically Signed   By: VEAR Hurst M.D.   On: 10/04/2023 11:19   Result Date: 10/04/2023 CLINICAL DATA:  68 year old female code stroke presentation yesterday. Left side weakness. EXAM: MRI HEAD WITHOUT CONTRAST TECHNIQUE: Multiplanar, multiecho pulse  sequences of the brain and surrounding structures were obtained without intravenous contrast. COMPARISON:  CT head, CTA head and neck yesterday. FINDINGS: Brain: No restricted diffusion to suggest acute infarction. No midline shift, mass effect, evidence of mass lesion, ventriculomegaly, extra-axial collection or acute intracranial hemorrhage. Cervicomedullary junction and pituitary are within normal limits. Widely scattered but generally small and frequently subcortical cerebral white matter T2 and FLAIR hyperintensity. No cortical encephalomalacia identified. No chronic cerebral blood products identified. Deep gray nuclei, brainstem and cerebellum appear normal for age. Vascular: Major intracranial vascular flow voids are preserved. Skull and upper cervical spine: Negative. Sinuses/Orbits: Postoperative changes to both globes, otherwise negative. Other: Visible internal auditory structures appear normal. Mastoids are clear. Negative visible scalp and face. IMPRESSION: 1. No  acute intracranial abnormality. 2. Moderate for age cerebral white matter signal changes, nonspecific but most commonly due to chronic small vessel disease. Electronically Signed: By: VEAR Hurst M.D. On: 10/04/2023 09:55   ECHOCARDIOGRAM COMPLETE Result Date: 10/03/2023    ECHOCARDIOGRAM REPORT   Patient Name:   ABY GESSEL Date of Exam: 10/03/2023 Medical Rec #:  [id number]       Height:       68.0 in Accession #:    [id number]      Weight:       189.6 lb Date of Birth:  21-Oct-1955       BSA:          1.998 m Patient Age:    67 years        BP:           128/75 mmHg Patient Gender: F               HR:           63 bpm. Exam Location:  Inpatient Procedure: 2D Echo, Color Doppler and Cardiac Doppler Indications:    Stroke  History:        Patient has no prior history of Echocardiogram examinations.                 Risk Factors:Hypertension. Breast Cancer.  Sonographer:    Amy Chionchio Referring Phys: [id number] ASHISH ARORA IMPRESSIONS  1. No  apical views.  2. Left ventricular ejection fraction, by estimation, is 60 to 65%. The left ventricle has normal function. Left ventricular diastolic function could not be evaluated.  3. Right ventricular systolic function is normal. The right ventricular size is normal. There is normal pulmonary artery systolic pressure.  4. The mitral valve is normal in structure. Trivial mitral valve regurgitation.  5. The aortic valve is tricuspid. Aortic valve regurgitation is not visualized. FINDINGS  Left Ventricle: Left ventricular ejection fraction, by estimation, is 60 to 65%. The left ventricle has normal function. The left ventricular internal cavity size was normal in size. There is no left ventricular hypertrophy. Left ventricular diastolic function could not be evaluated. Right Ventricle: The right ventricular size is normal. Right ventricular systolic function is normal. There is normal pulmonary artery systolic pressure. The tricuspid regurgitant velocity is 1.92 m/s, and with an assumed right atrial pressure of 3 mmHg,  the estimated right ventricular systolic pressure is 17.7 mmHg. Left Atrium: Left atrial size was normal in size. Right Atrium: Right atrial size was normal in size. Pericardium: There is no evidence of pericardial effusion. Mitral Valve: The mitral valve is normal in structure. Trivial mitral valve regurgitation. Tricuspid Valve: The tricuspid valve is normal in structure. Tricuspid valve regurgitation is mild. Aortic Valve: The aortic valve is tricuspid. Aortic valve regurgitation is not visualized. Pulmonic Valve: The pulmonic valve was normal in structure. Pulmonic valve regurgitation is not visualized. Aorta: The aortic root and ascending aorta are structurally normal, with no evidence of dilitation. IAS/Shunts: No atrial level shunt detected by color flow Doppler.  LEFT VENTRICLE PLAX 2D LVIDd:         3.90 cm LVIDs:         2.50 cm LV PW:         0.80 cm LV IVS:        1.00 cm LVOT diam:      1.90 cm LVOT Area:     2.84 cm  IVC IVC diam: 1.90 cm  PULMONIC VALVE AORTA                 PV Vmax:       0.88 m/s Ao Root diam: 3.40 cm PV Peak grad:  3.1 mmHg Ao Asc diam:  3.40 cm  TRICUSPID VALVE TR Peak grad:   14.7 mmHg TR Vmax:        192.00 cm/s  SHUNTS Systemic Diam: 1.90 cm Vina Gull MD Electronically signed by Vina Gull MD Signature Date/Time: 10/03/2023/3:59:23 PM    Final    CT ANGIO HEAD NECK W WO CM (CODE STROKE) Result Date: 10/03/2023 CLINICAL DATA:  Neuro deficit, acute, stroke suspected. EXAM: CT ANGIOGRAPHY HEAD AND NECK TECHNIQUE: Multidetector CT imaging of the head and neck was performed using the standard protocol during bolus administration of intravenous contrast. Multiplanar CT image reconstructions and MIPs were obtained to evaluate the vascular anatomy. Carotid stenosis measurements (when applicable) are obtained utilizing NASCET criteria, using the distal internal carotid diameter as the denominator. RADIATION DOSE REDUCTION: This exam was performed according to the departmental dose-optimization program which includes automated exposure control, adjustment of the mA and/or kV according to patient size and/or use of iterative reconstruction technique. CONTRAST:  75mL OMNIPAQUE  IOHEXOL  350 MG/ML SOLN COMPARISON:  CT head without contrast 10/03/2023. FINDINGS: CTA NECK FINDINGS Aortic arch: Atherosclerotic calcifications are present at the aortic arch including the origin of the left common carotid artery and the left subclavian artery. No focal stenosis or aneurysm is present. No dissection present. Right carotid system: The right common carotid artery is within normal limits. Atherosclerotic calcifications are present at the right carotid bifurcation without significant stenosis. The cervical right ICA is otherwise normal. Left carotid system: Mural calcifications are present in the distal left common carotid artery. Atherosclerotic calcifications are present  bifurcation without significant stenosis. Cervical left ICA is normal. Vertebral arteries: The is vertebral arteries are codominant. Both vertebral arteries originate from the subclavian arteries without significant stenosis. Next no significant stenosis is present in either vertebral artery in the neck. Skeleton: Vertebral body heights and alignment are normal. No focal osseous lesions are present. Other neck: Soft tissues the neck are otherwise unremarkable. Salivary glands are within normal limits. Metallic densities are present within the thyroid  bed. Minimal residual thyroid  tissue is present on the right. This may be related to prior treatment. No significant adenopathy is present. No focal mucosal or submucosal lesions are present. Upper chest: Centrilobular emphysema is present. No focal nodule or mass lesion is present. No edema or effusion is present. Review of the MIP images confirms the above findings CTA HEAD FINDINGS Anterior circulation: The internal carotid arteries are within normal limits from the skull base to the ICA termini. The A1 and M1 segments are normal. The ACA and MCA branch vessels are within normal limits bilaterally. No aneurysm is present. Posterior circulation: PICA origins are visualized and normal. Vertebrobasilar junction and basilar artery are normal. The superior cerebellar arteries are patent. Both posterior cerebral arteries originate from basilar tip. PCA branch vessels are normal bilaterally. No aneurysm is present. Venous sinuses: The dural sinuses are patent. The straight sinus and deep cerebral veins are intact. Cortical veins are within normal limits. No significant vascular malformation is evident. Anatomic variants: None Review of the MIP images confirms the above findings IMPRESSION: 1. No significant proximal stenosis, aneurysm, or branch vessel occlusion within the Circle of Willis. 2. Atherosclerotic changes at the carotid bifurcations bilaterally without  significant stenosis. 3. Metallic densities within the  thyroid  bed. Minimal residual thyroid  tissue is present on the right. This may be related to prior treatment. 4. Aortic Atherosclerosis (ICD10-I70.0) and Emphysema (ICD10-J43.9). This Electronically Signed   By: Lonni Necessary M.D.   On: 10/03/2023 10:42   CT HEAD CODE STROKE WO CONTRAST Result Date: 10/03/2023 CLINICAL DATA:  Code stroke.  68 year old female. EXAM: CT HEAD WITHOUT CONTRAST TECHNIQUE: Contiguous axial images were obtained from the base of the skull through the vertex without intravenous contrast. RADIATION DOSE REDUCTION: This exam was performed according to the departmental dose-optimization program which includes automated exposure control, adjustment of the mA and/or kV according to patient size and/or use of iterative reconstruction technique. COMPARISON:  None Available. FINDINGS: Brain: Cerebral volume is within normal limits for age. No midline shift, mass effect, or evidence of intracranial mass lesion. No acute intracranial hemorrhage identified. Gray-white matter differentiation is within normal limits for age throughout the brain. No cortically based acute or chronic infarct identified. Vascular: No suspicious intracranial vascular hyperdensity. Mild Calcified atherosclerosis at the skull base. Skull: Intact.  No acute osseous abnormality identified. Sinuses/Orbits: Visualized paranasal sinuses and mastoids are stable and well aerated. Other: No gaze deviation. Visualized scalp soft tissues are within normal limits. ASPECTS [facility] Stroke Program Early CT Score) Total score (0-10 with 10 being normal): 10 IMPRESSION: Normal for age non contrast CT appearance of the brain.  ASPECTS 10. These results were communicated to Dr. Arora at 10:24 am on 10/03/2023 by text page via the [facility] messaging system. Electronically Signed   By: VEAR Hurst M.D.   On: 10/03/2023 10:24       HISTORY OF PRESENT ILLNESS 68 y.o. patient with history  of breast cancer, anxiety, depression, GERD, hypertension, hyperlipidemia, hypothyroidism and sleep apnea was admitted with acute onset left-sided weakness.  Patient was given IV TNK after careful discussion about risk benefits and alternatives with patient.  She was kept in ICU and blood pressure was tightly controlled.  She showed rapid neurological improvement.  Follow-up brain imaging confirmed tiny punctate right thalamic infarct with no hemorrhage.  Patient was evaluated by therapist and felt safe to be discharged home.  Patient was counseled to be compliant with taking her Repatha  as she had missed the last 2 doses.  HOSPITAL COURSE Patient was given TNK in the ED, and symptoms did improve.  She was found to have a small right thalamic stroke on MRI.  Acute ischemic infarct:   Punctate infarct in the anterior right thalamus s/p TNK Etiology: Small vessel disease in the setting of uncontrolled risk factors  Code Stroke CT head No acute abnormality. ASPECTS 10.     CTA head & neck No LVO MRI  Punctate infarct in the anterior aspect of the right thalamus (slide 77 of axial DWI, 56 of coronal DWI) 2D Echo 60-65% LDL 139 HgbA1c 5.9 VTE prophylaxis - lovenox  No antithrombotic prior to admission, now on aspirin  81 mg daily and clopidogrel  75 mg daily for 3 weeks and then aspirin  alone. Therapy recommendations:  Pending Disposition:  pending evaluation by PT/OT   Hypertension Home meds:  Cozaar  Stable Blood Pressure Goal: BP less than 180/105    Hyperlipidemia Home meds:  Repatha  (patient had missed several doses) LDL 139, goal < 70 Continue repatha  Continue statin at discharge    RN Pressure Injury Documentation:     DISCHARGE EXAM  PHYSICAL EXAM General:  Alert, well-nourished, well-developed patient in no acute distress Psych:  Mood and affect appropriate for situation CV: Regular  rate and rhythm on monitor Respiratory:  Regular, unlabored respirations on room air GI:  Abdomen soft and nontender  NEURO:  Mental Status: AA&Ox3  Speech/Language: speech is without dysarthria or aphasia.    Cranial Nerves:  II: PERRL. Visual fields full.  III, IV, VI: EOMI. Eyelids elevate symmetrically.  V: Sensation is intact to light touch and symmetrical to face.  VII: Smile is symmetrical.  VIII: hearing intact to voice. IX, X:  Phonation is normal.  XII: tongue is midline without fasciculations. Motor: 5/5 strength to all muscle groups tested.  Some drift in the left leg Tone: is normal and bulk is normal Sensation- Intact to light touch bilaterally.  Coordination: FTN intact bilaterally Gait- deferred  1a Level of Conscious.: 0 1b LOC Questions: 0 1c LOC Commands: 0 2 Best Gaze: 0 3 Visual: 0 4 Facial Palsy: 0 5a Motor Arm - left: 0 5b Motor Arm - Right: 0 6a Motor Leg - Left: 1 6b Motor Leg - Right: 0 7 Limb Ataxia: 0 8 Sensory: 0 9 Best Language: 0 10 Dysarthria: 0 11 Extinct. and Inatten.: 0 TOTAL: 1   Discharge Diet       Diet   Diet Heart Room service appropriate? Yes; Fluid consistency: Thin   liquids  DISCHARGE PLAN Disposition: Home with outpatient PT aspirin  81 mg daily and clopidogrel  75 mg daily for secondary stroke prevention for 3 weeks then aspirin  81 mg daily alone. Ongoing stroke risk factor control by Primary Care Physician at time of discharge Follow-up PCP Fleeta Valeria Mayo, MD in 2 weeks. Follow-up in [facility] in 8 weeks, office to schedule an appointment.   32 minutes were spent preparing discharge.  Cortney E Everitt Clint Kill , MSN, AGACNP-BC [facility] See Amion for schedule and pager information 10/05/2023 1:19 PM  I have personally obtained history,examined this patient, reviewed notes, independently viewed imaging studies, participated in medical decision making and plan of care.ROS completed by me personally and pertinent positives fully documented  I have made any  additions or clarifications directly to the above note. Agree with note above.    Eather Popp, MD Medical Director [facility] Pager: [phone] 10/05/2023 5:12 PM

## 2023-10-05 NOTE — Discharge Instructions (Signed)
 Haley Beasley, you were admitted with sudden onset left sided weakness due to a stroke.  You were treated for this with TNK, and your symptoms improved.  Your MRI revelaed a stroke in the right thalamus.  You will need to take aspirin  and Plavix  daily for 3 weeks followed by aspirin  daily indefinitely.  Please follow up in the stroke clinic in 8 weeks and continue taking your Repatha .

## 2023-10-05 NOTE — Progress Notes (Signed)
 Physical Therapy Treatment Patient Details Name: Haley Beasley MRN: [id number] DOB: [birth year] Today's Date: 10/05/2023   History of Present Illness Pt is a 68 y.o. female who presented to the ED 1/11 with L side weakness. She received TNK. MRI 1/12 revealed punctate infarct in the anterior R thalamus. PMH:  anxiety, breast cancer, depression, GERD, hyperlipidemia, HTN, hypothyroidism, memory loss, OSA. s/p R ankle ORIF May 2024    PT Comments  Patient seen with focus on stair negotiation and education for fall prevention, level of supervision/assist for mobility and on follow up needs.  Patient appropriate for home with spouse support.  Reports plans to attend outpatient PT in [place].  No further equipment recommendations.  Pt for d/c home today.     If plan is discharge home, recommend the following:     Can travel by private vehicle        Equipment Recommendations  None recommended by PT    Recommendations for Other Services       Precautions / Restrictions Precautions Precautions: Fall Restrictions Weight Bearing Restrictions Per Provider Order: No     Mobility  Bed Mobility Overal bed mobility: Modified Independent                  Transfers Overall transfer level: Needs assistance Equipment used: Rolling walker (2 wheels) Transfers: Sit to/from Stand Sit to Stand: Contact guard assist           General transfer comment: assist for balance    Ambulation/Gait Ambulation/Gait assistance: Supervision Gait Distance (Feet): 225 Feet Assistive device: Rolling walker (2 wheels) Gait Pattern/deviations: Step-through pattern, Decreased stride length, Wide base of support       General Gait Details: increased time esp with turns; spouse guarding appropriately   Stairs Stairs: Yes Stairs assistance: Min assist Stair Management: No rails, Forwards, Step to pattern (spouse HHA) Number of Stairs: 2 General stair comments: cues for sequence, assist from  spouse after education due to no rails at home   Wheelchair Mobility     Tilt Bed    Modified Rankin (Stroke Patients Only) Modified Rankin (Stroke Patients Only) Pre-Morbid Rankin Score: No symptoms Modified Rankin: Moderately severe disability     Balance Overall balance assessment: Needs assistance Sitting-balance support: Feet supported Sitting balance-Leahy Scale: Good     Standing balance support: Bilateral upper extremity supported, No upper extremity supported Standing balance-Leahy Scale: Fair                              Cognition Arousal: Alert Behavior During Therapy: WFL for tasks assessed/performed Overall Cognitive Status: Within Functional Limits for tasks assessed                                          Exercises      General Comments General comments (skin integrity, edema, etc.): educated on fall prevention; footwear, lighting, slow to rise, clear pathways and using RW with spouse S.  C/o light headed on initial standing, sat to rest prior to attempting again and BP checked and was stable from sit to stand.  Discussed at home measuring BP for follow up with MD about meds.  Also discussed fall risk now on Plavix .      Pertinent Vitals/Pain Pain Assessment Pain Assessment: Faces Faces Pain Scale: Hurts a little bit Pain Location: headache  Pain Descriptors / Indicators: Discomfort, Headache Pain Intervention(s): Monitored during session    Home Living Family/patient expects to be discharged to:: Private residence Living Arrangements: Spouse/significant other Available Help at Discharge: Family;Available 24 hours/day Type of Home: House Home Access: Stairs to enter Entrance Stairs-Rails: None Entrance Stairs-Number of Steps: 2 (from garage)   Home Layout: One level Home Equipment: Hand held Programmer, Systems (2 wheels);Shower seat Additional Comments: no animals,    Prior Function            PT  Goals (current goals can now be found in the care plan section) Progress towards PT goals: Progressing toward goals    Frequency    Min 1X/week      PT Plan      Co-evaluation              AM-PAC PT 6 Clicks Mobility   Outcome Measure  Help needed turning from your back to your side while in a flat bed without using bedrails?: A Little Help needed moving from lying on your back to sitting on the side of a flat bed without using bedrails?: A Little Help needed moving to and from a bed to a chair (including a wheelchair)?: A Little Help needed standing up from a chair using your arms (e.g., wheelchair or bedside chair)?: A Little Help needed to walk in hospital room?: A Little Help needed climbing 3-5 steps with a railing? : A Little 6 Click Score: 18    End of Session Equipment Utilized During Treatment: Gait belt Activity Tolerance: Patient tolerated treatment well Patient left: in bed;with call bell/phone within reach;with family/visitor present   PT Visit Diagnosis: Other abnormalities of gait and mobility (R26.89)     Time: 8997-8972 PT Time Calculation (min) (ACUTE ONLY): 25 min  Charges:    $Gait Training: 8-22 mins $Self Care/Home Management: 8-22 PT General Charges $$ ACUTE PT VISIT: 1 Visit                     Micheline Portal, PT Acute Rehabilitation Services [phone] 10/05/2023    Montie Portal 10/05/2023, 2:16 PM

## 2023-10-05 NOTE — Evaluation (Signed)
 Occupational Therapy Evaluation Patient Details Name: Haley Beasley MRN: [id number] DOB: 10-18-1955 Today's Date: 10/05/2023   History of Present Illness Pt is a 68 y.o. female who presented to the ED 1/11 with L side weakness. She received TNK. MRI 1/12 revealed punctate infarct in the anterior R thalamus. PMH:  anxiety, breast cancer, depression, GERD, hyperlipidemia, HTN, hypothyroidism, memory loss, OSA. s/p R ankle ORIF May 2024   Clinical Impression   Patient evaluated by Occupational Therapy with no further acute OT needs identified. All education has been completed and the patient has no further questions. See below for any follow-up Occupational Therapy or equipment needs. OT to sign off. Thank you for referral.         If plan is discharge home, recommend the following:      Functional Status Assessment  Patient has had a recent decline in their functional status and demonstrates the ability to make significant improvements in function in a reasonable and predictable amount of time.  Equipment Recommendations  None recommended by OT (has RW at home)    Recommendations for Other Services       Precautions / Restrictions Precautions Precautions: Fall Restrictions Weight Bearing Restrictions Per Provider Order: No      Mobility Bed Mobility Overal bed mobility: Modified Independent                  Transfers Overall transfer level: Needs assistance   Transfers: Sit to/from Stand Sit to Stand: Min assist           General transfer comment: pt very guarded in movement and benefits from RW use. pt during session trial without RW to attempt to challenge balance      Balance Overall balance assessment: Needs assistance Sitting-balance support: No upper extremity supported, Feet supported Sitting balance-Leahy Scale: Good     Standing balance support: Bilateral upper extremity supported, During functional activity Standing balance-Leahy Scale: Fair                High level balance activites: Side stepping, Direction changes, Turns, Head turns High Level Balance Comments: pt benefits from RW for stability. pt decreased gait velocity noted. pt encouraged to correct posture and increase speed during session. pt noted to walk into doorway with L side during session           ADL either performed or assessed with clinical judgement   ADL Overall ADL's : Modified independent                                       General ADL Comments: demonstrates sink level grooming, LB dressing and basic transfers     Vision Baseline Vision/History: 1 Wears glasses Ability to See in Adequate Light: 0 Adequate Patient Visual Report: No change from baseline Vision Assessment?: No apparent visual deficits     Perception         Praxis         Pertinent Vitals/Pain Pain Assessment Pain Assessment: No/denies pain     Extremity/Trunk Assessment Upper Extremity Assessment Upper Extremity Assessment: Right hand dominant;LUE deficits/detail LUE Deficits / Details: reports feeling decreased fine motor, handout provided to help with fine motor and examples of home activities   Lower Extremity Assessment Lower Extremity Assessment: Defer to PT evaluation;Difficult to assess due to impaired cognition   Cervical / Trunk Assessment Cervical / Trunk Assessment: Normal   Communication  Communication Communication: No apparent difficulties   Cognition Arousal: Alert Behavior During Therapy: WFL for tasks assessed/performed Overall Cognitive Status: Within Functional Limits for tasks assessed                                       General Comments  VSS on RA    Exercises Exercises: Other exercises Other Exercises Other Exercises: fine motor handout provided   Shoulder Instructions      Home Living Family/patient expects to be discharged to:: Private residence Living Arrangements: Spouse/significant  other Available Help at Discharge: Family;Available 24 hours/day Type of Home: House Home Access: Stairs to enter [employer] of Steps: 2 (from garage) Entrance Stairs-Rails: None Home Layout: One level     Bathroom Shower/Tub: Producer, Television/film/video: Handicapped height     Home Equipment: [employer] Adviser held Programmer, Systems (2 wheels);Shower seat   Additional Comments: no animals,      Prior Functioning/Environment Prior Level of Function : Independent/Modified Independent;Driving;History of Falls (last six months)             Mobility Comments: amb without AD          OT Problem List:        OT Treatment/Interventions:      OT Goals(Current goals can be found in the care plan section) Acute Rehab OT Goals Potential to Achieve Goals: Good  OT Frequency:      Co-evaluation              AM-PAC OT 6 Clicks Daily Activity     Outcome Measure Help from another person eating meals?: None Help from another person taking care of personal grooming?: None Help from another person toileting, which includes using toliet, bedpan, or urinal?: A Little Help from another person bathing (including washing, rinsing, drying)?: A Little Help from another person to put on and taking off regular upper body clothing?: None Help from another person to put on and taking off regular lower body clothing?: A Little 6 Click Score: 21   End of Session Equipment Utilized During Treatment: Gait belt Nurse Communication: Mobility status;Precautions  Activity Tolerance: Patient tolerated treatment well Patient left: in bed;with call bell/phone within reach;with family/visitor present  OT Visit Diagnosis: Unsteadiness on feet (R26.81)                Time: 1228-1300 OT Time Calculation (min): 32 min Charges:  OT General Charges $OT Visit: 1 Visit OT Evaluation $OT Eval Moderate Complexity: 1 Mod OT Treatments $Self Care/Home Management : 8-22  mins   Brynn, OTR/L  Acute Rehabilitation Services Office: [phone] .   Ely Molt 10/05/2023, 1:32 PM

## 2023-10-08 ENCOUNTER — Encounter: Payer: Self-pay | Admitting: Physical Therapy

## 2023-10-08 ENCOUNTER — Ambulatory Visit: Payer: Medicare HMO | Attending: Nurse Practitioner | Admitting: Physical Therapy

## 2023-10-08 ENCOUNTER — Telehealth: Payer: Self-pay

## 2023-10-08 VITALS — BP 128/70 | HR 65

## 2023-10-08 DIAGNOSIS — R2681 Unsteadiness on feet: Secondary | ICD-10-CM | POA: Diagnosis not present

## 2023-10-08 DIAGNOSIS — M6281 Muscle weakness (generalized): Secondary | ICD-10-CM | POA: Diagnosis not present

## 2023-10-08 DIAGNOSIS — R2689 Other abnormalities of gait and mobility: Secondary | ICD-10-CM

## 2023-10-08 DIAGNOSIS — I69354 Hemiplegia and hemiparesis following cerebral infarction affecting left non-dominant side: Secondary | ICD-10-CM

## 2023-10-08 DIAGNOSIS — I639 Cerebral infarction, unspecified: Secondary | ICD-10-CM | POA: Insufficient documentation

## 2023-10-08 NOTE — Patient Outreach (Signed)
  Emmi Stroke Care Coordination Follow Up  10/08/2023 Name:  Haley Beasley MRN:  [id number] DOB:  Dec 23, 1955  Subjective: Haley Beasley is a 68 y.o. year old female who is a primary care patient of Crist Fat, MD An Emmi alert was received indicating patient responded to questions: Scheduled a follow-up appointment?. I reached out by phone to follow up on the alert and spoke to Patient.    Patient reports that she has scheduled her PCP follow up. Reports going to rehab,  Confirms that she has all her medications and is taking them as prescribed, Reviewed with patient when to call 911.  She voiced understanding.  Care Coordination Interventions:  Yes, provided   Follow up plan: No further intervention required.   Encounter Outcome:  Patient Visit Completed   Lonia Chimera, RN, BSN, CEN Population Health- Transition of Care Team.  [facility] [phone]

## 2023-10-08 NOTE — Progress Notes (Signed)
Received a red flag Emmi stroke notification. I have assigned a Nurse Care Coordinator to call for follow up and determine if there are any Case Management needs.    Iverson Alamin, Donivan Scull Heppner/ [facility]  [facility] Management Assistant [phone]

## 2023-10-08 NOTE — Therapy (Signed)
OUTPATIENT PHYSICAL THERAPY NEURO EVALUATION   Patient Name: Haley Beasley MRN: [id number] DOB:Feb 26, 1956, 68 y.o., female Today's Date: 10/08/2023   PCP: Crist Fat, MD REFERRING PROVIDER: Ernestina Columbia, Lennox Solders, NP  END OF SESSION:  PT End of Session - 10/08/23 1428     Visit Number 1    Number of Visits 9    Date for PT Re-Evaluation 11/06/23    Authorization Type [employer]    Authorization Time Period 10-08-23 - 11-20-23    PT Start Time 1045    PT Stop Time 1145    PT Time Calculation (min) 60 min    Equipment Utilized During Treatment Gait belt    Activity Tolerance Patient tolerated treatment well    Behavior During Therapy WFL for tasks assessed/performed             Past Medical History:  Diagnosis Date   Anxiety    Breast cancer (HCC)    Depression    GERD (gastroesophageal reflux disease)    Hyperlipidemia    Hypertension    Hypothyroidism    Memory loss    OSA (obstructive sleep apnea)    Past Surgical History:  Procedure Laterality Date   ABDOMINAL HYSTERECTOMY     ANKLE FRACTURE SURGERY Right    BREAST BIOPSY Left    BREAST RECONSTRUCTION     CATARACT EXTRACTION Bilateral    CHOLECYSTECTOMY     COLONOSCOPY W/ POLYPECTOMY     ENDOVENOUS ABLATION SAPHENOUS VEIN W/ LASER Right 01/2020   [place]   ENDOVENOUS ABLATION SAPHENOUS VEIN W/ LASER Left 05/2020   [place]   LYMPH NODE BIOPSY Left    AXILLARY   MASTECTOMY Bilateral    THYROIDECTOMY     TUBAL LIGATION Bilateral    WISDOM TOOTH EXTRACTION     Patient Active Problem List   Diagnosis Date Noted   Acute ischemic stroke (HCC) 10/03/2023   Statin intolerance 08/17/2023   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   Prediabetes 08/17/2023   Rash 06/15/2023   Breast cancer in female [facility] 06/06/2023   Hypercholesterolemia 05/08/2023   Age-related osteoporosis with current pathological fracture 05/08/2023   DDD (degenerative disc disease), lumbar 05/08/2023   OSA (obstructive sleep  apnea) 05/08/2023   Seasonal allergies 05/08/2023   Chronic venous insufficiency 05/08/2023   Moderate episode of recurrent major depressive disorder (HCC) 05/08/2023   GAD (generalized anxiety disorder) 05/08/2023   Closed fracture of right ankle 05/08/2023   BMI 29.0-29.9,adult 05/08/2023   History of breast cancer 05/08/2023   Hypothyroidism (acquired) 01/07/2023   Essential hypertension 01/07/2023   Age-related osteoporosis without current pathological fracture 10/06/2022   Hip pain 10/06/2022   COVID-19 08/20/2022   Strep throat 08/20/2022   Hyperlipidemia 06/25/2022   Genetic testing 02/04/2022    ONSET DATE: 10-03-23  REFERRING DIAG: I63.9 (ICD-10-CM) - Acute ischemic stroke (HCC)  THERAPY DIAG:  Muscle weakness (generalized)  Other abnormalities of gait and mobility  Unsteadiness on feet  Hemiplegia and hemiparesis following cerebral infarction affecting left non-dominant side (HCC)  Rationale for Evaluation and Treatment: Rehabilitation  SUBJECTIVE:  SUBJECTIVE STATEMENT: Pt had CVA on 10-03-23 and was discharged on 10-05-23:  using RW for household and community ambulation. Pt reports she is dizzy/light-headed at this time - gets dizzy with turning to left and looking up  Pt accompanied by:  husband, [name]  PERTINENT HISTORY: Per chart note "68 y.o. patient with history of breast cancer, anxiety, depression, GERD, hypertension, hyperlipidemia, hypothyroidism and sleep apnea was admitted with acute onset left-sided weakness.  Patient was given IV TNK after careful discussion about risk benefits and alternatives with patient.  She was kept in ICU and blood pressure was tightly controlled.  She showed rapid neurological improvement".  Pt admitted to [facility] on 10-03-23 with Rt ischemic CVA; was  discharged home on 10-05-23.  PAIN:  Are you having pain?  Pt reports she has a residual headache - rates it mild - 2/10 - is constant in occurrence - "will ease up but never goes away "  PRECAUTIONS: Fall  RED FLAGS: None   WEIGHT BEARING RESTRICTIONS: No  FALLS: Has patient fallen in last 6 months? Yes. Number of falls fell in May 2024 and fractured Rt foot and fell at mall due to shoes gripping carpet - sustained black eye and bloody nose  LIVING ENVIRONMENT: Lives with: lives with their spouse Lives in: House/apartment Stairs: Yes: External: 2 steps; none Has following equipment at home: Walker - 2 wheeled and Tour manager  PLOF: Independent; pt walked for exercise  PATIENT GOALS: "get back to were I was at before this happened"  OBJECTIVE:  Note: Objective measures were completed at Evaluation unless otherwise noted.  DIAGNOSTIC FINDINGS: MRI showed Rt thalamic CVA: Acute ischemic infarct:   Punctate infarct in the anterior right thalamus s/p TNK  COGNITION: Overall cognitive status: Within functional limits for tasks assessed   SENSATION: WFL  COORDINATION: WFL's LLE  POSTURE: No Significant postural limitations  LOWER EXTREMITY ROM:   WNL's bil. LE's   LOWER EXTREMITY MMT:    MMT Right Eval Left Eval  Hip flexion  3+  Hip extension  4-  Hip abduction  3+  Hip adduction    Hip internal rotation    Hip external rotation    Knee flexion  4  Knee extension  4  Ankle dorsiflexion  3+  Ankle plantarflexion  4-  Ankle inversion    Ankle eversion    (Blank rows = not tested)  BED MOBILITY:  Independent   TRANSFERS: Assistive device utilized: Environmental consultant - 2 wheeled  Sit to stand: Modified independence Stand to sit: Modified independence  STAIRS:  TBA  GAIT: Gait pattern: step through pattern; decreased head turns due to dizziness: slow speed, cautious gait pattern Distance walked: 100' Assistive device utilized: Environmental consultant - 2 wheeled; no device used  during eval Level of assistance: SBA Comments: did not use RW during eval  FUNCTIONAL TESTS:  Timed up and go (TUG): 22.03 secs with RW 10 meter walk test: 22.63 secs = 1.45 ft/sec without use of RW  Berg Balance Scale: 42/56   [facility] PT Assessment - 10/08/23 0001       Standardized Balance Assessment   Standardized Balance Assessment Berg Balance Test      Berg Balance Test   Sit to Stand Able to stand  independently using hands    Standing Unsupported Able to stand safely 2 minutes    Sitting with Back Unsupported but Feet Supported on Floor or Stool Able to sit safely and securely 2 minutes    Stand to  Sit Controls descent by using hands    Transfers Able to transfer safely, definite need of hands    Standing Unsupported with Eyes Closed Able to stand 10 seconds with supervision    Standing Unsupported with Feet Together Able to place feet together independently and stand for 1 minute with supervision    From Standing, Reach Forward with Outstretched Arm Can reach confidently >25 cm (10")    From Standing Position, Pick up Object from Floor Able to pick up shoe, needs supervision    From Standing Position, Turn to Look Behind Over each Shoulder Looks behind one side only/other side shows less weight shift    Turn 360 Degrees Needs close supervision or verbal cueing    Standing Unsupported, Alternately Place Feet on Step/Stool Able to complete >2 steps/needs minimal assist    Standing Unsupported, One Foot in Front Able to place foot tandem independently and hold 30 seconds    Standing on One Leg Able to lift leg independently and hold 5-10 seconds    Total Score 42            Turning = 8.31, 8.47 secs  PATIENT SURVEYS:  FOTO  - CVA LE - 59.9                                                                                                                               TREATMENT DATE: 10-08-23  Access Code: [email or web address] URL: [email or web address] Date:  10/08/2023 Prepared by: Maebelle Munroe  Exercises - Sit to Stand  - 1 x daily - 7 x weekly - 1 sets - 10 reps - Supine Active Straight Leg Raise  - 1 x daily - 7 x weekly - 3 sets - 10 reps - SIDELYING LEG LIFTS; ALSO DO CIRCLES CLOCKWISE & COUNTERCLOCKWISE  - 1 x daily - 7 x weekly - 3 sets - 10 reps - 3 sec hold - Standing Hip Extension with Counter Support  - 1 x daily - 7 x weekly - 3 sets - 10 reps - Standing Marching  - 1 x daily - 7 x weekly - 1 sets - 10 reps - Single Leg Stance  - 1 x daily - 7 x weekly - 1 sets - 2-3 reps - 10 sec hold - Romberg Stance  - 1 x daily - 7 x weekly - 1 sets - 1-2 reps - 30 sec hold  PATIENT EDUCATION: Education details: eval results & POC:  Medbridge HEP - see above Person educated: Patient and Spouse Education method: Explanation, Demonstration, and Handouts Education comprehension: verbalized understanding  HOME EXERCISE PROGRAM: [employer] [email or web address]  GOALS: Goals reviewed with patient? Yes   LONG TERM GOALS: Target date: 11-06-23  Improve Berg balance test score to >/= 50/56 to demo improved balance and to reduce fall risk. Baseline: 42/56 on 10-08-23 Goal status: INITIAL  2.  Improve TUG score to </= 14 secs without device to demo improved functional mobility. Baseline:  22.03 secs without use of RW Goal status: INITIAL  3.  Perform sit to stand without UE support from standard chair to demo improved LE strength. Baseline: bil. UE support used Goal status: INITIAL  4.   Perform FGA and set LTG as appropriate. Baseline:  Goal status: INITIAL  5.  Increase gait velocity to >/= 2.5 ft/sec without device for increased gait efficiency. Baseline: 22.63 secs = 1.45 ft/sec with RW Goal status: INITIAL  6.  Independent with HEP for balance and strengthening.   Baseline:  Goal status: INITIAL  ASSESSMENT:  CLINICAL IMPRESSION: Patient is a 68 y.o. lady who was seen today for physical therapy evaluation and treatment for gait and balance  impairments due to acute ischemic CVA sustained on 10-03-23.  Pt was hospitalized at [facility] from 10-03-23 -10-05-23.  Pt is currently ambulating with use of RW in home and community.  Pt reports dizziness/light-headedness residual of CVA and is unable to turn quickly.  Pt has decreased standing balance with Berg score 42/56 and TUG score of 22.03 secs without use of RW, both indicative of fall risk.  Pt has mild weakness in LLE.  Pt will benefit from PT to address gait and balance impairments and LLE weakness.  OBJECTIVE IMPAIRMENTS: Abnormal gait, decreased balance, decreased strength, and dizziness.   ACTIVITY LIMITATIONS: carrying, standing, squatting, stairs, and locomotion level  PARTICIPATION LIMITATIONS: meal prep, cleaning, laundry, driving, shopping, and community activity  PERSONAL FACTORS: 1 comorbidity: h/o breast cancer, age-related osteoporosis  are also affecting patient's functional outcome.   REHAB POTENTIAL: Good  CLINICAL DECISION MAKING: Evolving/moderate complexity  EVALUATION COMPLEXITY: Moderate  PLAN:  PT FREQUENCY: 2x/week  PT DURATION: 4 weeks  PLANNED INTERVENTIONS: 97110-Therapeutic exercises, 97530- Therapeutic activity, [id number]- Neuromuscular re-education, [phone]- Self Care, 60454- Gait training, [phone]- Aquatic Therapy, and Patient/Family education  PLAN FOR NEXT SESSION: do FGA:  check HEP - give theraband, add dorsiflexion exercise    Radford Pease, Donavan Burnet, PT 10/08/2023, 6:55 PM

## 2023-10-08 NOTE — Telephone Encounter (Signed)
Patient came into the office today to schedule a hospital follow up. We were able to get her set up for an appointment with Dr Pearlean Brownie, whom she saw in the hospital. At the end of the discussion, she advised that she was told to follow up with a neurophthalmologist once she was discharged. She asked if we can place a referral for neurophthalmology for her, she was given two names to consider:  Dr. Karleen Hampshire at [facility] -(ph) [phone] Dr Randon Goldsmith at [facility] - (ph) [phone]  Please advise if this referral can be placed for Haley Beasley. She let us know that she also has an appt with her PCP that she can discuss this with if we are unable to place the referral.  Thanks!

## 2023-10-12 ENCOUNTER — Encounter: Payer: Self-pay | Admitting: Internal Medicine

## 2023-10-12 ENCOUNTER — Ambulatory Visit: Payer: Medicare HMO | Admitting: Internal Medicine

## 2023-10-12 ENCOUNTER — Encounter: Payer: Self-pay | Admitting: Physical Therapy

## 2023-10-12 ENCOUNTER — Ambulatory Visit: Payer: Medicare HMO | Admitting: Physical Therapy

## 2023-10-12 VITALS — BP 126/86 | HR 94 | Temp 98.4°F | Resp 18 | Ht 68.0 in | Wt 185.8 lb

## 2023-10-12 DIAGNOSIS — M609 Myositis, unspecified: Secondary | ICD-10-CM | POA: Diagnosis not present

## 2023-10-12 DIAGNOSIS — H538 Other visual disturbances: Secondary | ICD-10-CM | POA: Diagnosis not present

## 2023-10-12 DIAGNOSIS — R109 Unspecified abdominal pain: Secondary | ICD-10-CM | POA: Diagnosis not present

## 2023-10-12 DIAGNOSIS — R2681 Unsteadiness on feet: Secondary | ICD-10-CM | POA: Diagnosis not present

## 2023-10-12 DIAGNOSIS — R2689 Other abnormalities of gait and mobility: Secondary | ICD-10-CM | POA: Diagnosis not present

## 2023-10-12 DIAGNOSIS — Z8673 Personal history of transient ischemic attack (TIA), and cerebral infarction without residual deficits: Secondary | ICD-10-CM

## 2023-10-12 DIAGNOSIS — Z789 Other specified health status: Secondary | ICD-10-CM

## 2023-10-12 DIAGNOSIS — T466X5A Adverse effect of antihyperlipidemic and antiarteriosclerotic drugs, initial encounter: Secondary | ICD-10-CM

## 2023-10-12 DIAGNOSIS — I679 Cerebrovascular disease, unspecified: Secondary | ICD-10-CM | POA: Diagnosis not present

## 2023-10-12 DIAGNOSIS — I10 Essential (primary) hypertension: Secondary | ICD-10-CM

## 2023-10-12 DIAGNOSIS — E7800 Pure hypercholesterolemia, unspecified: Secondary | ICD-10-CM

## 2023-10-12 DIAGNOSIS — M6281 Muscle weakness (generalized): Secondary | ICD-10-CM

## 2023-10-12 DIAGNOSIS — R7303 Prediabetes: Secondary | ICD-10-CM

## 2023-10-12 DIAGNOSIS — I639 Cerebral infarction, unspecified: Secondary | ICD-10-CM | POA: Diagnosis not present

## 2023-10-12 DIAGNOSIS — E782 Mixed hyperlipidemia: Secondary | ICD-10-CM | POA: Diagnosis not present

## 2023-10-12 DIAGNOSIS — I69354 Hemiplegia and hemiparesis following cerebral infarction affecting left non-dominant side: Secondary | ICD-10-CM | POA: Diagnosis not present

## 2023-10-12 NOTE — Assessment & Plan Note (Signed)
She is having burning and itching on her left flank.  I see no rash.  IF she develops vescicular lesions, she is to contact us.

## 2023-10-12 NOTE — Assessment & Plan Note (Signed)
Her risk factors include HTN, HLD, prediabetes.  She is on ASA and will continue plavix for 3 weeks.  She has a statin intolerance and is on PSK-4 inhibitor with repatha.  She is currently receiving therapies.  Fortunately, a lot of her strength returned.

## 2023-10-12 NOTE — Assessment & Plan Note (Signed)
Plan as below.

## 2023-10-12 NOTE — Assessment & Plan Note (Signed)
She had some blurred vision at the onset of her stroke and this did improve.  However, she needs to see a opthmaologist and we will refer her for this.

## 2023-10-12 NOTE — Progress Notes (Signed)
Office Visit  Subjective   Patient ID: Haley Beasley   DOB: 11-06-1955   Age: 68 y.o.   MRN: [id number]   Chief Complaint Chief Complaint  Patient presents with   Follow-up     History of Present Illness Haley Beasley is a 68 yo female who comes in today for a hospital followup where she was admitted to [facility] from 10/03/2023 until 10/05/2023.  She presented to the ER with sudden onset of left-sided numbness and weakness that started suddenly at 9 AM.  She woke up normal that morning and was going about her usual morning which was when she had a sudden onset of left-sided weakness.  She had an initial head CT which was negative.  They did a CTA of the head and neck that showed no significant proximal stenosis, aneurysm, or branch vessel occlusion within the Circle of Willis.   There was atherosclerotic changes at the carotid bifurcations bilaterally without significant stenosis.  There were metallic densities within the thyroid bed. Minimal residual thyroid tissue is present on the right and aortic atherosclerosis.  A MRI was performed on 10/04/2023 which demonstrated no acute intracranial abnormality.  There was moderate for age cerebral white matter signal changes, nonspecific but most commonly due to chronic small vessel disease.  However, she was seen by the stroke team and they had clinical suspicion that she had a stroke.  She was treated with TNK, and they re-reviewed her MRI of her brain as neurology felt clinicaly she had residual deficits that were suspicious for a thalamic stroke.  There was a subtle asymmetric diffusion at the ventral right thalamus seen on the MRI.  Ultimately, they felt she had an acute ischemic infarct with punctate infarct in the anterior right thalamus s/p TNK with etilogy small vessel disease in the setting of uncontrolled risk factors .  She had left hemiparesis with left-sided sensory loss.  She was admitted to the ICU and her BP was tightly controlled.   An ECHO was done on 10/03/2023 and this showed a normal LVEF of 60-65%.  Left ventricular diastolic function could not be evaluated. Her right ventricular systolic function was normal.  There was neurological improvement where she presented with deficits with mild left side facial droop and some blurred vision with left side weakness.  This improved overnight with the numbness resolving and her strength mostly returned.  She still has some weakness only involving her left hip and left foot.  She is currently in outpatient PT.  Follow-up brain imaging confirmed tiny punctate right thalamic infarct with no hemorrhage.  Patient was evaluated by therapist and felt safe to be discharged home.  Patient was counseled to be compliant with taking her Repatha as she had missed the last 2 doses.  She was sent home on ASA 81mg  daily and plavix 75mg  daily (x3 weeks).  Her LDL at that time was 139 and her HgBA1c was 5.9%.     The patient is a 68 year old female who presents for a follow-up evaluation of hypertension.  She was diagnosed with HTN in 2020.  The patient has been checking her blood pressure at home.  She states her systolic BP is in the 110-120's.  The patient's current medications include: losartan 25 mg daily. The patient has been tolerating her medications well. The patient denies any visual changes, dizziness, headaches, chest pain, lightheadness, palpitations, generalized weakness or edema. She reports there have been no other symptoms noted.  She does  not smoke.    Haley Beasley returns today for routine followup on her cholesterol. The patient does have an allergy to statins where she has been on lipitor, zocor and crestor and they all caused myalgias.  She is currently followed by the [facility] with her last visit in 06/2022. Overall, she states she is doing well and is without any complaints or problems at this time. She specifically denies chest pain, abdominal pain, nausea, diarrhea, and  myalgias. She remains on dietary management as well as the following cholesterol lowering medications: Repatha 140mg  twice a month.  She is intolerant to statins simvastatin, crestor, atorvastatin, pravastatin which all caused myalgias.  The patient is currently fasting for her labs today.   The patient also returns for followup of her prediabetes.  She was noted to be prediabetic on yearly labs in 04/2023 where her HgBA1c was incidentally found to be 5.9%.  She is currently not on any medications and I asked her to control this with diet and exercise.  Today, she denies any abdominal pain, nausea, vomiting or other problems.  She does not routinely check her FSBS.      Past Medical History Past Medical History:  Diagnosis Date   Anxiety    Breast cancer (HCC)    Depression    GERD (gastroesophageal reflux disease)    Hyperlipidemia    Hypertension    Hypothyroidism    Memory loss    OSA (obstructive sleep apnea)      Allergies Allergies  Allergen Reactions   Fluogen [Influenza Virus Vaccine]    Penicillins      Medications  Current Outpatient Medications:    ALPRAZolam (XANAX) 0.25 MG tablet, Take 1 tablet (0.25 mg total) by mouth daily as needed., Disp: 30 tablet, Rfl: 2   aspirin EC 81 MG tablet, Take 1 tablet (81 mg total) by mouth daily. Swallow whole., Disp: 30 tablet, Rfl: 12   clopidogrel (PLAVIX) 75 MG tablet, Take 1 tablet (75 mg total) by mouth daily., Disp: 21 tablet, Rfl: 0   Evolocumab (REPATHA SURECLICK) 140 MG/ML SOAJ, Inject 140 mg as directed every 14 (fourteen) days. (Patient taking differently: Inject 140 mg as directed every 14 (fourteen) days. Injection was due today, 10/03/2023. Last injection was 09/19/23.), Disp: 2 mL, Rfl: 0   fluticasone (FLONASE) 50 MCG/ACT nasal spray, Place 1 spray into both nostrils daily., Disp: 16 g, Rfl: 3   losartan (COZAAR) 25 MG tablet, Take 1 tablet (25 mg total) by mouth daily., Disp: 90 tablet, Rfl: 1   pantoprazole  (PROTONIX) 40 MG tablet, Take 1 tablet (40 mg total) by mouth daily., Disp: 90 tablet, Rfl: 0   sertraline (ZOLOFT) 25 MG tablet, Take 1 tablet (25 mg total) by mouth daily., Disp: 90 tablet, Rfl: 3   SYNTHROID 75 MCG tablet, TAKE 1 TABLET(75 MCG) BY MOUTH DAILY (Patient taking differently: Take 75 mcg by mouth daily before breakfast. TAKE 1 TABLET(75 MCG) BY MOUTH DAILY), Disp: 90 tablet, Rfl: 1   zaleplon (SONATA) 10 MG capsule, Take 10 mg by mouth at bedtime., Disp: , Rfl:    Review of Systems Review of Systems  Constitutional:  Negative for chills and fever.  Eyes:  Negative for blurred vision and double vision.  Respiratory:  Negative for shortness of breath.   Cardiovascular:  Negative for chest pain, palpitations and leg swelling.  Gastrointestinal:  Negative for abdominal pain, blood in stool, constipation, diarrhea, nausea and vomiting.  Genitourinary:  Negative for frequency.  Musculoskeletal:  Negative for myalgias.  Skin:  Negative for itching and rash.  Neurological:  Negative for dizziness, weakness and headaches.  Endo/Heme/Allergies:  Negative for polydipsia.       Objective:    Vitals BP 126/86   Pulse 94   Temp 98.4 F (36.9 C)   Resp 18   Ht 5\' 8"  (1.727 m)   Wt 185 lb 12.8 oz (84.3 kg)   SpO2 97%   BMI 28.25 kg/m    Physical Examination Physical Exam Constitutional:      Appearance: Normal appearance. She is not ill-appearing.  Cardiovascular:     Rate and Rhythm: Normal rate and regular rhythm.     Pulses: Normal pulses.     Heart sounds: No murmur heard.    No friction rub. No gallop.  Pulmonary:     Effort: Pulmonary effort is normal. No respiratory distress.     Breath sounds: No wheezing, rhonchi or rales.  Abdominal:     General: Bowel sounds are normal. There is no distension.     Palpations: Abdomen is soft.     Tenderness: There is no abdominal tenderness.  Musculoskeletal:     Right lower leg: No edema.     Left lower leg: No edema.   Skin:    General: Skin is warm and dry.     Findings: No rash.  Neurological:     Mental Status: She is alert.     Comments: CN II-XII are fully intact.  Her strength is 5/5 on right side and 5/5 in her LUE and 4/5 in her LLE.  Psychiatric:        Mood and Affect: Mood normal.        Behavior: Behavior normal.        Assessment & Plan:   Essential hypertension Her BP is controlled.  We will continue her curretn medications.  Cerebrovascular disease Her risk factors include HTN, HLD, prediabetes.  She is on ASA and will continue plavix for 3 weeks.  She has a statin intolerance and is on PSK-4 inhibitor with repatha.  She is currently receiving therapies.  Fortunately, a lot of her strength returned.  Statin-induced myositis Plan as below.  Prediabetes Her prediabetes has been controlled.  Continue with healthy eating and exercise.  Hyperlipidemia I want her to be on repatha for 2 months straight and we will repeat her FLP at that time.  Her goal LDL <70.  If it is not controlled at that time, we will add zetia to her regimen.  History of stroke She has residual weakness of her left hip and left foot.  She will continue on her therapies.  She goes to see neurology as an outpatient in 11/2023.  Blurred vision She had some blurred vision at the onset of her stroke and this did improve.  However, she needs to see a opthmaologist and we will refer her for this.  Flank pain She is having burning and itching on her left flank.  I see no rash.  IF she develops vescicular lesions, she is to contact us.    Return in about 2 months (around 12/10/2023).   Crist Fat, MD   Time spent with patient was 45 min to discuss treatment and referrals.

## 2023-10-12 NOTE — Assessment & Plan Note (Signed)
I want her to be on repatha for 2 months straight and we will repeat her FLP at that time.  Her goal LDL <70.  If it is not controlled at that time, we will add zetia to her regimen.

## 2023-10-12 NOTE — Therapy (Signed)
OUTPATIENT PHYSICAL THERAPY NEURO TREATMENT NOTE   Patient Name: Haley Beasley MRN: [id number] DOB:06-22-1956, 68 y.o., female Today's Date: 10/12/2023   PCP: Crist Fat, MD REFERRING PROVIDER: Ernestina Columbia, Lennox Solders, NP  END OF SESSION:  PT End of Session - 10/12/23 1251     Visit Number 2    Number of Visits 9    Date for PT Re-Evaluation 11/06/23    Authorization Type [employer]    Authorization Time Period 10-08-23 - 11-20-23    PT Start Time 1105    PT Stop Time 1152    PT Time Calculation (min) 47 min    Equipment Utilized During Treatment Gait belt    Activity Tolerance Patient tolerated treatment well    Behavior During Therapy WFL for tasks assessed/performed              Past Medical History:  Diagnosis Date   Anxiety    Breast cancer (HCC)    Depression    GERD (gastroesophageal reflux disease)    Hyperlipidemia    Hypertension    Hypothyroidism    Memory loss    OSA (obstructive sleep apnea)    Past Surgical History:  Procedure Laterality Date   ABDOMINAL HYSTERECTOMY     ANKLE FRACTURE SURGERY Right    BREAST BIOPSY Left    BREAST RECONSTRUCTION     CATARACT EXTRACTION Bilateral    CHOLECYSTECTOMY     COLONOSCOPY W/ POLYPECTOMY     ENDOVENOUS ABLATION SAPHENOUS VEIN W/ LASER Right 01/2020   [place]   ENDOVENOUS ABLATION SAPHENOUS VEIN W/ LASER Left 05/2020   [place]   LYMPH NODE BIOPSY Left    AXILLARY   MASTECTOMY Bilateral    THYROIDECTOMY     TUBAL LIGATION Bilateral    WISDOM TOOTH EXTRACTION     Patient Active Problem List   Diagnosis Date Noted   Acute ischemic stroke (HCC) 10/03/2023   Statin intolerance 08/17/2023   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   Prediabetes 08/17/2023   Rash 06/15/2023   Breast cancer in female [facility]) 06/06/2023   Hypercholesterolemia 05/08/2023   Age-related osteoporosis with current pathological fracture 05/08/2023   DDD (degenerative disc disease), lumbar 05/08/2023   OSA (obstructive  sleep apnea) 05/08/2023   Seasonal allergies 05/08/2023   Chronic venous insufficiency 05/08/2023   Moderate episode of recurrent major depressive disorder (HCC) 05/08/2023   GAD (generalized anxiety disorder) 05/08/2023   Closed fracture of right ankle 05/08/2023   BMI 29.0-29.9,adult 05/08/2023   History of breast cancer 05/08/2023   Hypothyroidism (acquired) 01/07/2023   Essential hypertension 01/07/2023   Age-related osteoporosis without current pathological fracture 10/06/2022   Hip pain 10/06/2022   COVID-19 08/20/2022   Strep throat 08/20/2022   Hyperlipidemia 06/25/2022   Genetic testing 02/04/2022    ONSET DATE: 10-03-23  REFERRING DIAG: I63.9 (ICD-10-CM) - Acute ischemic stroke (HCC)  THERAPY DIAG:  Muscle weakness (generalized)  Other abnormalities of gait and mobility  Unsteadiness on feet  Rationale for Evaluation and Treatment: Rehabilitation  SUBJECTIVE:  SUBJECTIVE STATEMENT: Pt reports she did her exercises on Friday - didn't do them over the weekend due to husband being out of town.  Says dizziness is about the same as it was last week; continues to have most difficulty with turning Pt accompanied by:  husband, [name]  PERTINENT HISTORY: Per chart note "68 y.o. patient with history of breast cancer, anxiety, depression, GERD, hypertension, hyperlipidemia, hypothyroidism and sleep apnea was admitted with acute onset left-sided weakness.  Patient was given IV TNK after careful discussion about risk benefits and alternatives with patient.  She was kept in ICU and blood pressure was tightly controlled.  She showed rapid neurological improvement".  Pt admitted to [facility] on 10-03-23 with Rt ischemic CVA; was discharged home on 10-05-23.  PAIN:  Are you having pain?  Pt reports she has a  residual headache - rates it mild - 2/10 - is constant in occurrence - "will ease up but never goes away "  PRECAUTIONS: Fall  RED FLAGS: None   WEIGHT BEARING RESTRICTIONS: No  FALLS: Has patient fallen in last 6 months? Yes. Number of falls fell in May 2024 and fractured Rt foot and fell at mall due to shoes gripping carpet - sustained black eye and bloody nose  LIVING ENVIRONMENT: Lives with: lives with their spouse Lives in: House/apartment Stairs: Yes: External: 2 steps; none Has following equipment at home: Walker - 2 wheeled and Tour manager  PLOF: Independent; pt walked for exercise  PATIENT GOALS: "get back to were I was at before this happened"  OBJECTIVE:  Note: Objective measures were completed at Evaluation unless otherwise noted.  DIAGNOSTIC FINDINGS: MRI showed Rt thalamic CVA: Acute ischemic infarct:   Punctate infarct in the anterior right thalamus s/p TNK  COGNITION: Overall cognitive status: Within functional limits for tasks assessed   SENSATION: WFL  COORDINATION: WFL's LLE  POSTURE: No Significant postural limitations  LOWER EXTREMITY ROM:   WNL's bil. LE's   LOWER EXTREMITY MMT:    MMT Right Eval Left Eval  Hip flexion  3+  Hip extension  4-  Hip abduction  3+  Hip adduction    Hip internal rotation    Hip external rotation    Knee flexion  4  Knee extension  4  Ankle dorsiflexion  3+  Ankle plantarflexion  4-  Ankle inversion    Ankle eversion    (Blank rows = not tested)  BED MOBILITY:  Independent   TRANSFERS: Assistive device utilized: Environmental consultant - 2 wheeled  Sit to stand: Modified independence Stand to sit: Modified independence  STAIRS:  TBA  GAIT: Gait pattern: step through pattern; decreased head turns due to dizziness: slow speed, cautious gait pattern Distance walked: 100' Assistive device utilized: Environmental consultant - 2 wheeled; no device used during eval Level of assistance: SBA Comments: did not use RW during  eval  FUNCTIONAL TESTS:  Timed up and go (TUG): 22.03 secs with RW 10 meter walk test: 22.63 secs = 1.45 ft/sec without use of RW  Berg Balance Scale: 42/56   FGA completed 10-12-23  [facility] PT Assessment - 10/12/23 0001       Functional Gait  Assessment   Gait assessed  Yes    Gait Level Surface Walks 20 ft in less than 7 sec but greater than 5.5 sec, uses assistive device, slower speed, mild gait deviations, or deviates 6-10 in outside of the 12 in walkway width.    Change in Gait Speed Able to change speed, demonstrates mild  gait deviations, deviates 6-10 in outside of the 12 in walkway width, or no gait deviations, unable to achieve a major change in velocity, or uses a change in velocity, or uses an assistive device.    Gait with Horizontal Head Turns Performs head turns with moderate changes in gait velocity, slows down, deviates 10-15 in outside 12 in walkway width but recovers, can continue to walk.    Gait with Vertical Head Turns Performs task with slight change in gait velocity (eg, minor disruption to smooth gait path), deviates 6 - 10 in outside 12 in walkway width or uses assistive device    Gait and Pivot Turn Turns slowly, requires verbal cueing, or requires several small steps to catch balance following turn and stop    Step Over Obstacle Is able to step over one shoe box (4.5 in total height) but must slow down and adjust steps to clear box safely. May require verbal cueing.    Gait with Narrow Base of Support Ambulates 7-9 steps.    Gait with Eyes Closed Walks 20 ft, slow speed, abnormal gait pattern, evidence for imbalance, deviates 10-15 in outside 12 in walkway width. Requires more than 9 sec to ambulate 20 ft.    Ambulating Backwards Walks 20 ft, uses assistive device, slower speed, mild gait deviations, deviates 6-10 in outside 12 in walkway width.    Steps Alternating feet, must use rail.    Total Score 16            Turning = 8.31, 8.47 secs  PATIENT SURVEYS:   FOTO  - CVA LE - 59.9                                                                                                                               TREATMENT DATE: 10-12-23   Gait:  FGA score 16/30 (see above)  Pt using RW for assistance with ambulation; did not use during PT session  TherEx: updated HEP to include Lt hip musc. Strengthening with use of red theraband  Pt performed sit to stand transfers from mat table with EO 3 reps:  EC 5 reps with SBA     Medbridge Access Code: [email or web address] URL: [email or web address] Date: 10/12/2023 Prepared by: Maebelle Munroe  Exercises - Supine Hip Flexion with Resistance  - 1 x daily - 7 x weekly - 2 sets - 10 reps - Supine Hip Flexion with Resistance Loop  - 1 x daily - 7 x weekly - 2 sets - 10 reps - Standing Hip Extension with Resistance  - 1 x daily - 7 x weekly - 2 sets - 10 reps - Clam with Resistance  - 1 x daily - 7 x weekly - 2 sets - 10 reps - Sidelying Hip Abduction with Resistance at Thighs  - 1 x daily - 7 x weekly - 2 sets - 10 reps - Marching with Resistance  - 1 x daily - 7 x weekly -  2 sets - 10 reps  NeuroRe-ed:  mCTSIB:  Condition 1 - 30 secs :  Condition 2 - 30 secs with min. Postural sway:  Condition 3:  30 secs with min to mod postural sway:  Condition 4; 30 secs with mod postural sway but no total LOB Habituation - pt performed sit to stand and pivot turn to Rt and Lt sides 3 reps; turned 360 degrees to each side 2 reps with break between reps  Access Code: [email or web address] URL: [email or web address] Date: 10/08/2023 Prepared by: Maebelle Munroe  Exercises - Sit to Stand  - 1 x daily - 7 x weekly - 1 sets - 10 reps - Supine Active Straight Leg Raise  - 1 x daily - 7 x weekly - 3 sets - 10 reps - SIDELYING LEG LIFTS; ALSO DO CIRCLES CLOCKWISE & COUNTERCLOCKWISE  - 1 x daily - 7 x weekly - 3 sets - 10 reps - 3 sec hold - Standing Hip Extension with Counter Support  - 1 x daily - 7 x weekly - 3 sets - 10  reps - Standing Marching  - 1 x daily - 7 x weekly - 1 sets - 10 reps - Single Leg Stance  - 1 x daily - 7 x weekly - 1 sets - 2-3 reps - 10 sec hold - Romberg Stance  - 1 x daily - 7 x weekly - 1 sets - 1-2 reps - 30 sec hold  PATIENT EDUCATION: Education details: Medbridge HEP updated -[email or web address]; also instructed pt to amb. Next to counter in her kitchen with horizontal head turns and do standing on floor with feet together with EC Person educated: Patient and Spouse Education method: Explanation, Demonstration, and Handouts Education comprehension: verbalized understanding  HOME EXERCISE PROGRAM: Medbridge [email or web address]  GOALS: Goals reviewed with patient? Yes   LONG TERM GOALS: Target date: 11-06-23  Improve Berg balance test score to >/= 50/56 to demo improved balance and to reduce fall risk. Baseline: 42/56 on 10-08-23 Goal status: INITIAL  2.  Improve TUG score to </= 14 secs without device to demo improved functional mobility. Baseline: 22.03 secs without use of RW Goal status: INITIAL  3.  Perform sit to stand without UE support from standard chair to demo improved LE strength. Baseline: bil. UE support used Goal status: INITIAL  4.   Perform FGA and set LTG as appropriate. Baseline:  Goal status: INITIAL  5.  Increase gait velocity to >/= 2.5 ft/sec without device for increased gait efficiency. Baseline: 22.63 secs = 1.45 ft/sec with RW Goal status: INITIAL  6.  Independent with HEP for balance and strengthening.   Baseline:  Goal status: INITIAL  ASSESSMENT:  CLINICAL IMPRESSION: PT session focused on further gait and balance assessment with pt scoring 16/30 on FGA, indicative of fall risk.  Pt appears to have decreased vestibular input in maintaining balance as evidenced by postural instability and unsteadiness with amb. With head turns (horizontal > vertical) and with standing on compliant surface with EC.  Pt requires approx. 4 secs to turn 180 degrees to Rt and Lt  sides but had no complete LOB with turning.  Cont with POC.  OBJECTIVE IMPAIRMENTS: Abnormal gait, decreased balance, decreased strength, and dizziness.   ACTIVITY LIMITATIONS: carrying, standing, squatting, stairs, and locomotion level  PARTICIPATION LIMITATIONS: meal prep, cleaning, laundry, driving, shopping, and community activity  PERSONAL FACTORS: 1 comorbidity: h/o breast cancer, age-related osteoporosis  are also affecting patient's functional outcome.   REHAB POTENTIAL: Good  CLINICAL  DECISION MAKING: Evolving/moderate complexity  EVALUATION COMPLEXITY: Moderate  PLAN:  PT FREQUENCY: 2x/week  PT DURATION: 4 weeks  PLANNED INTERVENTIONS: 97110-Therapeutic exercises, 97530- Therapeutic activity, [id number]- Neuromuscular re-education, [phone]- Self Care, 16010- Gait training, [phone]- Aquatic Therapy, and Patient/Family education  PLAN FOR NEXT SESSION: check updated HEP:  continue balance/vestibular exercises - add standing on foam; dynamic gait training   Drexel Ivey, Donavan Burnet, PT 10/12/2023, 12:55 PM

## 2023-10-12 NOTE — Assessment & Plan Note (Addendum)
She has residual weakness of her left hip and left foot.  She will continue on her therapies.  She goes to see neurology as an outpatient in 11/2023.

## 2023-10-12 NOTE — Assessment & Plan Note (Signed)
Her BP is controlled.  We will continue her curretn medications.

## 2023-10-12 NOTE — Assessment & Plan Note (Signed)
Her prediabetes has been controlled.  Continue with healthy eating and exercise.

## 2023-10-13 NOTE — Progress Notes (Deleted)
Cardiology Office Note   Date:  10/13/2023   ID:  Haley Beasley, Haley Beasley 04-Feb-1956, MRN [id number]  PCP:  Crist Fat, MD  Cardiologist:   Shamiah Kahler [place], MD   No chief complaint on file.     History of Present Illness: Haley Beasley is a 68 y.o. female who is seen at the request of Dr Nelson Chimes for evaluation of Hyperlipidemia and venous insufficiency.  She has a history of HTN, OSA, and thyroid disease. I am also seeing her husband Haley Beasley. They have moved from [place] and establishing care. In February 2022 she had RF ablation of the small saphenous vein and bilateral great saphenous vein for painful varicose veins. She still complains of pain in her legs especially at night or with prolonged standing. (Records copied in Media under AMB correspondence). Other evaluation there include normal carotid dopplers. In November 2020 she had a normal Echo and normal nuclear stress test.   She has HLD and is intolerant of multiple statins. Only taking Zetia. She comes from a family of 15 children. Multiple family members have HLD and HTN. Only one brother had CABG. Her son did die in his sleep and apparently had CHF.   Since her last visit she did see Dr Edilia Bo for evaluation of her venous disease. Conservative measures recommended unless symptoms progress. Carotid dopplers done showing plaque but no obstruction. Bruit likely related to disturbed flow in subclavian.   She is wearing knee high compression hose. Elevating legs with improvement in her leg heaviness.  We did obtain a coronary calcium score which was 32.   More recently she was admitted with acute CVA with left hemiparesis. She received TNK. 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 Echo was unremarkable. Was DC on ASA and Plavix for 3 weeks then ASA alone.     Past Medical History:  Diagnosis Date   Anxiety    Breast cancer (HCC)    Depression    GERD (gastroesophageal reflux disease)     Hyperlipidemia    Hypertension    Hypothyroidism    Memory loss    OSA (obstructive sleep apnea)     Past Surgical History:  Procedure Laterality Date   ABDOMINAL HYSTERECTOMY     ANKLE FRACTURE SURGERY Right    BREAST BIOPSY Left    BREAST RECONSTRUCTION     CATARACT EXTRACTION Bilateral    CHOLECYSTECTOMY     COLONOSCOPY W/ POLYPECTOMY     ENDOVENOUS ABLATION SAPHENOUS VEIN W/ LASER Right 01/2020   [place]   ENDOVENOUS ABLATION SAPHENOUS VEIN W/ LASER Left 05/2020   [place]   LYMPH NODE BIOPSY Left    AXILLARY   MASTECTOMY Bilateral    THYROIDECTOMY     TUBAL LIGATION Bilateral    WISDOM TOOTH EXTRACTION       Current Outpatient Medications  Medication Sig Dispense Refill   ALPRAZolam (XANAX) 0.25 MG tablet Take 1 tablet (0.25 mg total) by mouth daily as needed. 30 tablet 2   aspirin EC 81 MG tablet Take 1 tablet (81 mg total) by mouth daily. Swallow whole. 30 tablet 12   clopidogrel (PLAVIX) 75 MG tablet Take 1 tablet (75 mg total) by mouth daily. 21 tablet 0   Evolocumab (REPATHA SURECLICK) 140 MG/ML SOAJ Inject 140 mg as directed every 14 (fourteen) days. (Patient taking differently: Inject 140 mg as directed every 14 (fourteen) days. Injection was due today, 10/03/2023. Last injection was 09/19/23.)  2 mL 0   fluticasone (FLONASE) 50 MCG/ACT nasal spray Place 1 spray into both nostrils daily. 16 g 3   losartan (COZAAR) 25 MG tablet Take 1 tablet (25 mg total) by mouth daily. 90 tablet 1   pantoprazole (PROTONIX) 40 MG tablet Take 1 tablet (40 mg total) by mouth daily. 90 tablet 0   sertraline (ZOLOFT) 25 MG tablet Take 1 tablet (25 mg total) by mouth daily. 90 tablet 3   SYNTHROID 75 MCG tablet TAKE 1 TABLET(75 MCG) BY MOUTH DAILY (Patient taking differently: Take 75 mcg by mouth daily before breakfast. TAKE 1 TABLET(75 MCG) BY MOUTH DAILY) 90 tablet 1   zaleplon (SONATA) 10 MG capsule Take 10 mg by mouth at bedtime.     No current facility-administered medications  for this visit.    Allergies:   Fluogen [influenza virus vaccine] and Penicillins    Social History:  The patient  reports that she quit smoking about 16 years ago. Her smoking use included cigarettes. She has never used smokeless tobacco. She reports current alcohol use. She reports that she does not currently use drugs.   Family History:  The patient's family history includes Alzheimer's disease in her sister; Asthma in her brother; Brain cancer in her maternal uncle; Breast cancer in her cousin, cousin, and cousin; Cancer in her brother and half-brother; Colon cancer (age of onset: 61) in her mother; Dementia in her sister and sister; Heart attack in her son; Heart attack (age of onset: 61) in her father; Heart disease in her brother, father, and sister; Heart disease (age of onset: 56) in her son; Heart failure in her mother; Leukemia in her cousin; Lung cancer in her brother; Pancreatic cancer in her brother; Parkinson's disease in her sister; Renal cancer in her brother and paternal uncle.    ROS:  Please see the history of present illness.   Otherwise, review of systems are positive for none.   All other systems are reviewed and negative.    PHYSICAL EXAM: VS:  There were no vitals taken for this visit. , BMI There is no height or weight on file to calculate BMI. GEN: Well nourished, well developed, in no acute distress HEENT: normal Neck: no JVD, carotid bruits, or masses Cardiac: RRR; no murmurs, rubs, or gallops,no edema. LE varicosities Respiratory:  clear to auscultation bilaterally, normal work of breathing GI: soft, nontender, nondistended, + BS MS: no deformity or atrophy Skin: warm and dry, no rash Neuro:  Strength and sensation are intact Psych: euthymic mood, full affect   EKG:  EKG is  not ordered today.     Recent Labs: 05/08/2023: TSH 2.700 10/03/2023: ALT 17; BUN 15; Creatinine, Ser 0.70; Hemoglobin 13.6; Platelets 335; Potassium 3.6; Sodium 137    Lipid  Panel    Component Value Date/Time   CHOL 220 (H) 10/04/2023 0410   CHOL 227 (H) 05/08/2023 1005   TRIG 119 10/04/2023 0410   HDL 57 10/04/2023 0410   HDL 76 05/08/2023 1005   CHOLHDL 3.9 10/04/2023 0410   VLDL 24 10/04/2023 0410   LDLCALC 139 (H) 10/04/2023 0410   LDLCALC 134 (H) 05/08/2023 1005     Dated 10/22/21: cholesterol 254, triglycerides 123, HDL 72, LDL 160. A1c 5.9%. CMET, CBC normal.  Dated 12/24/21: normal TSH  Wt Readings from Last 3 Encounters:  10/12/23 185 lb 12.8 oz (84.3 kg)  10/03/23 189 lb 9.5 oz (86 kg)  08/29/23 192 lb (87.1 kg)      Other  studies Reviewed: Additional studies/ records that were reviewed today include:  Coronary Calcium Score   TECHNIQUE: A gated, non-contrast computed tomography scan of the heart was performed using 3mm slice thickness. Axial images were analyzed on a dedicated workstation. Calcium scoring of the coronary arteries was performed using the Agatston method.   FINDINGS: Coronary arteries: Normal origins.   Coronary Calcium Score:   Left main: 0   Left anterior descending artery: 30   Left circumflex artery: 2   Right coronary artery: 0   Total: 32   Percentile: 65th   Pericardium: Normal.   Aorta: Normal caliber of ascending aorta. No aortic atherosclerosis noted.   Non-cardiac: See separate report from [facility].   IMPRESSION: Coronary calcium score of 32. This was 65th percentile for age-, race-, and sex-matched controls.  Echo 10/03/23: IMPRESSIONS     1. No apical views.   2. Left ventricular ejection fraction, by estimation, is 60 to 65%. The  left ventricle has normal function. Left ventricular diastolic function  could not be evaluated.   3. Right ventricular systolic function is normal. The right ventricular  size is normal. There is normal pulmonary artery systolic pressure.   4. The mitral valve is normal in structure. Trivial mitral valve  regurgitation.   5. The aortic  valve is tricuspid. Aortic valve regurgitation is not  visualized.   ASSESSMENT AND PLAN:  1.  Hypercholesterolemia. LDL 160 despite Zetia. Intolerant of statins. Coronary calcium score of 32. Some carotid plaque. Need to consider lipid lowering therapy. Will refer to Pharm D for consideration of injectable therapy 2. Normal Echo and nuclear stress test in 2020 3. Venous stasis disease. Improved symptoms with conservative therapy. Appreciate Dr Adele Dan input. 4. HTN well controlled on losartan 5. Recent right thalamic CVA s/p TNK   Current medicines are reviewed at length with the patient today.  The patient does not have concerns regarding medicines.  The following changes have been made:  no change  Labs/ tests ordered today include:   No orders of the defined types were placed in this encounter.        Disposition:   FU with me in 6 months  Signed, Adryanna Friedt [place], MD  10/13/2023 7:40 AM    [facility] [address], [place], [place], [address] Phone [phone], Fax [phone]

## 2023-10-14 ENCOUNTER — Ambulatory Visit: Payer: Medicare HMO | Attending: Cardiology | Admitting: Cardiology

## 2023-10-14 ENCOUNTER — Ambulatory Visit: Payer: Medicare HMO | Admitting: Cardiology

## 2023-10-14 ENCOUNTER — Encounter: Payer: Self-pay | Admitting: *Deleted

## 2023-10-14 VITALS — BP 112/74 | HR 70 | Ht 68.0 in | Wt 187.0 lb

## 2023-10-14 DIAGNOSIS — I2510 Atherosclerotic heart disease of native coronary artery without angina pectoris: Secondary | ICD-10-CM | POA: Diagnosis not present

## 2023-10-14 DIAGNOSIS — E785 Hyperlipidemia, unspecified: Secondary | ICD-10-CM

## 2023-10-14 DIAGNOSIS — I639 Cerebral infarction, unspecified: Secondary | ICD-10-CM | POA: Diagnosis not present

## 2023-10-14 NOTE — Telephone Encounter (Signed)
Spoke with patient regarding appointment cancellation due to inclement weather. She is aware that a scheduler will contact him later today to reschedule appointment.

## 2023-10-14 NOTE — Progress Notes (Signed)
Cardiology Office Note   Date:  10/14/2023   ID:  Haley Beasley, Haley Beasley [birth year], MRN [id number]  PCP:  Crist Fat, MD  Cardiologist:   Tilden Broz [place], MD   No chief complaint on file.     History of Present Illness: Haley Beasley is a 68 y.o. female who is seen for follow up of Hyperlipidemia and venous insufficiency.  She has a history of HTN, OSA, and thyroid disease. I am also seeing her husband [name]. They have moved from [place] and establishing care. In February 2022 she had RF ablation of the small saphenous vein and bilateral great saphenous vein for painful varicose veins. She still complains of pain in her legs especially at night or with prolonged standing. (Records copied in Media under AMB correspondence). Other evaluation there include normal carotid dopplers. In November 2020 she had a normal Echo and normal nuclear stress test.   She has HLD and is intolerant of multiple statins. Was only taking Zetia. Multiple family members have HLD and HTN. Only one brother had CABG. Her son did die in his sleep and apparently had CHF.   We did obtain a coronary calcium score which was 32. She was started on Repatha in Oct 2023  More recently she was admitted with acute CVA with left hemiparesis. She received TNK. 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 Echo was unremarkable. Was DC on ASA and Plavix for 3 weeks then ASA alone. LDL was 134. She has an excellent recovery with resolution of her hemiparesis. Still notes some dizziness and mild HA    Past Medical History:  Diagnosis Date   Anxiety    Breast cancer (HCC)    Depression    GERD (gastroesophageal reflux disease)    Hyperlipidemia    Hypertension    Hypothyroidism    Memory loss    OSA (obstructive sleep apnea)     Past Surgical History:  Procedure Laterality Date   ABDOMINAL HYSTERECTOMY     ANKLE FRACTURE SURGERY Right    BREAST BIOPSY Left    BREAST  RECONSTRUCTION     CATARACT EXTRACTION Bilateral    CHOLECYSTECTOMY     COLONOSCOPY W/ POLYPECTOMY     ENDOVENOUS ABLATION SAPHENOUS VEIN W/ LASER Right 01/2020   [place]   ENDOVENOUS ABLATION SAPHENOUS VEIN W/ LASER Left 05/2020   [place]   LYMPH NODE BIOPSY Left    AXILLARY   MASTECTOMY Bilateral    THYROIDECTOMY     TUBAL LIGATION Bilateral    WISDOM TOOTH EXTRACTION       Current Outpatient Medications  Medication Sig Dispense Refill   ALPRAZolam (XANAX) 0.25 MG tablet Take 1 tablet (0.25 mg total) by mouth daily as needed. 30 tablet 2   aspirin EC 81 MG tablet Take 1 tablet (81 mg total) by mouth daily. Swallow whole. 30 tablet 12   clopidogrel (PLAVIX) 75 MG tablet Take 1 tablet (75 mg total) by mouth daily. 21 tablet 0   Evolocumab (REPATHA SURECLICK) 140 MG/ML SOAJ Inject 140 mg as directed every 14 (fourteen) days. (Patient taking differently: Inject 140 mg as directed every 14 (fourteen) days. Injection was due today, 10/03/2023. Last injection was 09/19/23.) 2 mL 0   fluticasone (FLONASE) 50 MCG/ACT nasal spray Place 1 spray into both nostrils daily. 16 g 3   losartan (COZAAR) 25 MG tablet Take 1 tablet (25 mg total) by mouth daily. 90 tablet 1  pantoprazole (PROTONIX) 40 MG tablet Take 1 tablet (40 mg total) by mouth daily. 90 tablet 0   sertraline (ZOLOFT) 25 MG tablet Take 1 tablet (25 mg total) by mouth daily. 90 tablet 3   SYNTHROID 75 MCG tablet TAKE 1 TABLET(75 MCG) BY MOUTH DAILY (Patient taking differently: Take 75 mcg by mouth daily before breakfast. TAKE 1 TABLET(75 MCG) BY MOUTH DAILY) 90 tablet 1   zaleplon (SONATA) 10 MG capsule Take 10 mg by mouth at bedtime.     No current facility-administered medications for this visit.    Allergies:   Fluogen [influenza virus vaccine] and Penicillins    Social History:  The patient  reports that she quit smoking about 16 years ago. Her smoking use included cigarettes. She has never used smokeless tobacco. She reports  current alcohol use. She reports that she does not currently use drugs.   Family History:  The patient's family history includes Alzheimer's disease in her sister; Asthma in her brother; Brain cancer in her maternal uncle; Breast cancer in her cousin, cousin, and cousin; Cancer in her brother and half-brother; Colon cancer (age of onset: 31) in her mother; Dementia in her sister and sister; Heart attack in her son; Heart attack (age of onset: 52) in her father; Heart disease in her brother, father, and sister; Heart disease (age of onset: 5) in her son; Heart failure in her mother; Leukemia in her cousin; Lung cancer in her brother; Pancreatic cancer in her brother; Parkinson's disease in her sister; Renal cancer in her brother and paternal uncle.    ROS:  Please see the history of present illness.   Otherwise, review of systems are positive for none.   All other systems are reviewed and negative.    PHYSICAL EXAM: VS:  BP 112/74 (BP Location: Right Arm, Patient Position: Sitting, Cuff Size: Normal)   Pulse 70   Ht 5\' 8"  (1.727 m)   Wt 187 lb (84.8 kg)   SpO2 95%   BMI 28.43 kg/m  , BMI Body mass index is 28.43 kg/m. GEN: Well nourished, well developed, in no acute distress HEENT: normal Neck: no JVD, carotid bruits, or masses Cardiac: RRR; no murmurs, rubs, or gallops,no edema. LE varicosities Respiratory:  clear to auscultation bilaterally, normal work of breathing GI: soft, nontender, nondistended, + BS MS: no deformity or atrophy Skin: warm and dry, no rash Neuro:  Strength and sensation are intact Psych: euthymic mood, full affect   EKG:  EKG is  not ordered today.     Recent Labs: 05/08/2023: TSH 2.700 10/03/2023: ALT 17; BUN 15; Creatinine, Ser 0.70; Hemoglobin 13.6; Platelets 335; Potassium 3.6; Sodium 137    Lipid Panel    Component Value Date/Time   CHOL 220 (H) 10/04/2023 0410   CHOL 227 (H) 05/08/2023 1005   TRIG 119 10/04/2023 0410   HDL 57 10/04/2023 0410    HDL 76 05/08/2023 1005   CHOLHDL 3.9 10/04/2023 0410   VLDL 24 10/04/2023 0410   LDLCALC 139 (H) 10/04/2023 0410   LDLCALC 134 (H) 05/08/2023 1005     Dated 10/22/21: cholesterol 254, triglycerides 123, HDL 72, LDL 160. A1c 5.9%. CMET, CBC normal.  Dated 12/24/21: normal TSH  Wt Readings from Last 3 Encounters:  10/14/23 187 lb (84.8 kg)  10/12/23 185 lb 12.8 oz (84.3 kg)  10/03/23 189 lb 9.5 oz (86 kg)      Other studies Reviewed: Additional studies/ records that were reviewed today include:  Coronary Calcium Score  TECHNIQUE: A gated, non-contrast computed tomography scan of the heart was performed using 3mm slice thickness. Axial images were analyzed on a dedicated workstation. Calcium scoring of the coronary arteries was performed using the Agatston method.   FINDINGS: Coronary arteries: Normal origins.   Coronary Calcium Score:   Left main: 0   Left anterior descending artery: 30   Left circumflex artery: 2   Right coronary artery: 0   Total: 32   Percentile: 65th   Pericardium: Normal.   Aorta: Normal caliber of ascending aorta. No aortic atherosclerosis noted.   Non-cardiac: See separate report from [facility].   IMPRESSION: Coronary calcium score of 32. This was 65th percentile for age-, race-, and sex-matched controls.  Echo 10/03/23: IMPRESSIONS     1. No apical views.   2. Left ventricular ejection fraction, by estimation, is 60 to 65%. The  left ventricle has normal function. Left ventricular diastolic function  could not be evaluated.   3. Right ventricular systolic function is normal. The right ventricular  size is normal. There is normal pulmonary artery systolic pressure.   4. The mitral valve is normal in structure. Trivial mitral valve  regurgitation.   5. The aortic valve is tricuspid. Aortic valve regurgitation is not  visualized.   ASSESSMENT AND PLAN:  1.  Hypercholesterolemia. LDL 160 on Zetia. Intolerant of  statins. Coronary calcium score of 32. Now on Repatha. PCP is going to repeat lipids - if not at goal resume Zetia.  2. Normal Echo and nuclear stress test in 2020 3. Venous stasis disease. Improved symptoms with conservative therapy. Appreciate Dr Adele Dan input. 4. HTN well controlled on losartan 5. Recent right thalamic CVA s/p TNK- improved.    Current medicines are reviewed at length with the patient today.  The patient does not have concerns regarding medicines.  The following changes have been made:  no change  Labs/ tests ordered today include:   No orders of the defined types were placed in this encounter.        Disposition:   FU with me in one year  Signed, Jonty Morrical [place], MD  10/14/2023 4:16 PM    [facility] [address] [place], [place], [address] Phone [phone], Fax [phone]

## 2023-10-14 NOTE — Patient Instructions (Signed)

## 2023-10-14 NOTE — Telephone Encounter (Signed)
Rescheduled patient to 4pm slot with Dr. [place]. She expressed understanding and appreciation of call.

## 2023-10-15 ENCOUNTER — Ambulatory Visit: Payer: Medicare HMO | Admitting: Physical Therapy

## 2023-10-15 DIAGNOSIS — R2689 Other abnormalities of gait and mobility: Secondary | ICD-10-CM | POA: Diagnosis not present

## 2023-10-15 DIAGNOSIS — R2681 Unsteadiness on feet: Secondary | ICD-10-CM

## 2023-10-15 DIAGNOSIS — M6281 Muscle weakness (generalized): Secondary | ICD-10-CM | POA: Diagnosis not present

## 2023-10-15 DIAGNOSIS — I69354 Hemiplegia and hemiparesis following cerebral infarction affecting left non-dominant side: Secondary | ICD-10-CM | POA: Diagnosis not present

## 2023-10-15 DIAGNOSIS — I639 Cerebral infarction, unspecified: Secondary | ICD-10-CM | POA: Diagnosis not present

## 2023-10-15 NOTE — Therapy (Signed)
OUTPATIENT PHYSICAL THERAPY NEURO TREATMENT NOTE   Patient Name: Haley Beasley MRN: [id number] DOB:Jun 20, 1956, 68 y.o., female Today's Date: 10/16/2023   PCP: Crist Fat, MD REFERRING PROVIDER: Ernestina Columbia, Lennox Solders, NP  END OF SESSION:  PT End of Session - 10/16/23 1622     Visit Number 3    Number of Visits 9    Date for PT Re-Evaluation 11/06/23    Authorization Type [employer]    Authorization Time Period 10-08-23 - 11-20-23    PT Start Time 1103    PT Stop Time 1146    PT Time Calculation (min) 43 min    Equipment Utilized During Treatment Gait belt    Activity Tolerance Patient tolerated treatment well    Behavior During Therapy WFL for tasks assessed/performed               Past Medical History:  Diagnosis Date   Anxiety    Breast cancer (HCC)    Depression    GERD (gastroesophageal reflux disease)    Hyperlipidemia    Hypertension    Hypothyroidism    Memory loss    OSA (obstructive sleep apnea)    Past Surgical History:  Procedure Laterality Date   ABDOMINAL HYSTERECTOMY     ANKLE FRACTURE SURGERY Right    BREAST BIOPSY Left    BREAST RECONSTRUCTION     CATARACT EXTRACTION Bilateral    CHOLECYSTECTOMY     COLONOSCOPY W/ POLYPECTOMY     ENDOVENOUS ABLATION SAPHENOUS VEIN W/ LASER Right 01/2020   [place]   ENDOVENOUS ABLATION SAPHENOUS VEIN W/ LASER Left 05/2020   [place]   LYMPH NODE BIOPSY Left    AXILLARY   MASTECTOMY Bilateral    THYROIDECTOMY     TUBAL LIGATION Bilateral    WISDOM TOOTH EXTRACTION     Patient Active Problem List   Diagnosis Date Noted   Cerebrovascular disease 10/12/2023   History of stroke 10/12/2023   Blurred vision 10/12/2023   Flank pain 10/12/2023   Acute ischemic stroke (HCC) 10/03/2023   Statin intolerance 08/17/2023   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   Prediabetes 08/17/2023   Rash 06/15/2023   Breast cancer in female [facility]) 06/06/2023   Hypercholesterolemia 05/08/2023   Age-related  osteoporosis with current pathological fracture 05/08/2023   DDD (degenerative disc disease), lumbar 05/08/2023   OSA (obstructive sleep apnea) 05/08/2023   Seasonal allergies 05/08/2023   Chronic venous insufficiency 05/08/2023   Moderate episode of recurrent major depressive disorder (HCC) 05/08/2023   GAD (generalized anxiety disorder) 05/08/2023   Closed fracture of right ankle 05/08/2023   BMI 29.0-29.9,adult 05/08/2023   History of breast cancer 05/08/2023   Hypothyroidism (acquired) 01/07/2023   Essential hypertension 01/07/2023   Age-related osteoporosis without current pathological fracture 10/06/2022   Hip pain 10/06/2022   COVID-19 08/20/2022   Strep throat 08/20/2022   Hyperlipidemia 06/25/2022   Genetic testing 02/04/2022    ONSET DATE: 10-03-23  REFERRING DIAG: I63.9 (ICD-10-CM) - Acute ischemic stroke (HCC)  THERAPY DIAG:  Muscle weakness (generalized)  Other abnormalities of gait and mobility  Unsteadiness on feet  Rationale for Evaluation and Treatment: Rehabilitation  SUBJECTIVE:  SUBJECTIVE STATEMENT: Pt ambulating into clinic today without use of RW for first time - pt states she has not used RW since after PT session on Monday this week.  Says the lightheadedness in her head is slowly improving Pt accompanied by:  husband, Haley Beasley  PERTINENT HISTORY: Per chart note "68 y.o. patient with history of breast cancer, anxiety, depression, GERD, hypertension, hyperlipidemia, hypothyroidism and sleep apnea was admitted with acute onset left-sided weakness.  Patient was given IV TNK after careful discussion about risk benefits and alternatives with patient.  She was kept in ICU and blood pressure was tightly controlled.  She showed rapid neurological improvement".  Pt admitted to [facility]  on 10-03-23 with Rt ischemic CVA; was discharged home on 10-05-23.  PAIN:  Are you having pain?  Pt reports she has a residual headache - rates it mild - 2/10 - is constant in occurrence - "will ease up but never goes away "  PRECAUTIONS: Fall  RED FLAGS: None   WEIGHT BEARING RESTRICTIONS: No  FALLS: Has patient fallen in last 6 months? Yes. Number of falls fell in May 2024 and fractured Rt foot and fell at mall due to shoes gripping carpet - sustained black eye and bloody nose  LIVING ENVIRONMENT: Lives with: lives with their spouse Lives in: House/apartment Stairs: Yes: External: 2 steps; none Has following equipment at home: Walker - 2 wheeled and Tour manager  PLOF: Independent; pt walked for exercise  PATIENT GOALS: "get back to were I was at before this happened"  OBJECTIVE:  Note: Objective measures were completed at Evaluation unless otherwise noted.  DIAGNOSTIC FINDINGS: MRI showed Rt thalamic CVA: Acute ischemic infarct:   Punctate infarct in the anterior right thalamus s/p TNK  COGNITION: Overall cognitive status: Within functional limits for tasks assessed   SENSATION: WFL  COORDINATION: WFL's LLE  POSTURE: No Significant postural limitations  LOWER EXTREMITY ROM:   WNL's bil. LE's   LOWER EXTREMITY MMT:    MMT Right Eval Left Eval  Hip flexion  3+  Hip extension  4-  Hip abduction  3+  Hip adduction    Hip internal rotation    Hip external rotation    Knee flexion  4  Knee extension  4  Ankle dorsiflexion  3+  Ankle plantarflexion  4-  Ankle inversion    Ankle eversion    (Blank rows = not tested)  BED MOBILITY:  Independent   TRANSFERS: Assistive device utilized: Environmental consultant - 2 wheeled  Sit to stand: Modified independence Stand to sit: Modified independence  STAIRS:  TBA  GAIT: Gait pattern: step through pattern; decreased head turns due to dizziness: slow speed, cautious gait pattern Distance walked: 100' Assistive device utilized:  Environmental consultant - 2 wheeled; no device used during eval Level of assistance: SBA Comments: did not use RW during eval  FUNCTIONAL TESTS:  Timed up and go (TUG): 22.03 secs with RW 10 meter walk test: 22.63 secs = 1.45 ft/sec without use of RW  Berg Balance Scale: 42/56   FGA completed 10-12-23   Turning = 8.31, 8.47 secs  PATIENT SURVEYS:  FOTO  - CVA LE - 59.9  TREATMENT DATE: 10-15-23   Gait:  Gait pattern: step through pattern; decreased head turns due to dizziness: slow speed, cautious gait pattern Distance walked: 115' x 2 reps Assistive device utilized: None Level of assistance: CGA to SBA Comments: did not bring RW to today's session Practiced walking fast/slow with abrupt stop and turn 180 degrees; tossed ball straight up approx. 67' while walking: tossed on Rt/Lt sides approx. 40' x 1 rep:  practiced walking backwards tossing & catching ball approx. 30' x 2 reps with CGA   NeuroRe-ed: Standing Balance: Surface: Airex Position: Feet Hip Width Apart Completed with: Eyes Open and Eyes Closed; Head Turns x 5 Reps and Head Nods x 5 Reps  Gave these exercises for HEP - feet apart with EO and EC on pillow      TherEx: updated HEP to include bridging exercises and hip flexion and abduction with use of green theraband; also hip flexion with knee flexed at 90 degrees Access Code: [email or web address] URL: [email or web address] Date: 10/16/2023 Prepared by: Maebelle Munroe  Exercises - Bridge  - 1 x daily - 7 x weekly - 1 sets - 10 reps - Marching Bridge  - 1 x daily - 7 x weekly - 1 sets - 10 reps - Bridge with knees apart/together  - 1 x daily - 7 x weekly - 1 sets - 10 reps - Supine Bridge with Leg Extension  - 1 x daily - 7 x weekly - 1 sets - 10 reps - Standing Hip Abduction with Theraband Resistance  - 1 x daily - 7 x weekly - 2 sets - 10 reps - Standing  Repeated Hip Flexion with Resistance  - 1 x daily - 7 x weekly - 2 sets - 10 reps - Hip and Knee Flexion with Anchored Resistance  - 1 x daily - 7 x weekly - 2 sets - 10 reps       Medbridge Access Code: [email or web address] URL: [email or web address] Date: 10/12/2023 Prepared by: Maebelle Munroe  Exercises - Supine Hip Flexion with Resistance  - 1 x daily - 7 x weekly - 2 sets - 10 reps - Supine Hip Flexion with Resistance Loop  - 1 x daily - 7 x weekly - 2 sets - 10 reps - Standing Hip Extension with Resistance  - 1 x daily - 7 x weekly - 2 sets - 10 reps - Clam with Resistance  - 1 x daily - 7 x weekly - 2 sets - 10 reps - Sidelying Hip Abduction with Resistance at Thighs  - 1 x daily - 7 x weekly - 2 sets - 10 reps - Marching with Resistance  - 1 x daily - 7 x weekly - 2 sets - 10 reps   Access Code: [email or web address] URL: [email or web address] Date: 10/08/2023 Prepared by: Maebelle Munroe  Exercises - Sit to Stand  - 1 x daily - 7 x weekly - 1 sets - 10 reps - Supine Active Straight Leg Raise  - 1 x daily - 7 x weekly - 3 sets - 10 reps - SIDELYING LEG LIFTS; ALSO DO CIRCLES CLOCKWISE & COUNTERCLOCKWISE  - 1 x daily - 7 x weekly - 3 sets - 10 reps - 3 sec hold - Standing Hip Extension with Counter Support  - 1 x daily - 7 x weekly - 3 sets - 10 reps - Standing Marching  - 1 x daily - 7 x weekly - 1 sets - 10 reps - Single Leg Stance  - 1 x daily -  7 x weekly - 1 sets - 2-3 reps - 10 sec hold - Romberg Stance  - 1 x daily - 7 x weekly - 1 sets - 1-2 reps - 30 sec hold  PATIENT EDUCATION: Education details: Medbridge HEP updated -[email or web address]; also instructed pt to amb. Next to counter in her kitchen with horizontal head turns and do standing on floor with feet together with EC Person educated: Patient and Spouse Education method: Explanation, Demonstration, and Handouts Education comprehension: verbalized understanding  HOME EXERCISE PROGRAM: Medbridge  [email or web address]  GOALS: Goals reviewed with patient? Yes   LONG TERM GOALS: Target date: 11-06-23  Improve Berg balance test score to >/= 50/56 to demo improved balance and to reduce fall risk. Baseline: 42/56 on 10-08-23 Goal status: INITIAL  2.  Improve TUG score to </= 14 secs without device to demo improved functional mobility. Baseline: 22.03 secs without use of RW Goal status: INITIAL  3.  Perform sit to stand without UE support from standard chair to demo improved LE strength. Baseline: bil. UE support used Goal status: INITIAL  4.   Perform FGA and set LTG as appropriate. Baseline:  Goal status: INITIAL  5.  Increase gait velocity to >/= 2.5 ft/sec without device for increased gait efficiency. Baseline: 22.63 secs = 1.45 ft/sec with RW Goal status: INITIAL  6.  Independent with HEP for balance and strengthening.   Baseline:  Goal status: INITIAL  ASSESSMENT:  CLINICAL IMPRESSION: PT session focused on gait training without device - pt amb. Into clinic without use of RW for first time today.  Session also focused on strengthening exercises for LLE and core with use of green theraband for PRE's and balance exercises to increase vestibular input in maintaining balance.  Updated HEP to include standing on foam with feet apart with EO and EC; pt had minimal postural sway with these exercises but no complete LOB.  Cont with POC.  OBJECTIVE IMPAIRMENTS: Abnormal gait, decreased balance, decreased strength, and dizziness.   ACTIVITY LIMITATIONS: carrying, standing, squatting, stairs, and locomotion level  PARTICIPATION LIMITATIONS: meal prep, cleaning, laundry, driving, shopping, and community activity  PERSONAL FACTORS: 1 comorbidity: h/o breast cancer, age-related osteoporosis  are also affecting patient's functional outcome.   REHAB POTENTIAL: Good  CLINICAL DECISION MAKING: Evolving/moderate complexity  EVALUATION COMPLEXITY: Moderate  PLAN:  PT FREQUENCY:  2x/week  PT DURATION: 4 weeks  PLANNED INTERVENTIONS: 97110-Therapeutic exercises, 97530- Therapeutic activity, 97112- Neuromuscular re-education, [phone]- Self Care, [id number]- Gait training, [phone]- Aquatic Therapy, and Patient/Family education  PLAN FOR NEXT SESSION: check updated HEP - balance on foam and PRE's with green theraband -continue balance/vestibular exercises - add standing on foam with feet together?; dynamic gait training   Jamill Wetmore, Donavan Burnet, PT 10/16/2023, 4:24 PM

## 2023-10-16 ENCOUNTER — Encounter: Payer: Self-pay | Admitting: Physical Therapy

## 2023-10-19 ENCOUNTER — Ambulatory Visit: Payer: Medicare HMO | Admitting: Student

## 2023-10-19 ENCOUNTER — Encounter: Payer: Self-pay | Admitting: Student

## 2023-10-19 ENCOUNTER — Ambulatory Visit: Payer: Medicare HMO | Admitting: Physical Therapy

## 2023-10-19 VITALS — BP 132/84 | HR 70 | Temp 97.8°F | Resp 18 | Ht 68.0 in | Wt 187.4 lb

## 2023-10-19 VITALS — BP 124/87

## 2023-10-19 DIAGNOSIS — M6281 Muscle weakness (generalized): Secondary | ICD-10-CM | POA: Diagnosis not present

## 2023-10-19 DIAGNOSIS — R2689 Other abnormalities of gait and mobility: Secondary | ICD-10-CM | POA: Diagnosis not present

## 2023-10-19 DIAGNOSIS — J320 Chronic maxillary sinusitis: Secondary | ICD-10-CM

## 2023-10-19 DIAGNOSIS — I639 Cerebral infarction, unspecified: Secondary | ICD-10-CM | POA: Diagnosis not present

## 2023-10-19 DIAGNOSIS — R2681 Unsteadiness on feet: Secondary | ICD-10-CM

## 2023-10-19 DIAGNOSIS — I69354 Hemiplegia and hemiparesis following cerebral infarction affecting left non-dominant side: Secondary | ICD-10-CM | POA: Diagnosis not present

## 2023-10-19 LAB — POC COVID19 BINAXNOW: SARS Coronavirus 2 Ag: NEGATIVE

## 2023-10-19 MED ORDER — AZITHROMYCIN 250 MG PO TABS: ORAL_TABLET | ORAL | 0 refills | Status: AC

## 2023-10-19 NOTE — Patient Instructions (Addendum)
Saline nasal rinses and humidified air.

## 2023-10-19 NOTE — Therapy (Unsigned)
OUTPATIENT PHYSICAL THERAPY NEURO TREATMENT NOTE   Patient Name: Haley Beasley MRN: [id number] DOB:07/26/1956, 68 y.o., female Today's Date: 10/20/2023   PCP: Crist Fat, MD REFERRING PROVIDER: Ernestina Columbia, Lennox Solders, NP  END OF SESSION:  PT End of Session - 10/20/23 1443     Visit Number 4    Number of Visits 9    Date for PT Re-Evaluation 11/06/23    Authorization Type [employer]    Authorization Time Period 10-08-23 - 11-20-23    Authorization - Visit Number 4    Authorization - Number of Visits 9    PT Start Time 1103    PT Stop Time 1151    PT Time Calculation (min) 48 min    Equipment Utilized During Treatment Gait belt    Activity Tolerance Patient tolerated treatment well    Behavior During Therapy WFL for tasks assessed/performed                Past Medical History:  Diagnosis Date   Anxiety    Breast cancer (HCC)    Depression    GERD (gastroesophageal reflux disease)    Hyperlipidemia    Hypertension    Hypothyroidism    Memory loss    OSA (obstructive sleep apnea)    Past Surgical History:  Procedure Laterality Date   ABDOMINAL HYSTERECTOMY     ANKLE FRACTURE SURGERY Right    BREAST BIOPSY Left    BREAST RECONSTRUCTION     CATARACT EXTRACTION Bilateral    CHOLECYSTECTOMY     COLONOSCOPY W/ POLYPECTOMY     ENDOVENOUS ABLATION SAPHENOUS VEIN W/ LASER Right 01/2020   [place]   ENDOVENOUS ABLATION SAPHENOUS VEIN W/ LASER Left 05/2020   [place]   LYMPH NODE BIOPSY Left    AXILLARY   MASTECTOMY Bilateral    THYROIDECTOMY     TUBAL LIGATION Bilateral    WISDOM TOOTH EXTRACTION     Patient Active Problem List   Diagnosis Date Noted   Cerebrovascular disease 10/12/2023   History of stroke 10/12/2023   Blurred vision 10/12/2023   Flank pain 10/12/2023   Acute ischemic stroke (HCC) 10/03/2023   Statin intolerance 08/17/2023   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   Prediabetes 08/17/2023   Rash 06/15/2023   Breast cancer in female  [facility]) 06/06/2023   Hypercholesterolemia 05/08/2023   Age-related osteoporosis with current pathological fracture 05/08/2023   DDD (degenerative disc disease), lumbar 05/08/2023   OSA (obstructive sleep apnea) 05/08/2023   Seasonal allergies 05/08/2023   Chronic venous insufficiency 05/08/2023   Moderate episode of recurrent major depressive disorder (HCC) 05/08/2023   GAD (generalized anxiety disorder) 05/08/2023   Closed fracture of right ankle 05/08/2023   BMI 29.0-29.9,adult 05/08/2023   History of breast cancer 05/08/2023   Hypothyroidism (acquired) 01/07/2023   Essential hypertension 01/07/2023   Age-related osteoporosis without current pathological fracture 10/06/2022   Hip pain 10/06/2022   COVID-19 08/20/2022   Strep throat 08/20/2022   Hyperlipidemia 06/25/2022   Genetic testing 02/04/2022    ONSET DATE: 10-03-23  REFERRING DIAG: I63.9 (ICD-10-CM) - Acute ischemic stroke (HCC)  THERAPY DIAG:  Muscle weakness (generalized)  Other abnormalities of gait and mobility  Unsteadiness on feet  Rationale for Evaluation and Treatment: Rehabilitation  SUBJECTIVE:  SUBJECTIVE STATEMENT: Pt reports she is doing well other than feeling as if she may have sinus infection - has appt with her PCP this afternoon.  Continues to walk without use of RW.  Went to [place] this weekend with her husband to visit a family member in SNF - did well but was tired when she got back home Pt accompanied by:  husband, [name]  PERTINENT HISTORY: Per chart note "68 y.o. patient with history of breast cancer, anxiety, depression, GERD, hypertension, hyperlipidemia, hypothyroidism and sleep apnea was admitted with acute onset left-sided weakness.  Patient was given IV TNK after careful discussion about risk benefits  and alternatives with patient.  She was kept in ICU and blood pressure was tightly controlled.  She showed rapid neurological improvement".  Pt admitted to [facility] on 10-03-23 with Rt ischemic CVA; was discharged home on 10-05-23.  PAIN:  Are you having pain?  Pt reports she has a residual headache - rates it mild - 2/10 - is constant in occurrence - "will ease up but never goes away "  PRECAUTIONS: Fall  RED FLAGS: None   WEIGHT BEARING RESTRICTIONS: No  FALLS: Has patient fallen in last 6 months? Yes. Number of falls fell in May 2024 and fractured Rt foot and fell at mall due to shoes gripping carpet - sustained black eye and bloody nose  LIVING ENVIRONMENT: Lives with: lives with their spouse Lives in: House/apartment Stairs: Yes: External: 2 steps; none Has following equipment at home: Walker - 2 wheeled and Tour manager  PLOF: Independent; pt walked for exercise  PATIENT GOALS: "get back to were I was at before this happened"  OBJECTIVE:  Note: Objective measures were completed at Evaluation unless otherwise noted.  DIAGNOSTIC FINDINGS: MRI showed Rt thalamic CVA: Acute ischemic infarct:   Punctate infarct in the anterior right thalamus s/p TNK  COGNITION: Overall cognitive status: Within functional limits for tasks assessed   SENSATION: WFL  COORDINATION: WFL's LLE  POSTURE: No Significant postural limitations  LOWER EXTREMITY ROM:   WNL's bil. LE's   LOWER EXTREMITY MMT:    MMT Right Eval Left Eval  Hip flexion  3+  Hip extension  4-  Hip abduction  3+  Hip adduction    Hip internal rotation    Hip external rotation    Knee flexion  4  Knee extension  4  Ankle dorsiflexion  3+  Ankle plantarflexion  4-  Ankle inversion    Ankle eversion    (Blank rows = not tested)  BED MOBILITY:  Independent   TRANSFERS: Assistive device utilized: Environmental consultant - 2 wheeled  Sit to stand: Modified independence Stand to sit: Modified independence  STAIRS:   TBA  GAIT: Gait pattern: step through pattern; decreased head turns due to dizziness: slow speed, cautious gait pattern Distance walked: 100' Assistive device utilized: Environmental consultant - 2 wheeled; no device used during eval Level of assistance: SBA Comments: did not use RW during eval  FUNCTIONAL TESTS:  Timed up and go (TUG): 22.03 secs with RW 10 meter walk test: 22.63 secs = 1.45 ft/sec without use of RW  Berg Balance Scale: 42/56   FGA completed 10-12-23   Turning = 8.31, 8.47 secs  PATIENT SURVEYS:  FOTO  - CVA LE - 59.9  TREATMENT DATE: 10-19-23   TherEx: Sit to stand 5 reps with feet on floor with SBA; progressed to feet on Airex from high/low mat table for increased height for increased ease - pt performed 5 reps with CGA due to fear of falling forward  Lt hip stretch - piriformis stretch in seated position - 15 sec hold x 2 reps  Pt performed lifting LLE up to 2nd step 10 reps - 3# weight placed on leg for strengthening  Pt performed lifting LLE over yoga block in seated position 10 reps - to increase ease with lifting Lt leg up and over into passenger seat area of car    Gait:  Gait pattern: step through pattern; decreased head turns due to dizziness: slow speed, cautious gait pattern Distance walked: 115' x 3 reps Assistive device utilized: None Level of assistance: CGA to SBA Comments: did not bring RW to today's session Practiced walking fast/slow with abrupt stop and turn 180 degrees; tossed ball straight up approx. 56' while walking: tossed on Rt/Lt sides approx. 40' x 2 reps:  practiced walking backwards tossing & catching ball straight up and then on Rt/Lt sides with backwards ambulation 35' x 2 reps with CGA  Performed resisted ambulation with sports cord 115' x 2 reps (230') with moderate perturbations for challenge with dynamic gait and  balance; pt had only 1 occurrence of stagger step but regained balance independently   NeuroRe-ed: Standing Balance: Surface: Airex Position: Feet Hip Width Apart Completed with: Eyes Open and Eyes Closed; Head Turns x 5 Reps and Head Nods x 5 Reps    Pt performed tandem stance 30 secs at counter with UE support prn; then performed tandem walking 2 reps along counter with UE support prn   LLE SLS - 10 sec hold    updated HEP to include bridging exercises and hip flexion and abduction with use of green theraband; also hip flexion with knee flexed at 90 degrees Access Code: [email or web address] URL: [email or web address] Date: 10/16/2023 Prepared by: Maebelle Munroe  Exercises - Bridge  - 1 x daily - 7 x weekly - 1 sets - 10 reps - Marching Bridge  - 1 x daily - 7 x weekly - 1 sets - 10 reps - Bridge with knees apart/together  - 1 x daily - 7 x weekly - 1 sets - 10 reps - Supine Bridge with Leg Extension  - 1 x daily - 7 x weekly - 1 sets - 10 reps - Standing Hip Abduction with Theraband Resistance  - 1 x daily - 7 x weekly - 2 sets - 10 reps - Standing Repeated Hip Flexion with Resistance  - 1 x daily - 7 x weekly - 2 sets - 10 reps - Hip and Knee Flexion with Anchored Resistance  - 1 x daily - 7 x weekly - 2 sets - 10 reps       Medbridge Access Code: [email or web address] URL: [email or web address] Date: 10/12/2023 Prepared by: Maebelle Munroe  Exercises - Supine Hip Flexion with Resistance  - 1 x daily - 7 x weekly - 2 sets - 10 reps - Supine Hip Flexion with Resistance Loop  - 1 x daily - 7 x weekly - 2 sets - 10 reps - Standing Hip Extension with Resistance  - 1 x daily - 7 x weekly - 2 sets - 10 reps - Clam with Resistance  - 1 x daily - 7 x weekly - 2 sets - 10 reps - Sidelying Hip Abduction with Resistance  at Thighs  - 1 x daily - 7 x weekly - 2 sets - 10 reps - Marching with Resistance  - 1 x daily - 7 x weekly - 2 sets - 10 reps   Access Code: [email or web address] URL:  [email or web address] Date: 10/08/2023 Prepared by: Maebelle Munroe  Exercises - Sit to Stand  - 1 x daily - 7 x weekly - 1 sets - 10 reps - Supine Active Straight Leg Raise  - 1 x daily - 7 x weekly - 3 sets - 10 reps - SIDELYING LEG LIFTS; ALSO DO CIRCLES CLOCKWISE & COUNTERCLOCKWISE  - 1 x daily - 7 x weekly - 3 sets - 10 reps - 3 sec hold - Standing Hip Extension with Counter Support  - 1 x daily - 7 x weekly - 3 sets - 10 reps - Standing Marching  - 1 x daily - 7 x weekly - 1 sets - 10 reps - Single Leg Stance  - 1 x daily - 7 x weekly - 1 sets - 2-3 reps - 10 sec hold - Romberg Stance  - 1 x daily - 7 x weekly - 1 sets - 1-2 reps - 30 sec hold  PATIENT EDUCATION: Education details: Medbridge HEP updated -[email or web address]; also instructed pt to amb. Next to counter in her kitchen with horizontal head turns and do standing on floor with feet together with EC Person educated: Patient and Spouse Education method: Explanation, Demonstration, and Handouts Education comprehension: verbalized understanding  HOME EXERCISE PROGRAM: Medbridge [email or web address]  GOALS: Goals reviewed with patient? Yes   LONG TERM GOALS: Target date: 11-06-23  Improve Berg balance test score to >/= 50/56 to demo improved balance and to reduce fall risk. Baseline: 42/56 on 10-08-23 Goal status: INITIAL  2.  Improve TUG score to </= 14 secs without device to demo improved functional mobility. Baseline: 22.03 secs without use of RW Goal status: INITIAL  3.  Perform sit to stand without UE support from standard chair to demo improved LE strength. Baseline: bil. UE support used Goal status: INITIAL  4.   Perform FGA and set LTG as appropriate. Baseline:  Goal status: INITIAL  5.  Increase gait velocity to >/= 2.5 ft/sec without device for increased gait efficiency. Baseline: 22.63 secs = 1.45 ft/sec with RW Goal status: INITIAL  6.  Independent with HEP for balance and strengthening.   Baseline:  Goal  status: INITIAL  ASSESSMENT:  CLINICAL IMPRESSION: PT session focused on dynamic balance and gait training without use of device.  Pt did well with resisted ambulation with use of sports cord with moderate perturbations given.  Pt fatigued with lifting LLE with 3# over yoga block in seated position, as this was the last activity performed in today's session.  Pt had moderate difficulty with final 3 reps but was able to lift up and over without use of hands for assist.  Pt is progressing very well towards goals.  Cont with POC.  OBJECTIVE IMPAIRMENTS: Abnormal gait, decreased balance, decreased strength, and dizziness.   ACTIVITY LIMITATIONS: carrying, standing, squatting, stairs, and locomotion level  PARTICIPATION LIMITATIONS: meal prep, cleaning, laundry, driving, shopping, and community activity  PERSONAL FACTORS: 1 comorbidity: h/o breast cancer, age-related osteoporosis  are also affecting patient's functional outcome.   REHAB POTENTIAL: Good  CLINICAL DECISION MAKING: Evolving/moderate complexity  EVALUATION COMPLEXITY: Moderate  PLAN:  PT FREQUENCY: 2x/week  PT DURATION: 4 weeks  PLANNED INTERVENTIONS: 97110-Therapeutic exercises, 97530- Therapeutic activity, [id number]- Neuromuscular re-education, 97535- Self Care, 16109-  Gait training, 16109- Aquatic Therapy, and Patient/Family education  PLAN FOR NEXT SESSION:     check LTG's - plan to decrease frequency -- continue balance/vestibular exercises - add standing on foam with feet together?; dynamic gait training   Rubel Heckard, Donavan Burnet, PT 10/20/2023, 2:45 PM

## 2023-10-19 NOTE — Progress Notes (Signed)
   Acute Office Visit  Subjective:     Patient ID: Haley Beasley, female    DOB: Oct 05, 1955, 68 y.o.   MRN: [id number]  Chief Complaint  Patient presents with   Facial Pain    X 2 days, Sinus pressure, pnd, irritated throat, yellowish, greenish nasal discharge with some blood in it, no fever, no cough, denies sob. Using Flonase.    HPI Patient is in today for complaints of frontal and maxillary pressure since yesterday along with green yellow nasal discharge. When blowing her nose she notes strings of blood on the tissue. She has had post nasal drip that has not been relieved by Flonase. Theres no cough, fever, chills, and no ear pain or discharge. She    Review of Systems  Constitutional: Negative.   HENT:  Positive for congestion, sinus pain and sore throat.   Eyes: Negative.   Respiratory: Negative.    Cardiovascular: Negative.   Gastrointestinal: Negative.   Genitourinary: Negative.   Musculoskeletal: Negative.   Skin: Negative.   Neurological: Negative.   Psychiatric/Behavioral: Negative.          Objective:    BP 132/84 (BP Location: Right Arm, Patient Position: Sitting, Cuff Size: Normal)   Pulse 70   Temp 97.8 F (36.6 C) (Temporal)   Resp 18   Ht 5\' 8"  (1.727 m)   Wt 187 lb 6 oz (85 kg)   SpO2 95%   BMI 28.49 kg/m  {Vitals History (Optional):23777}  Physical Exam  Results for orders placed or performed in visit on 10/19/23  POC COVID-19 BinaxNow  Result Value Ref Range   SARS Coronavirus 2 Ag Negative Negative        Assessment & Plan:   Problem List Items Addressed This Visit   None Visit Diagnoses       Maxillary sinusitis, unspecified chronicity    -  Primary   Relevant Medications   azithromycin (ZITHROMAX Z-PAK) 250 MG tablet   Other Relevant Orders   POC COVID-19 BinaxNow (Completed)       Meds ordered this encounter  Medications   azithromycin (ZITHROMAX Z-PAK) 250 MG tablet    Sig: Take 2 tablets (500 mg) on  Day 1,   followed by 1 tablet (250 mg) once daily on Days 2 through 5.    Dispense:  6 each    Refill:  0    Return if symptoms worsen or fail to improve.  Edwena Blow, NP

## 2023-10-20 ENCOUNTER — Encounter: Payer: Self-pay | Admitting: Physical Therapy

## 2023-10-20 DIAGNOSIS — J320 Chronic maxillary sinusitis: Secondary | ICD-10-CM | POA: Insufficient documentation

## 2023-10-20 NOTE — Assessment & Plan Note (Signed)
I am sending Z-Pak for symptoms of sinusitis. Covid test was negative. We discussed other supportive care measures. If s/s don't resolve she will notify the office.

## 2023-10-22 ENCOUNTER — Encounter: Payer: Self-pay | Admitting: Physical Therapy

## 2023-10-22 ENCOUNTER — Ambulatory Visit: Payer: Medicare HMO | Admitting: Physical Therapy

## 2023-10-22 DIAGNOSIS — I639 Cerebral infarction, unspecified: Secondary | ICD-10-CM | POA: Diagnosis not present

## 2023-10-22 DIAGNOSIS — R2681 Unsteadiness on feet: Secondary | ICD-10-CM

## 2023-10-22 DIAGNOSIS — M6281 Muscle weakness (generalized): Secondary | ICD-10-CM | POA: Diagnosis not present

## 2023-10-22 DIAGNOSIS — R2689 Other abnormalities of gait and mobility: Secondary | ICD-10-CM | POA: Diagnosis not present

## 2023-10-22 DIAGNOSIS — I69354 Hemiplegia and hemiparesis following cerebral infarction affecting left non-dominant side: Secondary | ICD-10-CM | POA: Diagnosis not present

## 2023-10-22 NOTE — Therapy (Signed)
OUTPATIENT PHYSICAL THERAPY NEURO TREATMENT NOTE/DISCHARGE SUMMARY   Patient Name: MALAIA BUCHTA MRN: [id number] DOB:01-24-1956, 68 y.o., female Today's Date: 10/22/2023   PCP: Crist Fat, MD REFERRING PROVIDER: Ernestina Columbia, Lennox Solders, NP  END OF SESSION:  PT End of Session - 10/22/23 2052     Visit Number 5    Number of Visits 9    Date for PT Re-Evaluation 11/06/23    Authorization Type [employer]    Authorization Time Period 10-08-23 - 11-20-23    Authorization - Visit Number 5    Authorization - Number of Visits 9    PT Start Time 0932    PT Stop Time 1017    PT Time Calculation (min) 45 min    Equipment Utilized During Treatment Gait belt    Activity Tolerance Patient tolerated treatment well    Behavior During Therapy WFL for tasks assessed/performed                 Past Medical History:  Diagnosis Date   Anxiety    Breast cancer (HCC)    Depression    GERD (gastroesophageal reflux disease)    Hyperlipidemia    Hypertension    Hypothyroidism    Memory loss    OSA (obstructive sleep apnea)    Past Surgical History:  Procedure Laterality Date   ABDOMINAL HYSTERECTOMY     ANKLE FRACTURE SURGERY Right    BREAST BIOPSY Left    BREAST RECONSTRUCTION     CATARACT EXTRACTION Bilateral    CHOLECYSTECTOMY     COLONOSCOPY W/ POLYPECTOMY     ENDOVENOUS ABLATION SAPHENOUS VEIN W/ LASER Right 01/2020   [place]   ENDOVENOUS ABLATION SAPHENOUS VEIN W/ LASER Left 05/2020   [place]   LYMPH NODE BIOPSY Left    AXILLARY   MASTECTOMY Bilateral    THYROIDECTOMY     TUBAL LIGATION Bilateral    WISDOM TOOTH EXTRACTION     Patient Active Problem List   Diagnosis Date Noted   Maxillary sinusitis 10/20/2023   Cerebrovascular disease 10/12/2023   History of stroke 10/12/2023   Blurred vision 10/12/2023   Flank pain 10/12/2023   Acute ischemic stroke (HCC) 10/03/2023   Statin intolerance 08/17/2023   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   Prediabetes  08/17/2023   Rash 06/15/2023   Breast cancer in female [facility]) 06/06/2023   Hypercholesterolemia 05/08/2023   Age-related osteoporosis with current pathological fracture 05/08/2023   DDD (degenerative disc disease), lumbar 05/08/2023   OSA (obstructive sleep apnea) 05/08/2023   Seasonal allergies 05/08/2023   Chronic venous insufficiency 05/08/2023   Moderate episode of recurrent major depressive disorder (HCC) 05/08/2023   GAD (generalized anxiety disorder) 05/08/2023   Closed fracture of right ankle 05/08/2023   BMI 29.0-29.9,adult 05/08/2023   History of breast cancer 05/08/2023   Hypothyroidism (acquired) 01/07/2023   Essential hypertension 01/07/2023   Age-related osteoporosis without current pathological fracture 10/06/2022   Hip pain 10/06/2022   COVID-19 08/20/2022   Strep throat 08/20/2022   Hyperlipidemia 06/25/2022   Genetic testing 02/04/2022    ONSET DATE: 10-03-23  REFERRING DIAG: I63.9 (ICD-10-CM) - Acute ischemic stroke (HCC)  THERAPY DIAG:  Muscle weakness (generalized)  Other abnormalities of gait and mobility  Unsteadiness on feet  Rationale for Evaluation and Treatment: Rehabilitation  SUBJECTIVE:  SUBJECTIVE STATEMENT: Pt reports she is feeling well -  Pt accompanied by:  husband, [name]  PERTINENT HISTORY: Per chart note "68 y.o. patient with history of breast cancer, anxiety, depression, GERD, hypertension, hyperlipidemia, hypothyroidism and sleep apnea was admitted with acute onset left-sided weakness.  Patient was given IV TNK after careful discussion about risk benefits and alternatives with patient.  She was kept in ICU and blood pressure was tightly controlled.  She showed rapid neurological improvement".  Pt admitted to [facility] on 10-03-23 with Rt ischemic CVA; was  discharged home on 10-05-23.  PAIN:  Are you having pain?  Pt reports she has a residual headache - rates it mild - 2/10 - is constant in occurrence - "will ease up but never goes away "  PRECAUTIONS: Fall  RED FLAGS: None   WEIGHT BEARING RESTRICTIONS: No  FALLS: Has patient fallen in last 6 months? Yes. Number of falls fell in May 2024 and fractured Rt foot and fell at mall due to shoes gripping carpet - sustained black eye and bloody nose  LIVING ENVIRONMENT: Lives with: lives with their spouse Lives in: House/apartment Stairs: Yes: External: 2 steps; none Has following equipment at home: Walker - 2 wheeled and Tour manager  PLOF: Independent; pt walked for exercise  PATIENT GOALS: "get back to were I was at before this happened"  OBJECTIVE:  Note: Objective measures were completed at Evaluation unless otherwise noted.  DIAGNOSTIC FINDINGS: MRI showed Rt thalamic CVA: Acute ischemic infarct:   Punctate infarct in the anterior right thalamus s/p TNK  COGNITION: Overall cognitive status: Within functional limits for tasks assessed   SENSATION: WFL  COORDINATION: WFL's LLE  POSTURE: No Significant postural limitations  LOWER EXTREMITY ROM:   WNL's bil. LE's   LOWER EXTREMITY MMT:    MMT Right Eval Left Eval  Hip flexion  3+  Hip extension  4-  Hip abduction  3+  Hip adduction    Hip internal rotation    Hip external rotation    Knee flexion  4  Knee extension  4  Ankle dorsiflexion  3+  Ankle plantarflexion  4-  Ankle inversion    Ankle eversion    (Blank rows = not tested)  BED MOBILITY:  Independent   TRANSFERS: Assistive device utilized: Environmental consultant - 2 wheeled  Sit to stand: Modified independence Stand to sit: Modified independence  STAIRS:  TBA  GAIT: Gait pattern: step through pattern; decreased head turns due to dizziness: slow speed, cautious gait pattern Distance walked: 100' Assistive device utilized: Environmental consultant - 2 wheeled; no device used  during eval Level of assistance: SBA Comments: did not use RW during eval  FUNCTIONAL TESTS:  Timed up and go (TUG): 22.03 secs with RW 10 meter walk test: 22.63 secs = 1.45 ft/sec without use of RW  Berg Balance Scale: 42/56   TREATMENT DATE:  10-22-23  NeuroRe-ed:  TUG score 8.28 secs without device   [facility] PT Assessment - 10/22/23 0001       Berg Balance Test   Sit to Stand Able to stand without using hands and stabilize independently    Standing Unsupported Able to stand safely 2 minutes    Sitting with Back Unsupported but Feet Supported on Floor or Stool Able to sit safely and securely 2 minutes    Stand to Sit Sits safely with minimal use of hands    Transfers Able to transfer safely, minor use of hands    Standing Unsupported with Eyes Closed  Able to stand 10 seconds safely    Standing Unsupported with Feet Together Able to place feet together independently and stand 1 minute safely    From Standing, Reach Forward with Outstretched Arm Can reach confidently >25 cm (10")    From Standing Position, Pick up Object from Floor Able to pick up shoe safely and easily    From Standing Position, Turn to Look Behind Over each Shoulder Looks behind from both sides and weight shifts well    Turn 360 Degrees Able to turn 360 degrees safely in 4 seconds or less    Standing Unsupported, Alternately Place Feet on Step/Stool Able to stand independently and safely and complete 8 steps in 20 seconds    Standing Unsupported, One Foot in Front Able to place foot tandem independently and hold 30 seconds    Standing on One Leg Able to lift leg independently and hold > 10 seconds    Total Score 56      Functional Gait  Assessment   Gait Level Surface Walks 20 ft in less than 5.5 sec, no assistive devices, good speed, no evidence for imbalance, normal gait pattern, deviates no more than 6 in outside of the 12 in walkway width.    Change in Gait Speed Able to smoothly change walking speed without  loss of balance or gait deviation. Deviate no more than 6 in outside of the 12 in walkway width.    Gait with Horizontal Head Turns Performs head turns smoothly with no change in gait. Deviates no more than 6 in outside 12 in walkway width    Gait with Vertical Head Turns Performs head turns with no change in gait. Deviates no more than 6 in outside 12 in walkway width.    Gait and Pivot Turn Pivot turns safely within 3 sec and stops quickly with no loss of balance.    Step Over Obstacle Is able to step over 2 stacked shoe boxes taped together (9 in total height) without changing gait speed. No evidence of imbalance.    Gait with Narrow Base of Support Is able to ambulate for 10 steps heel to toe with no staggering.    Gait with Eyes Closed Walks 20 ft, no assistive devices, good speed, no evidence of imbalance, normal gait pattern, deviates no more than 6 in outside 12 in walkway width. Ambulates 20 ft in less than 7 sec.    Ambulating Backwards Walks 20 ft, no assistive devices, good speed, no evidence for imbalance, normal gait    Steps Alternating feet, no rail.    Total Score 30            Gait velocity:   8.06 secs = 4.07 secs without device   PATIENT SURVEYS:  FOTO  - CVA LE - 59.9       Gait:  Gait pattern: step through pattern; decreased head turns due to dizziness: slow speed, cautious gait pattern Distance walked: 115' x 3 reps Assistive device utilized: None Level of assistance: CGA to SBA Comments: did not bring RW to today's session Practiced walking fast/slow with abrupt stop and turn 180 degrees; tossed ball straight up approx. 23' while walking: tossed on Rt/Lt sides approx. 40' x 2 reps:  practiced walking backwards tossing & catching ball straight up and then on Rt/Lt sides with backwards ambulation 35' x 2 reps with CGA    updated HEP to include bridging exercises and hip flexion and abduction with use of green theraband; also hip  flexion with knee flexed at 90  degrees Access Code: [email or web address] URL: [email or web address] Date: 10/16/2023 Prepared by: Maebelle Munroe  Exercises - Bridge  - 1 x daily - 7 x weekly - 1 sets - 10 reps - Marching Bridge  - 1 x daily - 7 x weekly - 1 sets - 10 reps - Bridge with knees apart/together  - 1 x daily - 7 x weekly - 1 sets - 10 reps - Supine Bridge with Leg Extension  - 1 x daily - 7 x weekly - 1 sets - 10 reps - Standing Hip Abduction with Theraband Resistance  - 1 x daily - 7 x weekly - 2 sets - 10 reps - Standing Repeated Hip Flexion with Resistance  - 1 x daily - 7 x weekly - 2 sets - 10 reps - Hip and Knee Flexion with Anchored Resistance  - 1 x daily - 7 x weekly - 2 sets - 10 reps       Medbridge Access Code: [email or web address] URL: [email or web address] Date: 10/12/2023 Prepared by: Maebelle Munroe  Exercises - Supine Hip Flexion with Resistance  - 1 x daily - 7 x weekly - 2 sets - 10 reps - Supine Hip Flexion with Resistance Loop  - 1 x daily - 7 x weekly - 2 sets - 10 reps - Standing Hip Extension with Resistance  - 1 x daily - 7 x weekly - 2 sets - 10 reps - Clam with Resistance  - 1 x daily - 7 x weekly - 2 sets - 10 reps - Sidelying Hip Abduction with Resistance at Thighs  - 1 x daily - 7 x weekly - 2 sets - 10 reps - Marching with Resistance  - 1 x daily - 7 x weekly - 2 sets - 10 reps   Access Code: [email or web address] URL: [email or web address] Date: 10/08/2023 Prepared by: Maebelle Munroe  Exercises - Sit to Stand  - 1 x daily - 7 x weekly - 1 sets - 10 reps - Supine Active Straight Leg Raise  - 1 x daily - 7 x weekly - 3 sets - 10 reps - SIDELYING LEG LIFTS; ALSO DO CIRCLES CLOCKWISE & COUNTERCLOCKWISE  - 1 x daily - 7 x weekly - 3 sets - 10 reps - 3 sec hold - Standing Hip Extension with Counter Support  - 1 x daily - 7 x weekly - 3 sets - 10 reps - Standing Marching  - 1 x daily - 7 x weekly - 1 sets - 10 reps - Single Leg Stance  - 1 x daily - 7 x weekly - 1 sets -  2-3 reps - 10 sec hold - Romberg Stance  - 1 x daily - 7 x weekly - 1 sets - 1-2 reps - 30 sec hold  PATIENT EDUCATION: Education details: Medbridge HEP updated -[email or web address]; also instructed pt to amb. Next to counter in her kitchen with horizontal head turns and do standing on floor with feet together with EC Person educated: Patient and Spouse Education method: Explanation, Demonstration, and Handouts Education comprehension: verbalized understanding  HOME EXERCISE PROGRAM: Medbridge [email or web address]  GOALS: Goals reviewed with patient? Yes   LONG TERM GOALS: Target date: 11-06-23  Improve Berg balance test score to >/= 50/56 to demo improved balance and to reduce fall risk. Baseline: 42/56 on 10-08-23; 56/56 on 10-22-23 Goal status: MET 10-22-23  2.  Improve TUG score to </= 14 secs without device to demo improved functional mobility. Baseline: 22.03 secs without use  of RW;  8.28 secs without device Goal status: MET 10-22-23  3.  Perform sit to stand without UE support from standard chair to demo improved LE strength. Baseline: bil. UE support used Goal status: MET 10-22-23  4.   Perform FGA and set LTG as appropriate. Baseline: 16/30 initial:  30/30 on 10-22-23 Goal status: MET 10-22-23  5.  Increase gait velocity to >/= 2.5 ft/sec without device for increased gait efficiency. Baseline: 22.63 secs = 1.45 ft/sec with RW;   8.06 secs = 4.07 secs - 10-22-23 Goal status: MET 10-22-23  6.  Independent with HEP for balance and strengthening.   Baseline:  Goal status: MET 10-22-23  ASSESSMENT:  CLINICAL IMPRESSION: PT session focused on assessment of LTG's for discharge as pt has made excellent progress in just a few weeks.  Pt has met LTG's #1-6 with Berg score now 56/56 and FGA score 30/30.  Pt is discharged due to all goals met; pt states she feels she is ready for D/C and is pleased with her progress.    OBJECTIVE IMPAIRMENTS: Abnormal gait, decreased balance, decreased strength, and  dizziness.   ACTIVITY LIMITATIONS: carrying, standing, squatting, stairs, and locomotion level  PARTICIPATION LIMITATIONS: meal prep, cleaning, laundry, driving, shopping, and community activity  PERSONAL FACTORS: 1 comorbidity: h/o breast cancer, age-related osteoporosis  are also affecting patient's functional outcome.   REHAB POTENTIAL: Good  CLINICAL DECISION MAKING: Evolving/moderate complexity  EVALUATION COMPLEXITY: Moderate  PLAN:  PT FREQUENCY: 2x/week  PT DURATION: 4 weeks  PLANNED INTERVENTIONS: 97110-Therapeutic exercises, 97530- Therapeutic activity, 97112- Neuromuscular re-education, [id number]- Self Care, [id number]- Gait training, [phone]- Aquatic Therapy, and Patient/Family education  PLAN FOR NEXT SESSION:     N/A - D/C on 10-22-23   PHYSICAL THERAPY DISCHARGE SUMMARY  Visits from Start of Care: 5  Current functional level related to goals / functional outcomes: See above for progress towards goals - all LTG's met   Remaining deficits:   Minimal decreased vestibular input in maintaining balance but this has significantly improved since initial eval; gait and balance are WNL's   Education / Equipment: Pt has been instructed in HEP for balance and strengthening exercises - pt has made significant progress with compliance with this HEP   Patient agrees to discharge. Patient goals were met. Patient is being discharged due to meeting the stated rehab goals.    Kary Kos, PT 10/22/2023, 8:55 PM

## 2023-10-23 ENCOUNTER — Ambulatory Visit: Payer: Medicare HMO | Admitting: Cardiology

## 2023-10-24 ENCOUNTER — Other Ambulatory Visit: Payer: Self-pay | Admitting: Internal Medicine

## 2023-10-24 DIAGNOSIS — I2510 Atherosclerotic heart disease of native coronary artery without angina pectoris: Secondary | ICD-10-CM

## 2023-10-24 DIAGNOSIS — E785 Hyperlipidemia, unspecified: Secondary | ICD-10-CM

## 2023-10-26 ENCOUNTER — Ambulatory Visit: Payer: Medicare HMO | Admitting: Physical Therapy

## 2023-10-29 ENCOUNTER — Ambulatory Visit: Payer: Medicare HMO | Admitting: Physical Therapy

## 2023-11-02 ENCOUNTER — Ambulatory Visit: Payer: Medicare HMO | Admitting: Physical Therapy

## 2023-11-04 DIAGNOSIS — I10 Essential (primary) hypertension: Secondary | ICD-10-CM

## 2023-11-04 DIAGNOSIS — Z8673 Personal history of transient ischemic attack (TIA), and cerebral infarction without residual deficits: Secondary | ICD-10-CM

## 2023-11-04 DIAGNOSIS — T466X5A Adverse effect of antihyperlipidemic and antiarteriosclerotic drugs, initial encounter: Secondary | ICD-10-CM

## 2023-11-04 DIAGNOSIS — E7800 Pure hypercholesterolemia, unspecified: Secondary | ICD-10-CM

## 2023-11-04 DIAGNOSIS — R7303 Prediabetes: Secondary | ICD-10-CM | POA: Diagnosis not present

## 2023-11-04 DIAGNOSIS — H538 Other visual disturbances: Secondary | ICD-10-CM

## 2023-11-04 DIAGNOSIS — M609 Myositis, unspecified: Secondary | ICD-10-CM

## 2023-11-04 DIAGNOSIS — E782 Mixed hyperlipidemia: Secondary | ICD-10-CM | POA: Diagnosis not present

## 2023-11-04 DIAGNOSIS — Z789 Other specified health status: Secondary | ICD-10-CM

## 2023-11-04 DIAGNOSIS — I679 Cerebrovascular disease, unspecified: Secondary | ICD-10-CM | POA: Diagnosis not present

## 2023-11-04 DIAGNOSIS — R109 Unspecified abdominal pain: Secondary | ICD-10-CM

## 2023-11-05 ENCOUNTER — Ambulatory Visit: Payer: Medicare HMO | Admitting: Physical Therapy

## 2023-11-11 DIAGNOSIS — Z87891 Personal history of nicotine dependence: Secondary | ICD-10-CM | POA: Diagnosis not present

## 2023-11-11 DIAGNOSIS — G4733 Obstructive sleep apnea (adult) (pediatric): Secondary | ICD-10-CM | POA: Diagnosis not present

## 2023-11-11 DIAGNOSIS — J301 Allergic rhinitis due to pollen: Secondary | ICD-10-CM | POA: Diagnosis not present

## 2023-11-11 DIAGNOSIS — G4700 Insomnia, unspecified: Secondary | ICD-10-CM | POA: Diagnosis not present

## 2023-11-12 DIAGNOSIS — Z8673 Personal history of transient ischemic attack (TIA), and cerebral infarction without residual deficits: Secondary | ICD-10-CM | POA: Diagnosis not present

## 2023-11-12 DIAGNOSIS — H538 Other visual disturbances: Secondary | ICD-10-CM | POA: Diagnosis not present

## 2023-11-16 ENCOUNTER — Other Ambulatory Visit: Payer: Self-pay | Admitting: Internal Medicine

## 2023-11-16 DIAGNOSIS — I2510 Atherosclerotic heart disease of native coronary artery without angina pectoris: Secondary | ICD-10-CM

## 2023-11-16 DIAGNOSIS — E785 Hyperlipidemia, unspecified: Secondary | ICD-10-CM

## 2023-11-17 ENCOUNTER — Other Ambulatory Visit: Payer: Self-pay | Admitting: Internal Medicine

## 2023-11-18 ENCOUNTER — Ambulatory Visit: Payer: Medicare HMO | Admitting: Student

## 2023-11-18 ENCOUNTER — Ambulatory Visit: Payer: Medicare HMO | Admitting: Internal Medicine

## 2023-11-18 ENCOUNTER — Encounter: Payer: Self-pay | Admitting: Student

## 2023-11-18 VITALS — BP 130/80 | HR 67 | Temp 97.6°F | Ht 68.0 in | Wt 185.0 lb

## 2023-11-18 DIAGNOSIS — B354 Tinea corporis: Secondary | ICD-10-CM | POA: Diagnosis not present

## 2023-11-18 DIAGNOSIS — R21 Rash and other nonspecific skin eruption: Secondary | ICD-10-CM

## 2023-11-18 MED ORDER — ALPRAZOLAM 0.5 MG PO TABS: 0.5000 mg | ORAL_TABLET | Freq: Every day | ORAL | 0 refills | Status: DC | PRN

## 2023-11-18 MED ORDER — TRIAMCINOLONE ACETONIDE 0.5 % EX OINT: 1.0000 | TOPICAL_OINTMENT | Freq: Two times a day (BID) | CUTANEOUS | 0 refills | Status: DC

## 2023-11-18 MED ORDER — KETOCONAZOLE 2 % EX CREA: 1.0000 | TOPICAL_CREAM | Freq: Every day | CUTANEOUS | 0 refills | Status: AC

## 2023-11-18 NOTE — Progress Notes (Signed)
 Acute Office Visit  Subjective:     Patient ID: Haley Beasley, female    DOB: [birth year], 68 y.o.   MRN: [id number]  Chief Complaint  Patient presents with   office visit    Spot on finger and lower back itching started 1 month ago      HPI  Haley Beasley is a 68 year old female who presents for concerns new spots of her fingers and lower back. The patient also notes that she has not stopped itching around her bra line and abdomen after her stroke in January. She notice red, scaly areas on right hand first finger 3 days ago, the same is now seen on thumb and middle finger. She says the areas burns only when wet. She has used neosporin without relief. On the left lower back there is also a new rash, pt reports it itches but does not burn. Lastly there is a dark circle on the inside of her lower lip, notice this in the past couple of days. She says it tingles but does not sting. No history of fevers or fever blisters. Patient denies insect bite, changes in laundry detergent or perfumes, no environmental allergies.    Review of Systems  Constitutional: Negative.   HENT: Negative.    Eyes: Negative.   Respiratory: Negative.    Cardiovascular: Negative.   Gastrointestinal: Negative.   Genitourinary: Negative.   Musculoskeletal: Negative.   Skin:  Positive for itching and rash.  Neurological: Negative.   Endo/Heme/Allergies: Negative.   Psychiatric/Behavioral: Negative.          Objective:    BP 130/80 (BP Location: Right Arm, Patient Position: Sitting, Cuff Size: Normal)   Pulse 67   Temp 97.6 F (36.4 C)   Ht 5\' 8"  (1.727 m)   Wt 185 lb (83.9 kg)   SpO2 96%   BMI 28.13 kg/m    Physical Exam Vitals reviewed.  Constitutional:      Appearance: Normal appearance.  HENT:     Nose: Nose normal.     Mouth/Throat:     Mouth: Mucous membranes are moist.  Eyes:     Conjunctiva/sclera: Conjunctivae normal.     Pupils: Pupils are equal, round, and reactive to light.   Cardiovascular:     Rate and Rhythm: Normal rate and regular rhythm.     Pulses: Normal pulses.     Heart sounds: Normal heart sounds.  Pulmonary:     Effort: Pulmonary effort is normal.     Breath sounds: Normal breath sounds.  Abdominal:     General: Abdomen is flat. Bowel sounds are normal.     Palpations: Abdomen is soft.  Skin:    General: Skin is warm and dry.     Capillary Refill: Capillary refill takes more than 3 seconds.     Findings: Lesion and rash present. Rash is scaling and vesicular.     Comments: Right hand-Thumb, 1st, and middle digits with scaling. Small concentric circles.   Left buttock annular vesicular rash   Lower lip: single dark blue soft smooth bump.   Neurological:     General: No focal deficit present.     Mental Status: She is alert and oriented to person, place, and time.  Psychiatric:        Mood and Affect: Mood normal.        Behavior: Behavior normal.     No results found for any visits on 11/18/23.      Assessment &  Plan:   Problem List Items Addressed This Visit     Rash - Primary   Patient presents with multiple new areas on her skin that appeared in the last 3 days. I want her to monitor the areas for changes or spread. She will use triamcinolone cream and ketoconazole on hand and lower back.   I am placing referral to dermatology for evaluation.       Relevant Orders   Ambulatory referral to Dermatology   Other Visit Diagnoses       Tinea corporis       Relevant Medications   ketoconazole (NIZORAL) 2 % cream   Other Relevant Orders   Ambulatory referral to Dermatology       Meds ordered this encounter  Medications   ALPRAZolam (XANAX) 0.5 MG tablet    Sig: Take 1 tablet (0.5 mg total) by mouth daily as needed for sleep or anxiety.    Dispense:  30 tablet    Refill:  0   triamcinolone ointment (KENALOG) 0.5 %    Sig: Apply 1 Application topically 2 (two) times daily. For 7 days    Dispense:  15 g    Refill:  0    ketoconazole (NIZORAL) 2 % cream    Sig: Apply 1 Application topically daily for 14 days.    Dispense:  15 g    Refill:  0    No follow-ups on file.  Edwena Blow, NP

## 2023-11-18 NOTE — Patient Instructions (Signed)
 Start taking claritin or zyrtec one tab daily for environmental allergies and rash.

## 2023-11-18 NOTE — Assessment & Plan Note (Signed)
 Patient presents with multiple new areas on her skin that appeared in the last 3 days. I want her to monitor the areas for changes or spread. She will use triamcinolone cream and ketoconazole on hand and lower back.   I am placing referral to dermatology for evaluation.

## 2023-12-09 ENCOUNTER — Ambulatory Visit: Payer: Medicare HMO | Admitting: Internal Medicine

## 2023-12-09 ENCOUNTER — Encounter: Payer: Self-pay | Admitting: Internal Medicine

## 2023-12-09 VITALS — BP 124/78 | HR 94 | Temp 98.1°F | Resp 16 | Ht 68.0 in | Wt 182.2 lb

## 2023-12-09 DIAGNOSIS — E785 Hyperlipidemia, unspecified: Secondary | ICD-10-CM | POA: Diagnosis not present

## 2023-12-09 DIAGNOSIS — M609 Myositis, unspecified: Secondary | ICD-10-CM

## 2023-12-09 DIAGNOSIS — I2510 Atherosclerotic heart disease of native coronary artery without angina pectoris: Secondary | ICD-10-CM

## 2023-12-09 DIAGNOSIS — I10 Essential (primary) hypertension: Secondary | ICD-10-CM | POA: Diagnosis not present

## 2023-12-09 DIAGNOSIS — J069 Acute upper respiratory infection, unspecified: Secondary | ICD-10-CM | POA: Diagnosis not present

## 2023-12-09 DIAGNOSIS — T466X5A Adverse effect of antihyperlipidemic and antiarteriosclerotic drugs, initial encounter: Secondary | ICD-10-CM

## 2023-12-09 DIAGNOSIS — E782 Mixed hyperlipidemia: Secondary | ICD-10-CM

## 2023-12-09 DIAGNOSIS — G4733 Obstructive sleep apnea (adult) (pediatric): Secondary | ICD-10-CM | POA: Diagnosis not present

## 2023-12-09 MED ORDER — REPATHA SURECLICK 140 MG/ML ~~LOC~~ SOAJ: 140.0000 mg | SUBCUTANEOUS | 3 refills | Status: DC

## 2023-12-09 NOTE — Assessment & Plan Note (Signed)
 She was tested for COVID-19, FLU and rapid strep which are all normal.  I think she has a viral illness.  I want her to continue on zyrtec and use flonase.  If she develops yellow or green drainage, she is to call back.  Continue supportive care.

## 2023-12-09 NOTE — Assessment & Plan Note (Signed)
 -  As below

## 2023-12-09 NOTE — Progress Notes (Signed)
 Office Visit  Subjective   Patient ID: Haley Beasley   DOB: [birth year]   Age: 68 y.o.   MRN: [id number]   Chief Complaint Chief Complaint  Patient presents with   Follow-up     History of Present Illness Haley Beasley returns today for a regular follow up and for an acute visit due to respiratory illness.  She began having sinus congestion with non-productive cough that started 3-4 days ago.  She had no nasal discharge, fevers, chills, myalgias, headaches, chest congestion, SOB, wheezing, nausea, vomiting or diarrhea.  She has been having post nasal drip and sore throat.  She has been taking zyrtec for her symptoms.    The patient is also here for followup of her cholesterol.  She unfortunately had a stroke where she was hospitalized at [facility] in 09/2023.  I want her to be on repatha for 2 months straight and we will repeat her FLP at that time.  Her goal LDL <70.  If it is not controlled at that time, we will add zetia to her regimen.  She was sent home on ASA 81mg  daily and plavix 75mg  daily (x3 weeks).  Her LDL at that time was 139 and her HgBA1c was 5.9%.   She had not been on a full 2 months of repatha and I asked her to come back today for a cholesterol recheck.   The patient does have an allergy to statins where she has been on lipitor, zocor and crestor and they all caused myalgias.  She is currently followed by the [facility] with her last visit in 06/2022. Overall, she states she is doing well and is without any complaints or problems at this time. She specifically denies chest pain, abdominal pain, nausea, diarrhea, and myalgias. She remains on dietary management as well as the following cholesterol lowering medications: Repatha 140mg  twice a month.  She is intolerant to statins simvastatin, crestor, atorvastatin, pravastatin which all caused myalgias.  The patient is currently fasting for her labs today.   The patient is a 68 year old female who presents for a follow-up  evaluation of hypertension.  She was diagnosed with HTN in 2020.  Since her last visit, she has not had any problems.  The patient has been checking her blood pressure at home.  She states her systolic BP is in the 120's.  The patient's current medications include: losartan 25 mg daily. The patient has been tolerating her medications well. The patient denies any visual changes, dizziness, headaches, chest pain, lightheadness, palpitations, generalized weakness or edema. She reports there have been no other symptoms noted.  She does not smoke.        Past Medical History Past Medical History:  Diagnosis Date   Anxiety    Breast cancer (HCC)    Depression    GERD (gastroesophageal reflux disease)    Hyperlipidemia    Hypertension    Hypothyroidism    Memory loss    OSA (obstructive sleep apnea)      Allergies Allergies  Allergen Reactions   Fluogen [Influenza Virus Vaccine]    Penicillins      Medications  Current Outpatient Medications:    ALPRAZolam (XANAX) 0.5 MG tablet, Take 1 tablet (0.5 mg total) by mouth daily as needed for sleep or anxiety., Disp: 30 tablet, Rfl: 0   aspirin EC 81 MG tablet, Take 1 tablet (81 mg total) by mouth daily. Swallow whole., Disp: 30 tablet, Rfl: 12   Evolocumab (  REPATHA SURECLICK) 140 MG/ML SOAJ, Inject 140 mg into the skin every 14 (fourteen) days., Disp: 6 mL, Rfl: 3   fluticasone (FLONASE) 50 MCG/ACT nasal spray, Place 1 spray into both nostrils daily., Disp: 16 g, Rfl: 3   losartan (COZAAR) 25 MG tablet, TAKE 1 TABLET(25 MG) BY MOUTH DAILY, Disp: 90 tablet, Rfl: 1   pantoprazole (PROTONIX) 40 MG tablet, Take 1 tablet (40 mg total) by mouth daily., Disp: 90 tablet, Rfl: 0   sertraline (ZOLOFT) 25 MG tablet, Take 1 tablet (25 mg total) by mouth daily., Disp: 90 tablet, Rfl: 3   SYNTHROID 75 MCG tablet, TAKE 1 TABLET(75 MCG) BY MOUTH DAILY (Patient taking differently: Take 75 mcg by mouth daily before breakfast. TAKE 1 TABLET(75 MCG) BY MOUTH  DAILY), Disp: 90 tablet, Rfl: 1   triamcinolone ointment (KENALOG) 0.5 %, Apply 1 Application topically 2 (two) times daily. For 7 days, Disp: 15 g, Rfl: 0   zaleplon (SONATA) 10 MG capsule, Take 10 mg by mouth at bedtime., Disp: , Rfl:    Review of Systems Review of Systems  Constitutional:  Negative for chills and malaise/fatigue.  HENT:  Positive for congestion and sore throat.   Eyes:  Negative for blurred vision and double vision.  Respiratory:  Positive for cough. Negative for sputum production, shortness of breath and wheezing.   Cardiovascular:  Negative for chest pain, palpitations and leg swelling.  Gastrointestinal:  Negative for abdominal pain, constipation, diarrhea, nausea and vomiting.  Genitourinary:  Negative for frequency.  Musculoskeletal:  Negative for myalgias.  Skin:  Negative for itching and rash.  Neurological:  Negative for dizziness, weakness and headaches.  Endo/Heme/Allergies:  Negative for polydipsia.       Objective:    Vitals BP 124/78   Pulse 94   Temp 98.1 F (36.7 C)   Resp 16   Ht 5\' 8"  (1.727 m)   Wt 182 lb 3.2 oz (82.6 kg)   SpO2 97%   BMI 27.70 kg/m    Physical Examination Physical Exam Constitutional:      Appearance: Normal appearance. She is not ill-appearing.  HENT:     Right Ear: Ear canal and external ear normal.     Left Ear: Ear canal and external ear normal.     Ears:     Comments: She has clear fluid behind both ears with slight TM bulging L worse than R    Nose: Nose normal. No congestion.     Mouth/Throat:     Mouth: Mucous membranes are moist.     Pharynx: Oropharynx is clear. Posterior oropharyngeal erythema present. No oropharyngeal exudate.  Cardiovascular:     Rate and Rhythm: Normal rate and regular rhythm.     Pulses: Normal pulses.     Heart sounds: No murmur heard.    No friction rub. No gallop.  Pulmonary:     Effort: Pulmonary effort is normal. No respiratory distress.     Breath sounds: No wheezing,  rhonchi or rales.  Abdominal:     General: Bowel sounds are normal. There is no distension.     Palpations: Abdomen is soft.     Tenderness: There is no abdominal tenderness.  Musculoskeletal:     Right lower leg: No edema.     Left lower leg: No edema.  Skin:    General: Skin is warm and dry.     Findings: No rash.  Neurological:     Mental Status: She is alert.  Assessment & Plan:   Viral URI She was tested for COVID-19, FLU and rapid strep which are all normal.  I think she has a viral illness.  I want her to continue on zyrtec and use flonase.  If she develops yellow or green drainage, she is to call back.  Continue supportive care.  Statin-induced myositis As below.  Hyperlipidemia She has had recent stroke.  Her goal LDL <70.  She remains on repatha and we will check a FLP with apolipoprotein B.    Essential hypertension Her BP is controlled today and we will continue her current medications.    Return in about 3 months (around 03/10/2024).   Crist Fat, MD

## 2023-12-09 NOTE — Assessment & Plan Note (Signed)
 Her BP is controlled today and we will continue her current medications.

## 2023-12-09 NOTE — Assessment & Plan Note (Signed)
 She has had recent stroke.  Her goal LDL <70.  She remains on repatha and we will check a FLP with apolipoprotein B.

## 2023-12-10 LAB — LIPID PANEL
Chol/HDL Ratio: 2.5 ratio (ref 0.0–4.4)
Cholesterol, Total: 163 mg/dL (ref 100–199)
HDL: 65 mg/dL (ref >39)
LDL Chol Calc (NIH): 75 mg/dL (ref 0–99)
Triglycerides: 131 mg/dL (ref 0–149)
VLDL Cholesterol Cal: 23 mg/dL (ref 5–40)

## 2023-12-10 LAB — APOLIPOPROTEIN B: Apolipoprotein B: 67 mg/dL (ref <90)

## 2023-12-12 ENCOUNTER — Other Ambulatory Visit: Payer: Self-pay | Admitting: Internal Medicine

## 2023-12-12 MED ORDER — DOXYCYCLINE MONOHYDRATE 100 MG PO CAPS: 100.0000 mg | ORAL_CAPSULE | Freq: Two times a day (BID) | ORAL | 0 refills | Status: DC

## 2023-12-16 ENCOUNTER — Other Ambulatory Visit: Payer: Self-pay | Admitting: Internal Medicine

## 2023-12-16 DIAGNOSIS — J029 Acute pharyngitis, unspecified: Secondary | ICD-10-CM | POA: Diagnosis not present

## 2023-12-16 DIAGNOSIS — R0602 Shortness of breath: Secondary | ICD-10-CM | POA: Diagnosis not present

## 2023-12-16 DIAGNOSIS — J301 Allergic rhinitis due to pollen: Secondary | ICD-10-CM | POA: Diagnosis not present

## 2023-12-16 DIAGNOSIS — G4700 Insomnia, unspecified: Secondary | ICD-10-CM | POA: Diagnosis not present

## 2023-12-16 DIAGNOSIS — G4733 Obstructive sleep apnea (adult) (pediatric): Secondary | ICD-10-CM | POA: Diagnosis not present

## 2023-12-21 ENCOUNTER — Ambulatory Visit (INDEPENDENT_AMBULATORY_CARE_PROVIDER_SITE_OTHER): Payer: Medicare HMO | Admitting: Neurology

## 2023-12-21 ENCOUNTER — Encounter: Payer: Self-pay | Admitting: Neurology

## 2023-12-21 VITALS — BP 117/54 | HR 54 | Ht 67.0 in | Wt 182.0 lb

## 2023-12-21 DIAGNOSIS — E7849 Other hyperlipidemia: Secondary | ICD-10-CM

## 2023-12-21 DIAGNOSIS — I6381 Other cerebral infarction due to occlusion or stenosis of small artery: Secondary | ICD-10-CM | POA: Diagnosis not present

## 2023-12-21 MED ORDER — EZETIMIBE 10 MG PO TABS: 10.0000 mg | ORAL_TABLET | Freq: Every day | ORAL | 1 refills | Status: DC

## 2023-12-21 NOTE — Patient Instructions (Signed)
 I had a long d/w patient and her husband about her recent lacunar thalamic stroke, risk for recurrent stroke/TIAs, personally independently reviewed imaging studies and stroke evaluation results and answered questions.Continue aspirin 81 mg daily  for secondary stroke prevention and maintain strict control of hypertension with blood pressure goal below 130/90, diabetes with hemoglobin A1c goal below 6.5% and lipids with LDL cholesterol goal below 70 mg/dL. I also advised the patient to eat a healthy diet with plenty of whole grains, cereals, fruits and vegetables, exercise regularly and maintain ideal body weight .I recommend we add Zetia 10 mg daily since her LDL is still suboptimally controlled on Repatha.  Followup in the future with my nurse practitioner in 6 months or call earlier if necessary.  Stroke Prevention Some medical conditions and behaviors can lead to a higher chance of having a stroke. You can help prevent a stroke by eating healthy, exercising, not smoking, and managing any medical conditions you have. Stroke is a leading cause of functional impairment. Primary prevention is particularly important because a majority of strokes are first-time events. Stroke changes the lives of not only those who experience a stroke but also their family and other caregivers. How can this condition affect me? A stroke is a medical emergency and should be treated right away. A stroke can lead to brain damage and can sometimes be life-threatening. If a person gets medical treatment right away, there is a better chance of surviving and recovering from a stroke. What can increase my risk? The following medical conditions may increase your risk of a stroke: Cardiovascular disease. High blood pressure (hypertension). Diabetes. High cholesterol. Sickle cell disease. Blood clotting disorders (hypercoagulable state). Obesity. Sleep disorders (obstructive sleep apnea). Other risk factors include: Being older  than age 25. Having a history of blood clots, stroke, or mini-stroke (transient ischemic attack, TIA). Genetic factors, such as race, ethnicity, or a family history of stroke. Smoking cigarettes or using other tobacco products. Taking birth control pills, especially if you also use tobacco. Heavy use of alcohol or drugs, especially cocaine and methamphetamine. Physical inactivity. What actions can I take to prevent this? Manage your health conditions High cholesterol levels. Eating a healthy diet is important for preventing high cholesterol. If cholesterol cannot be managed through diet alone, you may need to take medicines. Take any prescribed medicines to control your cholesterol as told by your health care provider. Hypertension. To reduce your risk of stroke, try to keep your blood pressure below 130/80. Eating a healthy diet and exercising regularly are important for controlling blood pressure. If these steps are not enough to manage your blood pressure, you may need to take medicines. Take any prescribed medicines to control hypertension as told by your health care provider. Ask your health care provider if you should monitor your blood pressure at home. Have your blood pressure checked every year, even if your blood pressure is normal. Blood pressure increases with age and some medical conditions. Diabetes. Eating a healthy diet and exercising regularly are important parts of managing your blood sugar (glucose). If your blood sugar cannot be managed through diet and exercise, you may need to take medicines. Take any prescribed medicines to control your diabetes as told by your health care provider. Get evaluated for obstructive sleep apnea. Talk to your health care provider about getting a sleep evaluation if you snore a lot or have excessive sleepiness. Make sure that any other medical conditions you have, such as atrial fibrillation or atherosclerosis, are managed.  Nutrition Follow  instructions from your health care provider about what to eat or drink to help manage your health condition. These instructions may include: Reducing your daily calorie intake. Limiting how much salt (sodium) you use to 1,500 milligrams (mg) each day. Using only healthy fats for cooking, such as olive oil, canola oil, or sunflower oil. Eating healthy foods. You can do this by: Choosing foods that are high in fiber, such as whole grains, and fresh fruits and vegetables. Eating at least 5 servings of fruits and vegetables a day. Try to fill one-half of your plate with fruits and vegetables at each meal. Choosing lean protein foods, such as lean cuts of meat, poultry without skin, fish, tofu, beans, and nuts. Eating low-fat dairy products. Avoiding foods that are high in sodium. This can help lower blood pressure. Avoiding foods that have saturated fat, trans fat, and cholesterol. This can help prevent high cholesterol. Avoiding processed and prepared foods. Counting your daily carbohydrate intake.  Lifestyle If you drink alcohol: Limit how much you have to: 0-1 drink a day for women who are not pregnant. 0-2 drinks a day for men. Know how much alcohol is in your drink. In the [place] one drink equals one 12 oz bottle of beer ( ), one 5 oz glass of wine ( ), or one 1 oz glass of hard liquor (44mL). Do not use any products that contain nicotine or tobacco. These products include cigarettes, chewing tobacco, and vaping devices, such as e-cigarettes. If you need help quitting, ask your health care provider. Avoid secondhand smoke. Do not use drugs. Activity  Try to stay at a healthy weight. Get at least 30 minutes of exercise on most days, such as: Fast walking. Biking. Swimming. Medicines Take over-the-counter and prescription medicines only as told by your health care provider. Aspirin or blood thinners (antiplatelets or anticoagulants) may be recommended to reduce your risk of  forming blood clots that can lead to stroke. Avoid taking birth control pills. Talk to your health care provider about the risks of taking birth control pills if: You are over 72 years old. You smoke. You get very bad headaches. You have had a blood clot. Where to find more information American Stroke Association: www.strokeassociation.org Get help right away if: You or a loved one has any symptoms of a stroke. "BE FAST" is an easy way to remember the main warning signs of a stroke: B - Balance. Signs are dizziness, sudden trouble walking, or loss of balance. E - Eyes. Signs are trouble seeing or a sudden change in vision. F - Face. Signs are sudden weakness or numbness of the face, or the face or eyelid drooping on one side. A - Arms. Signs are weakness or numbness in an arm. This happens suddenly and usually on one side of the body. S - Speech. Signs are sudden trouble speaking, slurred speech, or trouble understanding what people say. T - Time. Time to call emergency services. Write down what time symptoms started. You or a loved one has other signs of a stroke, such as: A sudden, severe headache with no known cause. Nausea or vomiting. Seizure. These symptoms may represent a serious problem that is an emergency. Do not wait to see if the symptoms will go away. Get medical help right away. Call your local emergency services (911 in the [place] Do not drive yourself to the hospital. Summary You can help to prevent a stroke by eating healthy, exercising, not smoking, limiting alcohol intake, and managing  any medical conditions you may have. Do not use any products that contain nicotine or tobacco. These include cigarettes, chewing tobacco, and vaping devices, such as e-cigarettes. If you need help quitting, ask your health care provider. Remember "BE FAST" for warning signs of a stroke. Get help right away if you or a loved one has any of these signs. This information is not intended to  replace advice given to you by your health care provider. Make sure you discuss any questions you have with your health care provider. Document Revised: 08/11/2022 Document Reviewed: 08/11/2022 Elsevier Patient Education  2024 [employer].

## 2023-12-21 NOTE — Progress Notes (Signed)
 [facility] [address] [place]. [place] [address] [phone]       OFFICE FOLLOW-UP NOTE  [name] Date of Birth:  1956-03-07 Medical Record Number:  [id number]   HPI: Haley Beasley is a 68 year old pleasant lady seen today for initial office follow-up visit following hospital consultation for stroke in January 2025.  She is accompanied by her husband.  History is obtained from them and review of electronic medical records.  I personally reviewed pertinent available imaging films in PACS. She  has a past medical history of Anxiety, Breast cancer (HCC), Depression, GERD (gastroesophageal reflux disease), Hyperlipidemia, Hypertension, Hypothyroidism, Memory loss, and OSA (obstructive sleep apnea).  She presented on 10/03/2023 with sudden onset of left-sided weakness.  This started suddenly at 9 AM.  She woke up normal that day.  She was brought in by [facility] for emergent evaluation as a code stroke.  She had left hemiparesis on exam with NIH stroke scale of 4.  She met criteria for thrombolysis and was given TNK after discussion of risk benefits and alternatives.  She was monitored in the ICU and did well and showed significant improvement.  CT head showed no acute abnormality MRI scan was initially read as being negative but upon further review small punctate acute infarct is noted in the anterior inferior right thalamus.  CT angiogram showed no large vessel stenosis or occlusion.  Echocardiogram showed ejection fraction 60 to 65%.  Left atrial size was normal.  LDL cholesterol was elevated at 139 mg percent and hemoglobin A1c was normal at 5.9.  She was started on dual antiplatelet therapy aspirin Plavix for 3 weeks followed by aspirin alone.  Patient had missed several doses of Repatha at home and she was counseled to continue it.  Patient says she has done well since discharge.  Left-sided weakness has improved.  She is tolerating aspirin well does get occasional  bruising.  She is lost some weight.  She had trouble tolerating statins and has been taking Repatha now regularly.  She complains of occasional feeling of imbalance particularly when she makes sudden movement.  She had some left foot issues and underwent recent surgery for that which went well.  She has no complaints today. ROS:   14 system review of systems is positive for bruising, myalgias, arthralgias all other systems negative  PMH:  Past Medical History:  Diagnosis Date   Anxiety    Breast cancer (HCC)    Depression    GERD (gastroesophageal reflux disease)    Hyperlipidemia    Hypertension    Hypothyroidism    Memory loss    OSA (obstructive sleep apnea)     Social History:  Social History   Socioeconomic History   Marital status: Married    Spouse name: [name]   Number of children: 2   Years of education: 12   Highest education level: Not on file  Occupational History   Occupation: RETIRED INSURANCE AGENT  Tobacco Use   Smoking status: Former    Current packs/day: 0.00    Types: Cigarettes    Quit date: 09/23/2007    Years since quitting: 16.2   Smokeless tobacco: Never  Vaping Use   Vaping status: Never Used  Substance and Sexual Activity   Alcohol use: Yes    Comment: OCCASIONAL   Drug use: Not Currently   Sexual activity: Yes  Other Topics Concern   Not on file  Social History Narrative   Lives with husband  Social Drivers of Corporate investment banker Strain: Not on file  Food Insecurity: No Food Insecurity (10/03/2023)   Hunger Vital Sign    Worried About Running Out of Food in the Last Year: Never true    Ran Out of Food in the Last Year: Never true  Transportation Needs: No Transportation Needs (10/03/2023)   PRAPARE - Administrator, Civil Service (Medical): No    Lack of Transportation (Non-Medical): No  Physical Activity: Not on file  Stress: Not on file  Social Connections: Moderately Integrated (10/03/2023)   Social Connection  and Isolation Panel [NHANES]    Frequency of Communication with Friends and Family: Three times a week    Frequency of Social Gatherings with Friends and Family: Once a week    Attends Religious Services: 1 to 4 times per year    Active Member of [employer] or Organizations: No    Attends Banker Meetings: Never    Marital Status: Married  Catering manager Violence: Not At Risk (10/03/2023)   Humiliation, Afraid, Rape, and Kick questionnaire    Fear of Current or Ex-Partner: No    Emotionally Abused: No    Physically Abused: No    Sexually Abused: No    Medications:   Current Outpatient Medications on File Prior to Visit  Medication Sig Dispense Refill   ALPRAZolam (XANAX) 0.5 MG tablet Take 1 tablet (0.5 mg total) by mouth daily as needed for sleep or anxiety. 30 tablet 0   aspirin EC 81 MG tablet Take 1 tablet (81 mg total) by mouth daily. Swallow whole. 30 tablet 12   doxycycline (MONODOX) 100 MG capsule Take 1 capsule (100 mg total) by mouth 2 (two) times daily. 20 capsule 0   Evolocumab (REPATHA SURECLICK) 140 MG/ML SOAJ Inject 140 mg into the skin every 14 (fourteen) days. 6 mL 3   fluticasone (FLONASE) 50 MCG/ACT nasal spray Place 1 spray into both nostrils daily. 16 g 3   losartan (COZAAR) 25 MG tablet TAKE 1 TABLET(25 MG) BY MOUTH DAILY 90 tablet 1   pantoprazole (PROTONIX) 40 MG tablet Take 1 tablet (40 mg total) by mouth daily. 90 tablet 0   sertraline (ZOLOFT) 25 MG tablet Take 1 tablet (25 mg total) by mouth daily. 90 tablet 3   SYNTHROID 75 MCG tablet TAKE 1 TABLET(75 MCG) BY MOUTH DAILY 60 tablet 2   triamcinolone ointment (KENALOG) 0.5 % Apply 1 Application topically 2 (two) times daily. For 7 days 15 g 0   zaleplon (SONATA) 10 MG capsule Take 10 mg by mouth at bedtime.     No current facility-administered medications on file prior to visit.    Allergies:   Allergies  Allergen Reactions   Fluogen [Influenza Virus Vaccine]    Penicillins     Physical  Exam General: well developed, well nourished, seated, in no evident distress Head: head normocephalic and atraumatic.  Neck: supple with no carotid or supraclavicular bruits Cardiovascular: regular rate and rhythm, no murmurs Musculoskeletal: no deformity Skin:  no rash/petichiae Vascular:  Normal pulses all extremities Vitals:   12/21/23 0824  BP: (!) 117/54  Pulse: (!) 54   Neurologic Exam Mental Status: Awake and fully alert. Oriented to place and time. Recent and remote memory intact. Attention span, concentration and fund of knowledge appropriate. Mood and affect appropriate.  Cranial Nerves: Fundoscopic exam reveals sharp disc margins. Pupils equal, briskly reactive to light. Extraocular movements full without nystagmus. Visual fields full to confrontation.  Hearing intact. Facial sensation intact. Face, tongue, palate moves normally and symmetrically.  Motor: Normal bulk and tone. Normal strength in all tested extremity muscles. Sensory.: intact to touch ,pinprick .position and vibratory sensation.  Coordination: Rapid alternating movements normal in all extremities. Finger-to-nose and heel-to-shin performed accurately bilaterally. Gait and Station: Arises from chair without difficulty. Stance is normal. Gait demonstrates normal stride length and balance . Able to heel, toe and tandem walk with moderate difficulty.  Reflexes: 1+ and symmetric. Toes downgoing.   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  Vascular risk factors of hypertension and hyperlipidemia.     PLAN:I had a long d/w patient and her husband about her recent lacunar thalamic stroke, risk for recurrent stroke/TIAs, personally independently reviewed imaging studies and stroke evaluation results and answered questions.Continue aspirin 81 mg daily  for  secondary stroke prevention and maintain strict control of hypertension with blood pressure goal below 130/90, diabetes with hemoglobin A1c goal below 6.5% and lipids with LDL cholesterol goal below 70 mg/dL. I also advised the patient to eat a healthy diet with plenty of whole grains, cereals, fruits and vegetables, exercise regularly and maintain ideal body weight .I recommend we add Zetia 10 mg daily since her LDL is still suboptimally controlled on Repatha.  Followup in the future with my nurse practitioner in 6 months or call earlier if necessary.  Greater than 50% of time during this 35 minute visit was spent on counseling,explanation of diagnosis of lacunar stroke and hyperlipidemia, planning of further management, discussion with patient and family and coordination of care Delia Heady, MD Note: This document was prepared with digital dictation and possible smart phrase technology. Any transcriptional errors that result from this process are unintentional

## 2023-12-21 NOTE — Progress Notes (Signed)
 Her cholesterol levels are right at being controlled. Watch fat in diets and continue on repatha.  Pt aware of labs.

## 2023-12-23 ENCOUNTER — Ambulatory Visit: Admitting: Internal Medicine

## 2023-12-23 DIAGNOSIS — E782 Mixed hyperlipidemia: Secondary | ICD-10-CM | POA: Diagnosis not present

## 2023-12-23 DIAGNOSIS — J301 Allergic rhinitis due to pollen: Secondary | ICD-10-CM | POA: Diagnosis not present

## 2023-12-23 NOTE — Progress Notes (Signed)
 Nurse visit  Blood draw

## 2023-12-24 ENCOUNTER — Other Ambulatory Visit: Payer: Self-pay | Admitting: Student

## 2023-12-24 LAB — BASIC METABOLIC PANEL WITH GFR
BUN/Creatinine Ratio: 21 (ref 12–28)
BUN: 16 mg/dL (ref 8–27)
CO2: 21 mmol/L (ref 20–29)
Calcium: 9.5 mg/dL (ref 8.7–10.3)
Chloride: 103 mmol/L (ref 96–106)
Creatinine, Ser: 0.75 mg/dL (ref 0.57–1.00)
Glucose: 83 mg/dL (ref 70–99)
Potassium: 4.4 mmol/L (ref 3.5–5.2)
Sodium: 141 mmol/L (ref 134–144)
eGFR: 87 mL/min/{1.73_m2} (ref >59)

## 2023-12-28 ENCOUNTER — Other Ambulatory Visit: Payer: Self-pay

## 2023-12-29 ENCOUNTER — Other Ambulatory Visit: Payer: Self-pay | Admitting: Internal Medicine

## 2023-12-29 MED ORDER — ALPRAZOLAM 0.5 MG PO TABS: 0.5000 mg | ORAL_TABLET | Freq: Every day | ORAL | 2 refills | Status: DC | PRN

## 2023-12-31 ENCOUNTER — Encounter: Payer: Self-pay | Admitting: Internal Medicine

## 2024-01-05 ENCOUNTER — Telehealth: Payer: Self-pay

## 2024-01-05 NOTE — Patient Outreach (Signed)
 No telephone outreach to patient. Dr. Janett Medin obtained mRS successfully on 12/21/23. MRS= 1  Kaye Parsons [facility] Health Care Management Assistant  Direct Dial: [phone]  Fax: [phone] Website: [email or web address]

## 2024-01-08 ENCOUNTER — Other Ambulatory Visit: Payer: Self-pay | Admitting: Internal Medicine

## 2024-01-20 ENCOUNTER — Other Ambulatory Visit: Payer: Self-pay | Admitting: Internal Medicine

## 2024-01-20 ENCOUNTER — Ambulatory Visit: Admitting: Internal Medicine

## 2024-01-20 DIAGNOSIS — E782 Mixed hyperlipidemia: Secondary | ICD-10-CM | POA: Diagnosis not present

## 2024-01-20 NOTE — Telephone Encounter (Signed)
 Okay we are working getting you the info you need again, I think it was sent when you was out.

## 2024-01-20 NOTE — Progress Notes (Signed)
 Nurse visit  Blood draw

## 2024-01-20 NOTE — Progress Notes (Signed)
 The patient is a 68 year old female , post-menopausal, who has a history of osteoporosis. She tells me that her last bone density in [place]  showed she had osteopenia. We did a bone density on 09/04/2022 which showed a lumbar spine t-score of -2.6 where she was diagnosed with osteoporosis. I asked her to start on aldendronate 70mg  weekly which she states her reflux has worsened. We therefore placed her on iv reclast . She has no specific complaints related to bone loss. She reports a family history of osteoporosis. The following risk factors are noted: history of tobacco abuse (quit in 2009), She denies the following: diabetes mellitus, high caffeine intake, alcohol consumption of more that 7 ounces per week, daily prednisone  use, hyperthyroidism, surgical resection of her bowel, and surgical resection of her stomach. She states she exercises routinely with walking and biking. I also asked her to start on calcium  and Vit D but the patient states she gets severe constipation from calcium  and even VIt D supplementation. I did discuss medications with her and I wanted her to stop the alendronate  as it is causing worsening reflux. We discussed prolia vs. Reclast  and possible side effects. We set her up for iv reclast  which her last dose was done on 10/29/2022.   A/P:  Osteoporosis.  The patient did not tolerate alendronate  and she needs her osteoporosis treated.  She has had an ankle fracture in 01/2023.  We discussed prolia vs reclast  and I would like for her to receive 3 years of iv reclast  with this being her second year.  We will repeat a bone density next year.

## 2024-01-21 LAB — CMP14 + ANION GAP
ALT: 9 IU/L (ref 0–32)
AST: 11 IU/L (ref 0–40)
Albumin: 4.3 g/dL (ref 3.9–4.9)
Alkaline Phosphatase: 60 IU/L (ref 44–121)
Anion Gap: 16 mmol/L (ref 10.0–18.0)
BUN/Creatinine Ratio: 23 (ref 12–28)
BUN: 18 mg/dL (ref 8–27)
Bilirubin Total: 0.3 mg/dL (ref 0.0–1.2)
CO2: 21 mmol/L (ref 20–29)
Calcium: 9.2 mg/dL (ref 8.7–10.3)
Chloride: 103 mmol/L (ref 96–106)
Creatinine, Ser: 0.79 mg/dL (ref 0.57–1.00)
Globulin, Total: 2.4 g/dL (ref 1.5–4.5)
Glucose: 129 mg/dL — ABNORMAL HIGH (ref 70–99)
Potassium: 4 mmol/L (ref 3.5–5.2)
Sodium: 140 mmol/L (ref 134–144)
Total Protein: 6.7 g/dL (ref 6.0–8.5)
eGFR: 81 mL/min/{1.73_m2} (ref >59)

## 2024-01-22 DIAGNOSIS — J301 Allergic rhinitis due to pollen: Secondary | ICD-10-CM | POA: Diagnosis not present

## 2024-01-25 NOTE — Progress Notes (Signed)
 Her labs look good, she can go for her injection  Patient is aware of labs

## 2024-02-01 DIAGNOSIS — M810 Age-related osteoporosis without current pathological fracture: Secondary | ICD-10-CM | POA: Diagnosis not present

## 2024-02-09 ENCOUNTER — Other Ambulatory Visit: Payer: Self-pay | Admitting: Neurology

## 2024-02-10 ENCOUNTER — Other Ambulatory Visit: Payer: Self-pay

## 2024-02-10 ENCOUNTER — Encounter: Payer: Self-pay | Admitting: Internal Medicine

## 2024-02-10 ENCOUNTER — Ambulatory Visit: Admitting: Internal Medicine

## 2024-02-10 VITALS — BP 120/74 | HR 81 | Temp 98.1°F | Resp 18 | Ht 68.0 in | Wt 176.4 lb

## 2024-02-10 DIAGNOSIS — J0100 Acute maxillary sinusitis, unspecified: Secondary | ICD-10-CM | POA: Diagnosis not present

## 2024-02-10 LAB — POC COVID19 BINAXNOW: SARS Coronavirus 2 Ag: NEGATIVE

## 2024-02-10 MED ORDER — DOXYCYCLINE MONOHYDRATE 100 MG PO CAPS: 100.0000 mg | ORAL_CAPSULE | Freq: Two times a day (BID) | ORAL | 0 refills | Status: DC

## 2024-02-10 NOTE — Progress Notes (Signed)
 Office Visit  Subjective   Patient ID: Haley Beasley   DOB: Oct 15, 1955   Age: 68 y.o.   MRN: [id number]   Chief Complaint Chief Complaint  Patient presents with   Acute Visit    Congestion     History of Present Illness Haley Beasley is a 68 yo female who comes in today for an acute visit for a respiratory illness.  She states this started as sinus congestion about 4 days ago with green-yellow nasal discharge and she is having post nasal drip with mild cough.  She denies any fevers, chills, chest congestion, SOB, wheezing, myalgias, headaches, nausea, vomiting, diarrhea or other problems.  The patient has been using flonase  and claritin.  She does have a history of allergic rhinitis.     Past Medical History Past Medical History:  Diagnosis Date   Anxiety    Breast cancer (HCC)    Depression    GERD (gastroesophageal reflux disease)    Hyperlipidemia    Hypertension    Hypothyroidism    Memory loss    OSA (obstructive sleep apnea)      Allergies Allergies  Allergen Reactions   Fluogen [Influenza Virus Vaccine]    Penicillins      Medications  Current Outpatient Medications:    ALPRAZolam  (XANAX ) 0.5 MG tablet, Take 1 tablet (0.5 mg total) by mouth daily as needed for sleep or anxiety., Disp: 30 tablet, Rfl: 2   aspirin  EC 81 MG tablet, Take 1 tablet (81 mg total) by mouth daily. Swallow whole., Disp: 30 tablet, Rfl: 12   Evolocumab  (REPATHA  SURECLICK) 140 MG/ML SOAJ, Inject 140 mg into the skin every 14 (fourteen) days., Disp: 6 mL, Rfl: 3   ezetimibe  (ZETIA ) 10 MG tablet, Take 1 tablet (10 mg total) by mouth daily., Disp: 30 tablet, Rfl: 1   fluticasone  (FLONASE ) 50 MCG/ACT nasal spray, SHAKE LIQUID AND USE 1 SPRAY IN EACH NOSTRIL DAILY, Disp: 16 g, Rfl: 3   losartan  (COZAAR ) 25 MG tablet, TAKE 1 TABLET(25 MG) BY MOUTH DAILY, Disp: 90 tablet, Rfl: 1   pantoprazole  (PROTONIX ) 40 MG tablet, Take 1 tablet (40 mg total) by mouth daily., Disp: 90 tablet, Rfl: 0    sertraline  (ZOLOFT ) 25 MG tablet, Take 1 tablet (25 mg total) by mouth daily., Disp: 90 tablet, Rfl: 3   SYNTHROID  75 MCG tablet, TAKE 1 TABLET(75 MCG) BY MOUTH DAILY, Disp: 60 tablet, Rfl: 2   triamcinolone  ointment (KENALOG ) 0.5 %, Apply 1 Application topically 2 (two) times daily. For 7 days, Disp: 15 g, Rfl: 0   zaleplon (SONATA) 10 MG capsule, Take 10 mg by mouth at bedtime., Disp: , Rfl:    Review of Systems Review of Systems  Constitutional:  Negative for chills and fever.  Respiratory:  Positive for cough. Negative for sputum production, shortness of breath and wheezing.   Gastrointestinal:  Negative for abdominal pain, constipation, diarrhea, nausea and vomiting.  Musculoskeletal:  Negative for myalgias.  Skin:  Negative for rash.  Neurological:  Negative for dizziness, weakness and headaches.       Objective:    Vitals BP 120/74   Pulse 81   Temp 98.1 F (36.7 C)   Resp 18   Ht 5\' 8"  (1.727 m)   Wt 176 lb 6.4 oz (80 kg)   SpO2 96%   BMI 26.82 kg/m    Physical Examination Physical Exam Constitutional:      Appearance: Normal appearance. She is not ill-appearing.  HENT:  Right Ear: Tympanic membrane, ear canal and external ear normal.     Left Ear: Tympanic membrane, ear canal and external ear normal.     Nose: Congestion present. No rhinorrhea.     Mouth/Throat:     Mouth: Mucous membranes are moist.     Pharynx: Oropharynx is clear. No oropharyngeal exudate or posterior oropharyngeal erythema.  Cardiovascular:     Rate and Rhythm: Normal rate and regular rhythm.     Pulses: Normal pulses.     Heart sounds: No murmur heard.    No friction rub. No gallop.  Pulmonary:     Effort: Pulmonary effort is normal. No respiratory distress.     Breath sounds: No wheezing, rhonchi or rales.  Abdominal:     General: Bowel sounds are normal. There is no distension.     Palpations: Abdomen is soft.     Tenderness: There is no abdominal tenderness.  Musculoskeletal:      Right lower leg: No edema.     Left lower leg: No edema.  Skin:    General: Skin is warm and dry.     Findings: No rash.  Neurological:     Mental Status: She is alert.        Assessment & Plan:   Maxillary sinusitis We will start her on another round of doxycycline  100mg  BID.  She is to continue on flonase  and claritin.  Her COVID-19 test was negative today.  Continue supportive care.    No follow-ups on file.   Wayne Haines, MD

## 2024-02-10 NOTE — Assessment & Plan Note (Addendum)
 We will start her on another round of doxycycline  100mg  BID.  She is to continue on flonase  and claritin.  Her COVID-19 test was negative today.  Continue supportive care.

## 2024-02-17 IMAGING — CT CT CARDIAC CORONARY ARTERY CALCIUM SCORE
3 series · 14 of 20 positions shown, 16 images · non-contrast
Comparison: 03/17/2005 chest radiograph. Chest CT report
12/07/2001.
COMPARISON: 03/17/2005 chest radiograph. Chest CT report
12/07/2001.

Addendum:
EXAM:
OVER-READ INTERPRETATION  CT CHEST

The following report is a limited chest CT over-read performed by
03/05/2022. This over-read does not include interpretation of cardiac
or coronary anatomy or pathology. The calcium score interpretation
by the cardiologist is attached.
CLINICAL DATA: Cardiovascular Disease Risk stratification
Coronary Calcium Score
TECHNIQUE: A gated, non-contrast computed tomography scan of the heart was
performed using 3mm slice thickness. Axial images were analyzed on a
dedicated workstation. Calcium scoring of the coronary arteries was
performed using the Agatston method.

[Series 2: ax lung · axial · 0.70mm/px · z∈[+1220,+1310]mm · 5 of 69 slices shown]
[im 12/69  lung]
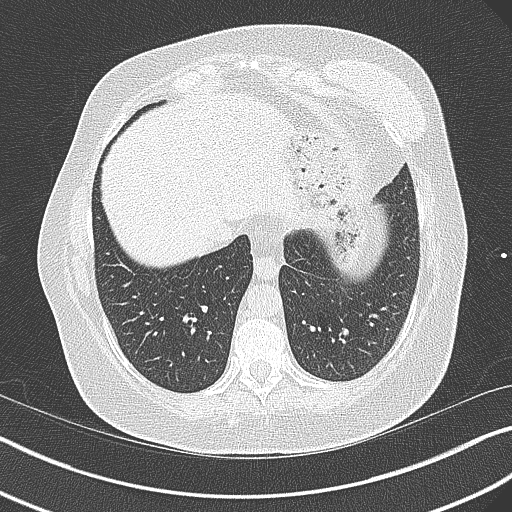
[im 23/69  lung]
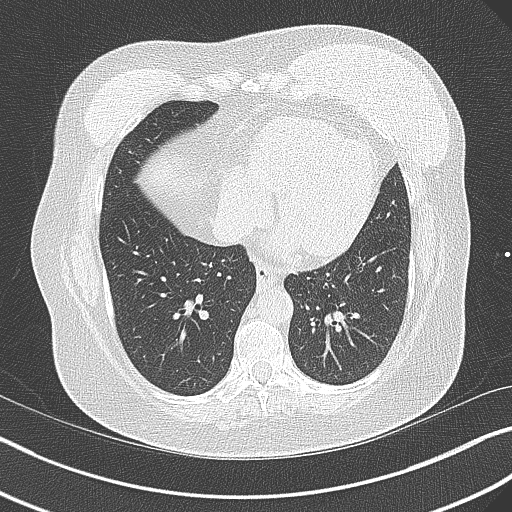
[im 35/69  lung]
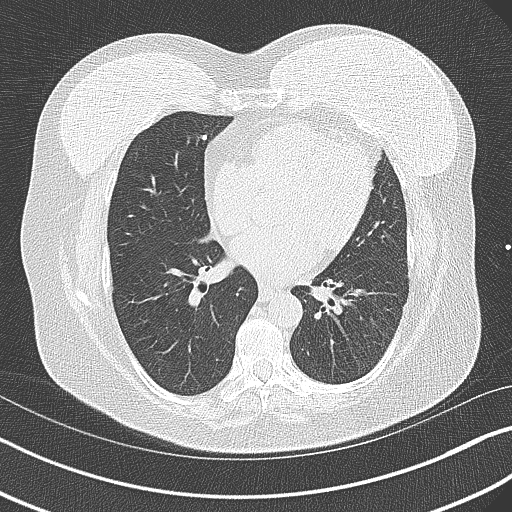
[im 46/69  lung]
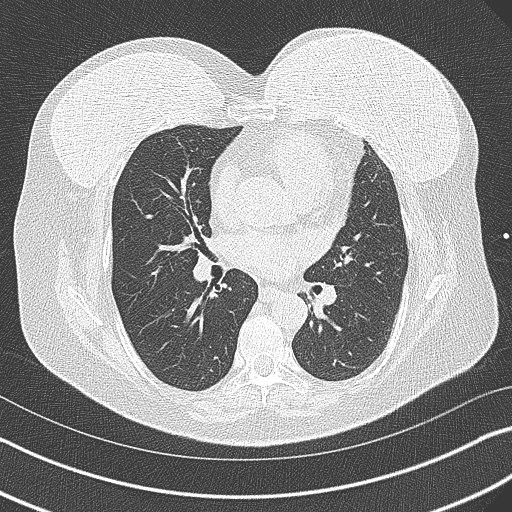
[im 57/69  lung]
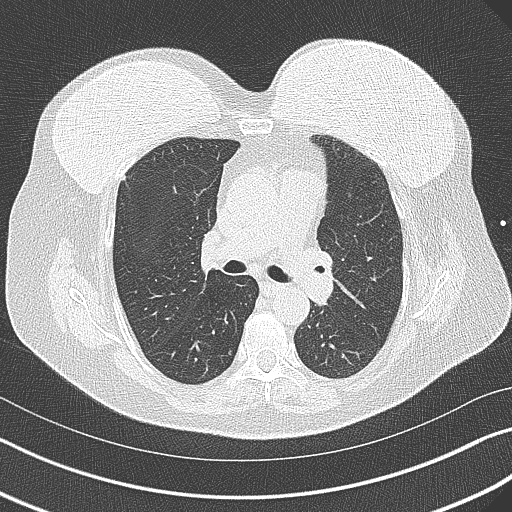

[Series 3: cascseq 3.0 sa36 70% (id) · axial · 0.39mm/px · z∈[+1231,+1297]mm · 3 of 46 slices shown]
[im 12/46  vessel]
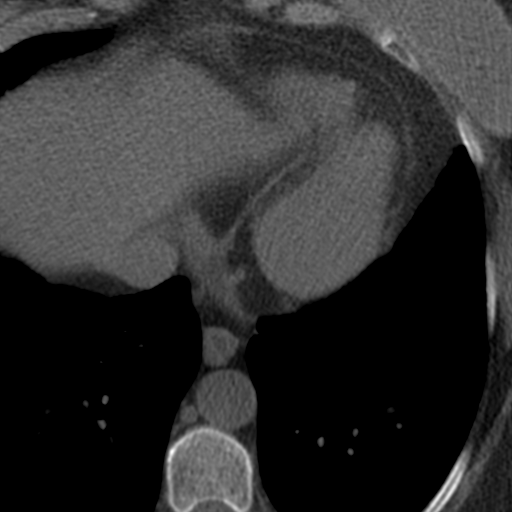
[im 23/46  vessel]
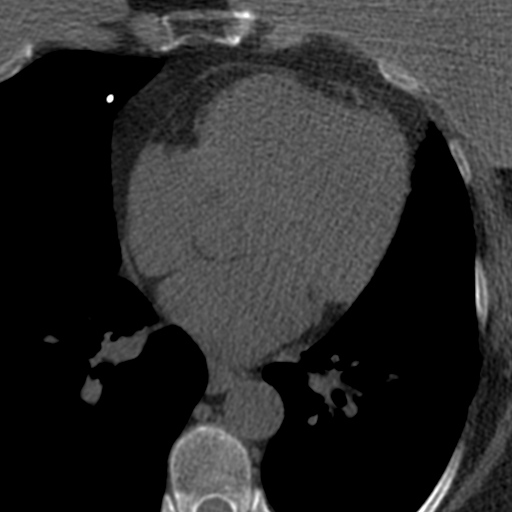
[im 34/46  vessel]
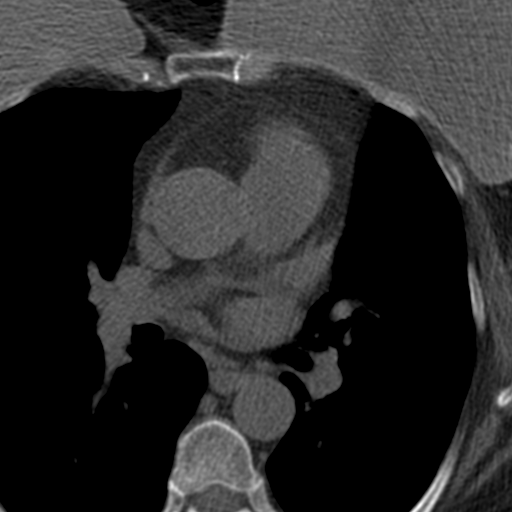

[Series 4: ax st · axial · 0.63mm/px · z∈[+1216,+1314]mm · 6 of 69 slices shown, 8 images]
[im 10/69  vessel]
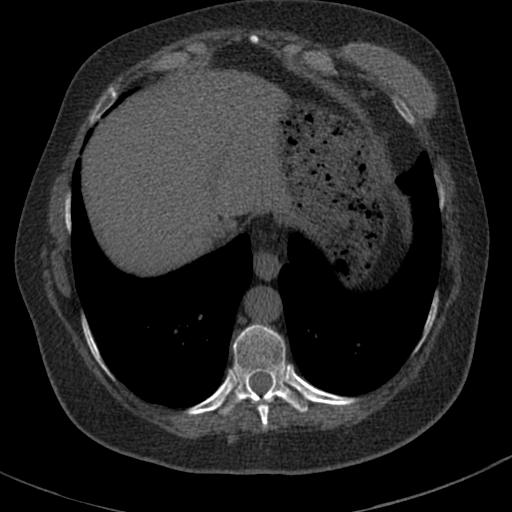
[im 10/69  lung]
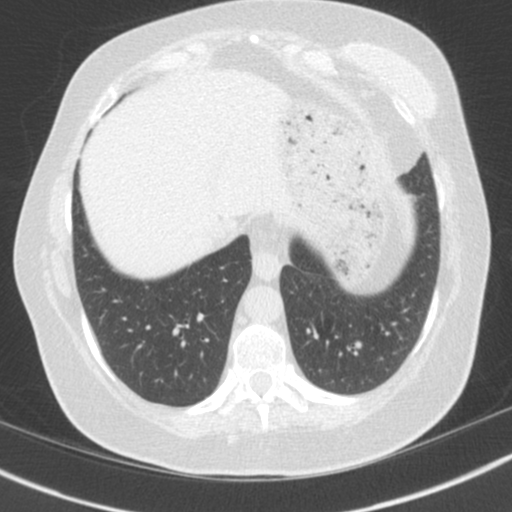
[im 20/69  vessel]
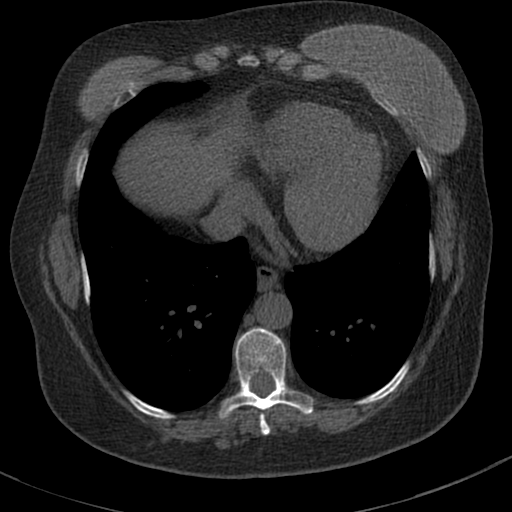
[im 30/69  vessel]
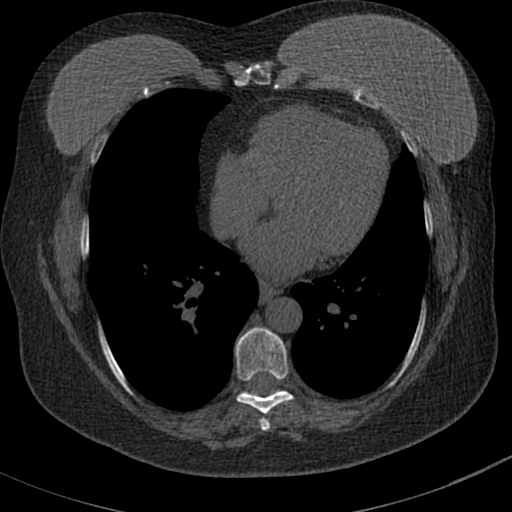
[im 39/69  vessel]
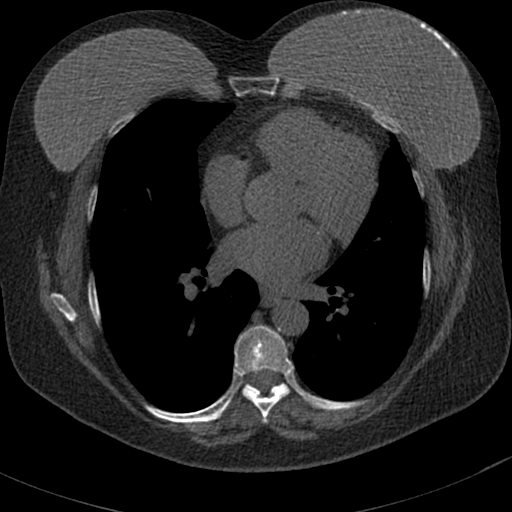
[im 49/69  vessel]
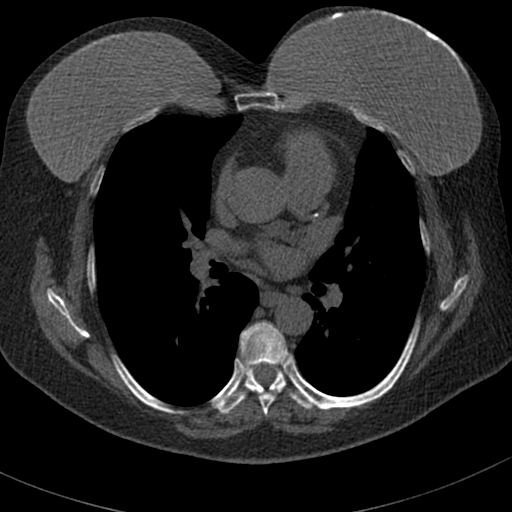
[im 49/69  lung]
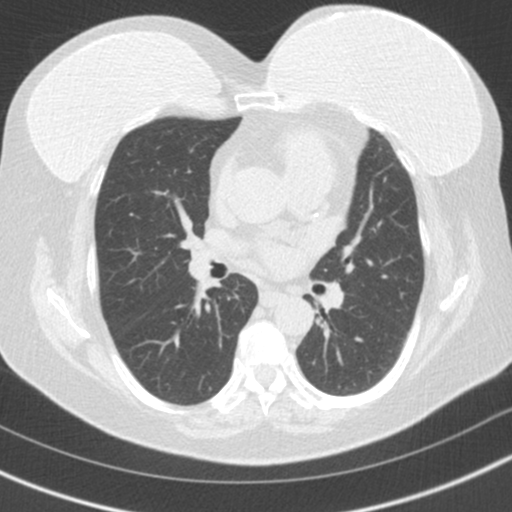
[im 59/69  vessel]
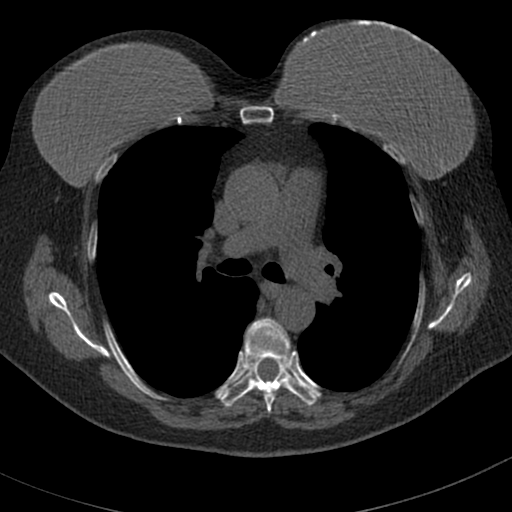

[14 of 20 positions shown; findings below may reference images not displayed]

FINDINGS: Vascular: Aortic atherosclerosis.

Mediastinum/Nodes: No imaged thoracic adenopathy. Tiny hiatal
hernia.

Lungs/Pleura: No pleural fluid. Medial right middle lobe calcified
granulomas x2.

Upper Abdomen: Normal imaged portions of the liver, spleen, stomach.

Musculoskeletal: Bilateral breast implants.  Osteopenia.
IMPRESSION: 1. No acute findings in the imaged extracardiac chest.
2. Aortic Atherosclerosis (O81W7-E01.1).
3.  Tiny hiatal hernia.
FINDINGS: Coronary arteries: Normal origins.

Coronary Calcium Score:

Left main: 0

Left anterior descending artery: 30

Left circumflex artery: 2

Right coronary artery: 0

Total: 32

Percentile: 65th

Pericardium: Normal.

Aorta: Normal caliber of ascending aorta. No aortic atherosclerosis
noted.

Non-cardiac: See separate report from [REDACTED].
IMPRESSION: Coronary calcium score of 32. This was 65th percentile for age-,
race-, and sex-matched controls.



If CAC=0, it is reasonable to withhold statin therapy and reassess
in 5 to 10 years, as long as higher risk conditions are absent
(diabetes mellitus, family history of premature CHD in first degree
relatives (males <55 years; females <65 years), cigarette smoking,
or LDL >=190 mg/dL).

If CAC is 1 to 99, it is reasonable to initiate statin therapy for
patients >=55 years of age.

If CAC is >=100 or >=75th percentile, it is reasonable to initiate
statin therapy at any age.

Cardiology referral should be considered for patients with CAC
scores >=400 or >=75th percentile.

*2981 AHA/ACC/AACVPR/AAPA/ABC/WINTER/ANGGRAENI/DIEP/Win/GUSTTAVO/FRROK LIZE/WAGNER
Guideline on the Management of Blood Cholesterol: A Report of the
American College of Cardiology/American Heart Association Task Force
on Clinical Practice Guidelines. J Am Coll Cardiol.
2515;73(24):7009-7560.

*** End of Addendum ***
EXAM:
OVER-READ INTERPRETATION  CT CHEST

The following report is a limited chest CT over-read performed by
03/05/2022. This over-read does not include interpretation of cardiac
or coronary anatomy or pathology. The calcium score interpretation
by the cardiologist is attached.
FINDINGS: Vascular: Aortic atherosclerosis.

Mediastinum/Nodes: No imaged thoracic adenopathy. Tiny hiatal
hernia.

Lungs/Pleura: No pleural fluid. Medial right middle lobe calcified
granulomas x2.

Upper Abdomen: Normal imaged portions of the liver, spleen, stomach.

Musculoskeletal: Bilateral breast implants.  Osteopenia.
IMPRESSION: 1. No acute findings in the imaged extracardiac chest.
2. Aortic Atherosclerosis (O81W7-E01.1).
3.  Tiny hiatal hernia.

## 2024-02-24 DIAGNOSIS — J301 Allergic rhinitis due to pollen: Secondary | ICD-10-CM | POA: Diagnosis not present

## 2024-03-07 ENCOUNTER — Encounter: Payer: Self-pay | Admitting: Dermatology

## 2024-03-07 ENCOUNTER — Ambulatory Visit (INDEPENDENT_AMBULATORY_CARE_PROVIDER_SITE_OTHER): Admitting: Dermatology

## 2024-03-07 VITALS — BP 138/91

## 2024-03-07 DIAGNOSIS — L309 Dermatitis, unspecified: Secondary | ICD-10-CM

## 2024-03-07 DIAGNOSIS — L821 Other seborrheic keratosis: Secondary | ICD-10-CM | POA: Diagnosis not present

## 2024-03-07 DIAGNOSIS — Z7189 Other specified counseling: Secondary | ICD-10-CM

## 2024-03-07 DIAGNOSIS — R238 Other skin changes: Secondary | ICD-10-CM

## 2024-03-07 DIAGNOSIS — I878 Other specified disorders of veins: Secondary | ICD-10-CM | POA: Diagnosis not present

## 2024-03-07 MED ORDER — TRIAMCINOLONE ACETONIDE 0.1 % EX OINT: TOPICAL_OINTMENT | CUTANEOUS | 5 refills | Status: DC

## 2024-03-07 NOTE — Progress Notes (Signed)
 New Patient Visit   Subjective  Brennyn Ortlieb Hritz is a 68 y.o. female who presents for the following: New Pt - Rash  Patient states she has lesion on inside of her lip and a rash on the back that has been present for about 75mo & a rash on B/L legs that started 2-3days ago that she would like to have examined. .The areas are not consistently itchy but when it does she rates irritation 5 out of 10. Ares have not spread. Patient reports she has previously been treated for these areas by PCP. Rx [facility] that she is using PRN.  The patient has spots, moles and lesions to be evaluated, some may be new or changing and the patient may have concern these could be cancer.   The following portions of the chart were reviewed this encounter and updated as appropriate: medications, allergies, medical history  Review of Systems:  No other skin or systemic complaints except as noted in HPI or Assessment and Plan.  Objective  Well appearing patient in no apparent distress; mood and affect are within normal limits.   A focused examination was performed of the following areas: back & B/L legs   Relevant exam findings are noted in the Assessment and Plan.             Assessment & Plan    1. 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 Asymptomatic and unchanged in size. Examination reveals a 5 millimeter purple papule on the lower mucosal wet-dry border, consistent with a venous lake. This is a benign vascular lesion that does not require immediate intervention. - Plan:    Reassure patient about benign nature of the lesion    Observe for any changes in size or symptoms    Discuss option for cauterization if lesion becomes problematic in the future  2. Dermatitis on left lower extremity - Assessment:  Patient presents with a rash on her leg that appeared after yard work, similar to an episode last year. Examination reveals erythematous  papules coalescing into ill-defined plaques and a few larger isolated papules, predominantly on the left lower extremity. Consistent with irritant dermatitis.  - Plan:    Prescribe topical steroid cream     - Apply twice daily for 2 weeks, then discontinue for 2 weeks     - Repeat cycle as needed    Advise patient to massage cream into affected areas    Recommend elevating legs when relaxing to improve blood flow    Follow up in 2-4 weeks to assess improvement  3. Seborrheic keratosis on back - Assessment:  Patient reports irritation on her back. Examination reveals a lesion consistent with seborrheic keratosis, described as a wisdom spot. These are benign, age-related growths that do not require immediate intervention.  - Plan:    Reassure patient about benign nature of the lesion    Advise patient to avoid irritation of the area    Discuss option for cryotherapy with liquid nitrogen if lesion becomes problematic  4. Routine skin cancer screening - Assessment:  Patient's last full body skin examination was approximately 3 years ago. Given the patient's age and history of outdoor activities, a comprehensive skin cancer screening is warranted.  - Plan:    Schedule full body skin examination for end of summer or fall    Provide patient with sunscreen samples for sun protection    Educate patient on  importance of regular sunscreen application, especially during outdoor activities   Return for TBSE .    Documentation: I have reviewed the above documentation for accuracy and completeness, and I agree with the above.  I, Shirron Louanne Roussel, CMA, am acting as scribe for [employer], DO.   Louana Roup, DO

## 2024-03-07 NOTE — Patient Instructions (Addendum)
 Date: Mon Mar 07 2024  Hello [place],  Thank you for visiting today. Here is a summary of the key instructions:  - Medications:   - Apply Triamcinolone  0.1%  cream to rash on leg twice a day   - Use for 2 weeks, then take a 2-week break   - Repeat this cycle as needed  - Skin Care:   - Keep legs elevated when relaxing to improve blood flow   - Reapply sunscreen regularly when outdoors  - Follow-up:   - Schedule a full head-to-toe skin check in late summer or fall  We look forward to seeing you at your next visit. If you have any questions or concerns before then, please do not hesitate to contact our office.  Warm regards,  Dr. Louana Roup Dermatology Important Information  Due to recent changes in healthcare laws, you may see results of your pathology and/or laboratory studies on MyChart before the doctors have had a chance to review them. We understand that in some cases there may be results that are confusing or concerning to you. Please understand that not all results are received at the same time and often the doctors may need to interpret multiple results in order to provide you with the best plan of care or course of treatment. Therefore, we ask that you please give us  2 business days to thoroughly review all your results before contacting the office for clarification. Should we see a critical lab result, you will be contacted sooner.   If You Need Anything After Your Visit  If you have any questions or concerns for your doctor, please call our main line at [phone] If no one answers, please leave a voicemail as directed and we will return your call as soon as possible. Messages left after 4 pm will be answered the following business day.   You may also send us  a message via MyChart. We typically respond to MyChart messages within 1-2 business days.  For prescription refills, please ask your pharmacy to contact our office. Our fax number is [phone].  If you have  an urgent issue when the clinic is closed that cannot wait until the next business day, you can page your doctor at the number below.    Please note that while we do our best to be available for urgent issues outside of office hours, we are not available 24/7.   If you have an urgent issue and are unable to reach us , you may choose to seek medical care at your doctor's office, retail clinic, urgent care center, or emergency room.  If you have a medical emergency, please immediately call 911 or go to the emergency department. In the event of inclement weather, please call our main line at [phone] for an update on the status of any delays or closures.  Dermatology Medication Tips: Please keep the boxes that topical medications come in in order to help keep track of the instructions about where and how to use these. Pharmacies typically print the medication instructions only on the boxes and not directly on the medication tubes.   If your medication is too expensive, please contact our office at [phone] or send us  a message through MyChart.   We are unable to tell what your co-pay for medications will be in advance as this is different depending on your insurance coverage. However, we may be able to find a substitute medication at lower cost or fill out paperwork to get insurance to cover a  needed medication.   If a prior authorization is required to get your medication covered by your insurance company, please allow us  1-2 business days to complete this process.  Drug prices often vary depending on where the prescription is filled and some pharmacies may offer cheaper prices.  The website www.goodrx.com contains coupons for medications through different pharmacies. The prices here do not account for what the cost may be with help from insurance (it may be cheaper with your insurance), but the website can give you the price if you did not use any insurance.  - You can print the associated  coupon and take it with your prescription to the pharmacy.  - You may also stop by our office during regular business hours and pick up a GoodRx coupon card.  - If you need your prescription sent electronically to a different pharmacy, notify our office through [facility] or by phone at [phone]

## 2024-03-09 DIAGNOSIS — J301 Allergic rhinitis due to pollen: Secondary | ICD-10-CM | POA: Diagnosis not present

## 2024-03-10 ENCOUNTER — Ambulatory Visit: Admitting: Internal Medicine

## 2024-03-15 ENCOUNTER — Encounter: Payer: Self-pay | Admitting: Internal Medicine

## 2024-03-15 ENCOUNTER — Ambulatory Visit: Admitting: Internal Medicine

## 2024-03-15 VITALS — BP 118/72 | HR 67 | Temp 98.2°F | Resp 18 | Ht 68.0 in | Wt 175.4 lb

## 2024-03-15 DIAGNOSIS — M609 Myositis, unspecified: Secondary | ICD-10-CM | POA: Diagnosis not present

## 2024-03-15 DIAGNOSIS — Z789 Other specified health status: Secondary | ICD-10-CM

## 2024-03-15 DIAGNOSIS — R7303 Prediabetes: Secondary | ICD-10-CM

## 2024-03-15 DIAGNOSIS — T466X5A Adverse effect of antihyperlipidemic and antiarteriosclerotic drugs, initial encounter: Secondary | ICD-10-CM

## 2024-03-15 DIAGNOSIS — E782 Mixed hyperlipidemia: Secondary | ICD-10-CM | POA: Diagnosis not present

## 2024-03-15 DIAGNOSIS — I10 Essential (primary) hypertension: Secondary | ICD-10-CM

## 2024-03-15 DIAGNOSIS — M810 Age-related osteoporosis without current pathological fracture: Secondary | ICD-10-CM | POA: Diagnosis not present

## 2024-03-15 NOTE — Assessment & Plan Note (Signed)
 Plan as below.

## 2024-03-15 NOTE — Assessment & Plan Note (Signed)
-  As above.

## 2024-03-15 NOTE — Progress Notes (Signed)
 Office Visit  Subjective   Patient ID: Haley Beasley   DOB: 02-Apr-1956   Age: 68 y.o.   MRN: [id number]   Chief Complaint Chief Complaint  Patient presents with   Follow-up     History of Present Illness The patient returns for followup of her cholesterol.  She unfortunately had a stroke where she was hospitalized at [facility] in 09/2023.  Her goal LDL <70.  She was sent home on ASA 81mg  daily and plavix  75mg  daily (x3 weeks).  Her LDL at that time was 139 in the hospital.   The patient does have an allergy to statins where she has been on lipitor, zocor and crestor and they all caused myalgias.  She is currently followed by the [facility] with her last visit in 06/2022. Overall, she states she is doing well and is without any complaints or problems at this time. She specifically denies chest pain, abdominal pain, nausea, diarrhea, fatigue or myalgias. She remains on dietary management as well as the following cholesterol lowering medications: Repatha  140mg  twice a month. Her neuorlogist added zetia  to her regimen in 11/2023 where she is on both repatha  and zetia . She is intolerant to statins simvastatin, crestor, atorvastatin , pravastatin which all caused myalgias.  The patient is currently fasting for her labs today.   The patient is a 68 year old female who presents for a follow-up evaluation of hypertension.  She was diagnosed with HTN in 2020.  Since her last visit, she has not had any problems.  The patient has been checking her blood pressure at home.  She states her systolic BP is in the 120's.  The patient's current medications include: losartan  25 mg daily. The patient has been tolerating her medications well. The patient denies any visual changes, dizziness, headaches, chest pain, lightheadness, palpitations, generalized weakness or edema. She reports there have been no other symptoms noted.  She does not smoke.  The patient also returns for followup of her prediabetes.  She was  noted to be prediabetic on yearly labs in 04/2023 where her HgBA1c was incidentally found to be 5.9%.  She is currently not on any medications and I asked her to control this with diet and exercise.  Today, she denies any abdominal pain, nausea, vomiting or other problems.  She does not routinely check her FSBS.  Her last HgBA1c was done in 09/2023 and was 5.9%.  The patient is a 68 year old female , post-menopausal, who has a history of osteoporosis. She tells me that her last bone density in [place]  showed she had osteopenia. We did a bone density on 09/04/2022 which showed a lumbar spine t-score of -2.6 where she was diagnosed with osteoporosis. I asked her to start on aldendronate 70mg  weekly which she states her reflux has worsened. We therefore placed her on iv reclast . She has no specific complaints related to bone loss.  She has had an ankle fracture in 01/2023. She reports a family history of osteoporosis. The following risk factors are noted: history of tobacco abuse (quit in 2009), She denies the following: diabetes mellitus, high caffeine intake, alcohol consumption of more that 7 ounces per week, daily prednisone  use, hyperthyroidism, surgical resection of her bowel, and surgical resection of her stomach. She states she exercises routinely with walking and biking. I also asked her to start on calcium  and Vit D but the patient states she gets severe constipation from calcium  and even VIt D supplementation. I did discuss medications  with her and I wanted her to stop the alendronate  as it is causing worsening reflux. We discussed prolia vs. Reclast  and possible side effects. We set her up for iv reclast  with her first dose done on 10/29/2022 and her 2nd dose done on 02/01/2024.         Past Medical History Past Medical History:  Diagnosis Date   Allergy 2000   Anxiety    Breast cancer (HCC)    Cataract 2011   Depression    GERD (gastroesophageal reflux disease)    Hyperlipidemia    Hypertension     Hypothyroidism    Memory loss    OSA (obstructive sleep apnea)    Osteoporosis 2009   Sleep apnea 2015   Stroke (HCC) 10/03/2023     Allergies Allergies  Allergen Reactions   Fluogen [Influenza Virus Vaccine]    Penicillins      Medications  Current Outpatient Medications:    ALPRAZolam  (XANAX ) 0.5 MG tablet, Take 1 tablet (0.5 mg total) by mouth daily as needed for sleep or anxiety., Disp: 30 tablet, Rfl: 2   aspirin  EC 81 MG tablet, Take 1 tablet (81 mg total) by mouth daily. Swallow whole., Disp: 30 tablet, Rfl: 12   doxycycline  (MONODOX ) 100 MG capsule, Take 1 capsule (100 mg total) by mouth 2 (two) times daily., Disp: 20 capsule, Rfl: 0   Evolocumab  (REPATHA  SURECLICK) 140 MG/ML SOAJ, Inject 140 mg into the skin every 14 (fourteen) days., Disp: 6 mL, Rfl: 3   ezetimibe  (ZETIA ) 10 MG tablet, TAKE 1 TABLET(10 MG) BY MOUTH DAILY, Disp: 30 tablet, Rfl: 1   fluticasone  (FLONASE ) 50 MCG/ACT nasal spray, SHAKE LIQUID AND USE 1 SPRAY IN EACH NOSTRIL DAILY, Disp: 16 g, Rfl: 3   losartan  (COZAAR ) 25 MG tablet, TAKE 1 TABLET(25 MG) BY MOUTH DAILY, Disp: 90 tablet, Rfl: 1   pantoprazole  (PROTONIX ) 40 MG tablet, Take 1 tablet (40 mg total) by mouth daily., Disp: 90 tablet, Rfl: 0   sertraline  (ZOLOFT ) 25 MG tablet, Take 1 tablet (25 mg total) by mouth daily., Disp: 90 tablet, Rfl: 3   SYNTHROID  75 MCG tablet, TAKE 1 TABLET(75 MCG) BY MOUTH DAILY, Disp: 60 tablet, Rfl: 2   triamcinolone  ointment (KENALOG ) 0.1 %, use BID for 2 weeks on affected areas, then break for 2 weeks. Repeat PRN., Disp: 80 g, Rfl: 5   zaleplon (SONATA) 10 MG capsule, Take 10 mg by mouth at bedtime., Disp: , Rfl:    Review of Systems Review of Systems  Constitutional:  Negative for chills, fever and malaise/fatigue.  Eyes:  Negative for blurred vision and double vision.  Respiratory:  Negative for cough and shortness of breath.   Cardiovascular:  Negative for chest pain, palpitations and leg swelling.   Gastrointestinal:  Negative for abdominal pain, constipation, diarrhea, heartburn, nausea and vomiting.  Genitourinary:  Negative for frequency.  Musculoskeletal:  Negative for myalgias.  Skin:  Negative for rash.  Neurological:  Negative for dizziness, weakness and headaches.  Endo/Heme/Allergies:  Negative for polydipsia.       Objective:    Vitals BP 118/72   Pulse 67   Temp 98.2 F (36.8 C)   Resp 18   Ht 5' 8 (1.727 m)   Wt 175 lb 6.4 oz (79.6 kg)   SpO2 97%   BMI 26.67 kg/m    Physical Examination Physical Exam Constitutional:      Appearance: Normal appearance. She is not ill-appearing.   Cardiovascular:  Rate and Rhythm: Normal rate and regular rhythm.     Pulses: Normal pulses.     Heart sounds: No murmur heard.    No friction rub. No gallop.  Pulmonary:     Effort: Pulmonary effort is normal. No respiratory distress.     Breath sounds: No wheezing, rhonchi or rales.  Abdominal:     General: Bowel sounds are normal. There is no distension.     Palpations: Abdomen is soft.     Tenderness: There is no abdominal tenderness.   Musculoskeletal:     Right lower leg: No edema.     Left lower leg: No edema.   Skin:    General: Skin is warm and dry.     Findings: No rash.   Neurological:     General: No focal deficit present.     Mental Status: She is alert and oriented to person, place, and time.   Psychiatric:        Mood and Affect: Mood normal.        Behavior: Behavior normal.        Assessment & Plan:   Essential hypertension Her BP is well controlled.  We will continue her on losartan .  Age-related osteoporosis without current pathological fracture She just had her 2nd dose of iv reclast .  We will do another dose in 01/2025 and repeat a bone density later next year.  Continue with weight bearing exercises.  Statin-induced myositis Plan as below.  Hyperlipidemia Her last LDL was 75 and we will check her FLP on her next visit.   I did  check a apolipoprotein B on her last visit and this was normal.  Prediabetes We will check her HgBa1c on her next visit which will be her yearly exam.  Continue with diet and exercise.  Statin intolerance As above.    Return in about 3 months (around 06/15/2024) for annual.   Selinda Fleeta Finger, MD

## 2024-03-15 NOTE — Assessment & Plan Note (Signed)
 Her BP is well controlled.  We will continue her on losartan .

## 2024-03-15 NOTE — Assessment & Plan Note (Signed)
 Her last LDL was 75 and we will check her FLP on her next visit.   I did check a apolipoprotein B on her last visit and this was normal.

## 2024-03-15 NOTE — Assessment & Plan Note (Signed)
 She just had her 2nd dose of iv reclast .  We will do another dose in 01/2025 and repeat a bone density later next year.  Continue with weight bearing exercises.

## 2024-03-15 NOTE — Assessment & Plan Note (Signed)
 We will check her HgBa1c on her next visit which will be her yearly exam.  Continue with diet and exercise.

## 2024-03-21 DIAGNOSIS — Z87891 Personal history of nicotine dependence: Secondary | ICD-10-CM | POA: Diagnosis not present

## 2024-03-21 DIAGNOSIS — G4700 Insomnia, unspecified: Secondary | ICD-10-CM | POA: Diagnosis not present

## 2024-03-21 DIAGNOSIS — G4733 Obstructive sleep apnea (adult) (pediatric): Secondary | ICD-10-CM | POA: Diagnosis not present

## 2024-03-21 DIAGNOSIS — J301 Allergic rhinitis due to pollen: Secondary | ICD-10-CM | POA: Diagnosis not present

## 2024-03-23 DIAGNOSIS — J301 Allergic rhinitis due to pollen: Secondary | ICD-10-CM | POA: Diagnosis not present

## 2024-03-24 ENCOUNTER — Other Ambulatory Visit: Payer: Self-pay | Admitting: Internal Medicine

## 2024-03-28 ENCOUNTER — Telehealth: Payer: Self-pay | Admitting: Neurology

## 2024-03-28 NOTE — Telephone Encounter (Signed)
 I contacted pt back, she stated in the last 5-6 days she has been losing her balance and having increase in dizziness. She notices this increase when she is around loud noises as well.  She denies any major changes, no new mediations, has been hydrated during this recent heat wave.  MD did note during last visit She complains of occasional feeling of imbalance particularly when she makes sudden movement. She feels this has worsen. She has been tolerating Zetia  and Repatha  well. I advised best way to rule out another stroke is to go to ED. Advised if symptoms persist or worsen, to go to ED and let us  know. She verbally understood and was appreciative. I have placed pt on wait list with MD. Also informed to manage BP and contact PCP as well.

## 2024-03-28 NOTE — Telephone Encounter (Signed)
 Pt called to request earlier appt . Pt is concern about having another stroke but have notice that for 5-6 days pt has been losing her balance  almost every time she stands to walk Pt states she doesn't want to have  another stroke  , Pt  wants to see MD sooner

## 2024-04-11 ENCOUNTER — Other Ambulatory Visit: Payer: Self-pay | Admitting: Neurology

## 2024-04-25 DIAGNOSIS — J301 Allergic rhinitis due to pollen: Secondary | ICD-10-CM | POA: Diagnosis not present

## 2024-04-28 DIAGNOSIS — H524 Presbyopia: Secondary | ICD-10-CM | POA: Diagnosis not present

## 2024-05-01 ENCOUNTER — Other Ambulatory Visit: Payer: Self-pay | Admitting: Internal Medicine

## 2024-05-04 ENCOUNTER — Other Ambulatory Visit: Payer: Self-pay | Admitting: Internal Medicine

## 2024-05-04 MED ORDER — ALPRAZOLAM 0.5 MG PO TABS: 0.5000 mg | ORAL_TABLET | Freq: Every day | ORAL | 2 refills | Status: DC | PRN

## 2024-05-09 DIAGNOSIS — J301 Allergic rhinitis due to pollen: Secondary | ICD-10-CM | POA: Diagnosis not present

## 2024-05-11 DIAGNOSIS — J301 Allergic rhinitis due to pollen: Secondary | ICD-10-CM | POA: Diagnosis not present

## 2024-06-02 NOTE — Progress Notes (Unsigned)
 [facility]  [address] [place],  [place]  [address] [phone]  Clinic Day:  06/04/2023  Referring physician: Fleeta Valeria Mayo, MD   HISTORY OF PRESENT ILLNESS:  AMIREE NO is a 68 y.o. female who I was asked to consult upon in order to maintain her breast cancer surveillance.  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  This led to her undergoing a left breast lumpectomy, followed by adjuvant breast radiation.  In 2009, despite having a recently normal mammogram, the patient felt a large, painful left breast mass.  This led to an ultrasound being done, which showed a large mass.  This finding led to her desiring to undergo a bilateral mastectomy, which was then followed by reconstructive breast surgery.  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  All of this was done in the [employer] area.  Fortunately, studies and exams since then have shown no evidence of disease recurrence.  The patient claims she has not seen an oncologist in over 3 years.  She denies having any new symptoms or findings which concern her for late disease recurrence.  She underwent genetic testing approximately in May 2023 due to her strong family history of cancer.  Fortunately, her genetic test results came back showing no pathogenic mutations.    PAST MEDICAL HISTORY:   Past Medical History:  Diagnosis Date  . Allergy 2000  . Anxiety   . Breast cancer (HCC)   . Cataract 2011  . Depression   . GERD (gastroesophageal reflux disease)   . Hyperlipidemia   . Hypertension   . Hypothyroidism   . Memory loss   . OSA (obstructive sleep apnea)   . Osteoporosis 2009  . Sleep apnea 2015  . Stroke [facility])  10/03/2023    PAST SURGICAL HISTORY:   Past Surgical History:  Procedure Laterality Date  . ABDOMINAL HYSTERECTOMY    . ANKLE FRACTURE SURGERY Right 01/2023  . BREAST BIOPSY Left   . BREAST RECONSTRUCTION    . CATARACT EXTRACTION Bilateral   . CHOLECYSTECTOMY    . COLONOSCOPY W/ POLYPECTOMY    . ENDOVENOUS ABLATION SAPHENOUS VEIN W/ LASER Right 01/2020   [place]   . ENDOVENOUS ABLATION SAPHENOUS VEIN W/ LASER Left 05/2020   [place]   . EYE SURGERY  2012   Cataract  . FRACTURE SURGERY  01/2023  . LYMPH NODE BIOPSY Left    AXILLARY  . MASTECTOMY Bilateral   . THYROIDECTOMY    . TUBAL LIGATION Bilateral   . WISDOM TOOTH EXTRACTION      CURRENT MEDICATIONS:   Current Outpatient Medications  Medication Sig Dispense Refill  . ALPRAZolam  (XANAX ) 0.5 MG tablet Take 1 tablet (0.5 mg total) by mouth daily as needed for sleep or anxiety. 30 tablet 2  . aspirin  EC 81 MG tablet Take 1 tablet (81 mg total) by mouth daily. Swallow whole. 30 tablet 12  . Evolocumab  (REPATHA  SURECLICK) 140 MG/ML SOAJ Inject 140 mg into the skin every 14 (fourteen) days. 6 mL 3  . ezetimibe  (ZETIA ) 10 MG tablet TAKE 1 TABLET(10 MG) BY MOUTH DAILY 30 tablet 2  . fluticasone  (FLONASE ) 50 MCG/ACT nasal spray SHAKE LIQUID AND USE 1 SPRAY IN EACH NOSTRIL DAILY 16 g 3  . losartan  (  COZAAR ) 25 MG tablet TAKE 1 TABLET(25 MG) BY MOUTH DAILY 90 tablet 1  . pantoprazole  (PROTONIX ) 40 MG tablet TAKE 1 TABLET(40 MG) BY MOUTH DAILY 90 tablet 0  . sertraline  (ZOLOFT ) 25 MG tablet Take 1 tablet (25 mg total) by mouth daily. 90 tablet 3  . SYNTHROID  75 MCG tablet TAKE 1 TABLET(75 MCG) BY MOUTH DAILY 60 tablet 2  . triamcinolone  ointment (KENALOG ) 0.1 % use BID for 2 weeks on affected areas, then break for 2 weeks. Repeat PRN. 80 g 5  . zaleplon (SONATA) 10 MG capsule Take 10 mg by mouth at bedtime.     No current facility-administered medications for this visit.    ALLERGIES:   Allergies  Allergen Reactions  . Fluogen  [Influenza Virus Vaccine]   . Penicillins     FAMILY HISTORY:   Family History  Problem Relation Age of Onset  . Heart failure Mother   . Colon cancer Mother 56  . Arthritis Mother   . Hypertension Mother   . Heart attack Father   . Heart disease Father   . Anxiety disorder Father   . Depression Father   . Heart disease Sister   . Dementia Sister   . Parkinson's disease Sister   . Dementia Sister   . Alzheimer's disease Sister   . Anxiety disorder Sister   . Depression Sister   . Heart disease Brother   . Cancer Brother        UNK PRIMARY  . Hypertension Brother   . Heart attack Brother   . Asthma Brother   . Hypertension Brother   . Renal cancer Brother   . Cancer Brother   . Kidney disease Brother   . Pancreatic cancer Brother   . Cancer Brother   . Asthma Brother   . Lung cancer Brother   . Cancer Brother   . Brain cancer Maternal Uncle        d 27s  . Renal cancer Paternal Uncle        d 65s  . Heart disease Son 62       CHF  . Heart attack Son   . Breast cancer Cousin        dx 12s  . Breast cancer Cousin        1 dx 30s-40s, 2 dx 51s  . Breast cancer Cousin   . Leukemia Cousin   . Cancer Half-Brother        UNK PRIMARY  . Anxiety disorder Brother   . Depression Brother   . Hypertension Sister   . Hypertension Sister   . Hypertension Sister   . Hypertension Sister    SOCIAL HISTORY:  The patient was born and raised in [place], [place] .  She currently lives in town with her husband of 49 years.  They have 2 children, 4 grandchildren, and 3 great-grandchildren.  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  She smoked a pack of cigarettes daily for 25 years before quitting 15 years ago.  She drinks alcohol on rare occasions.  REVIEW OF SYSTEMS:  Review of Systems  Constitutional:  Negative for fatigue and fever.  HENT:   Negative for hearing loss and sore throat.   Eyes:  Negative for eye problems.  Respiratory:  Negative  for chest tightness, cough and hemoptysis.   Cardiovascular:  Negative for chest pain and palpitations.  Gastrointestinal:  Negative for abdominal distention, abdominal pain, blood in stool, constipation, diarrhea, nausea  and vomiting.  Endocrine: Negative for hot flashes.  Genitourinary:  Negative for difficulty urinating, dysuria, frequency, hematuria and nocturia.   Musculoskeletal:  Negative for arthralgias, back pain, gait problem and myalgias.  Skin:  Positive for rash. Negative for itching.  Neurological: Negative.  Negative for dizziness, extremity weakness, gait problem, headaches, light-headedness and numbness.  Hematological: Negative.   Psychiatric/Behavioral:  Positive for depression. Negative for suicidal ideas. The patient is nervous/anxious.     PHYSICAL EXAM:  There were no vitals taken for this visit. Wt Readings from Last 3 Encounters:  03/15/24 175 lb 6.4 oz (79.6 kg)  02/10/24 176 lb 6.4 oz (80 kg)  12/21/23 182 lb (82.6 kg)   There is no height or weight on file to calculate BMI. Performance status (ECOG): 0 - Asymptomatic Physical Exam Constitutional:      Appearance: Normal appearance.  HENT:     Mouth/Throat:     Pharynx: Oropharynx is clear. No oropharyngeal exudate.  Cardiovascular:     Rate and Rhythm: Normal rate and regular rhythm.     Heart sounds: No murmur heard.    No friction rub. No gallop.  Pulmonary:     Breath sounds: Normal breath sounds.  Chest:  Breasts:    Right: Absent. No swelling, bleeding, inverted nipple, mass, nipple discharge or skin change.     Left: Absent. No swelling, bleeding, inverted nipple, mass, nipple discharge or skin change.     Comments: There are no abnormalities seen nor palpated around her bilateral reconstructed breasts Abdominal:     General: Bowel sounds are normal. There is no distension.     Palpations: Abdomen is soft. There is no mass.     Tenderness: There is no abdominal tenderness.  Musculoskeletal:         General: No tenderness.     Cervical back: Normal range of motion and neck supple.     Right lower leg: No edema.     Left lower leg: No edema.  Lymphadenopathy:     Cervical: No cervical adenopathy.     Right cervical: No superficial, deep or posterior cervical adenopathy.    Left cervical: No superficial, deep or posterior cervical adenopathy.     Upper Body:     Right upper body: No supraclavicular or axillary adenopathy.     Left upper body: No supraclavicular or axillary adenopathy.     Lower Body: No right inguinal adenopathy. No left inguinal adenopathy.  Skin:    Coloration: Skin is not jaundiced.     Findings: No lesion or rash.  Neurological:     General: No focal deficit present.     Mental Status: She is alert and oriented to person, place, and time. Mental status is at baseline.  Psychiatric:        Mood and Affect: Mood normal.        Behavior: Behavior normal.        Thought Content: Thought content normal.        Judgment: Judgment normal.    LABS:      Latest Ref Rng & Units 10/03/2023   10:18 AM 10/03/2023   10:16 AM 05/08/2023   10:05 AM  CBC  WBC 4.0 - 10.5 K/uL  7.2  5.5   Hemoglobin 12.0 - 15.0 g/dL 86.3  86.9  87.1   Hematocrit 36.0 - 46.0 % 40.0  38.5  39.9   Platelets 150 - 400 K/uL  335  346  Latest Ref Rng & Units 01/20/2024   11:01 AM 12/23/2023    9:29 AM 10/03/2023   10:18 AM  CMP  Glucose 70 - 99 mg/dL 870  83  891   BUN 8 - 27 mg/dL 18  16  15    Creatinine 0.57 - 1.00 mg/dL 9.20  9.24  9.29   Sodium 134 - 144 mmol/L 140  141  137   Potassium 3.5 - 5.2 mmol/L 4.0  4.4  3.6   Chloride 96 - 106 mmol/L 103  103  105   CO2 20 - 29 mmol/L 21  21    Calcium  8.7 - 10.3 mg/dL 9.2  9.5    Total Protein 6.0 - 8.5 g/dL 6.7     Total Bilirubin 0.0 - 1.2 mg/dL 0.3     Alkaline Phos 44 - 121 IU/L 60     AST 0 - 40 IU/L 11     ALT 0 - 32 IU/L 9       ASSESSMENT & PLAN:  MERIDA ALCANTAR is a 68 y.o. female who I was asked to consult upon  with respect to continuing her breast cancer surveillance/management.  As mentioned previously, she was initially diagnosed with early stage hormone receptor negative breast cancer in her left breast in 2006. 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  Based upon her physical exam today, the patient remains disease-free.  Clinically, the patient appears to be doing very well.  In clinic today, I emphasized to the patient that the likelihood of developing late disease recurrence is very low.  Usually, aggressive HER2 positive breast cancer does not wait 15+ years to resurface.  The patient felt reassured by this information.  As she has no pressing breast cancer issues at this time, I will see her back in 1 year for repeat clinical assessment.  The patient understands all the plans discussed today and is in agreement with them.  I do appreciate Fleeta Valeria Mayo, MD for his new consult.   Camarie Mctigue DELENA Kerns, MD

## 2024-06-03 ENCOUNTER — Inpatient Hospital Stay: Payer: Medicare HMO | Attending: Oncology | Admitting: Oncology

## 2024-06-03 VITALS — BP 130/54 | HR 85 | Temp 98.3°F | Resp 16 | Ht 68.0 in | Wt 176.1 lb

## 2024-06-03 DIAGNOSIS — Z171 Estrogen receptor negative status [ER-]: Secondary | ICD-10-CM | POA: Diagnosis not present

## 2024-06-03 DIAGNOSIS — Z9221 Personal history of antineoplastic chemotherapy: Secondary | ICD-10-CM | POA: Diagnosis not present

## 2024-06-03 DIAGNOSIS — C50111 Malignant neoplasm of central portion of right female breast: Secondary | ICD-10-CM | POA: Diagnosis not present

## 2024-06-03 DIAGNOSIS — Z853 Personal history of malignant neoplasm of breast: Secondary | ICD-10-CM | POA: Insufficient documentation

## 2024-06-03 DIAGNOSIS — Z9013 Acquired absence of bilateral breasts and nipples: Secondary | ICD-10-CM | POA: Insufficient documentation

## 2024-06-03 DIAGNOSIS — Z08 Encounter for follow-up examination after completed treatment for malignant neoplasm: Secondary | ICD-10-CM | POA: Diagnosis not present

## 2024-06-03 DIAGNOSIS — Z8673 Personal history of transient ischemic attack (TIA), and cerebral infarction without residual deficits: Secondary | ICD-10-CM | POA: Insufficient documentation

## 2024-06-07 DIAGNOSIS — J301 Allergic rhinitis due to pollen: Secondary | ICD-10-CM | POA: Diagnosis not present

## 2024-06-11 ENCOUNTER — Other Ambulatory Visit: Payer: Self-pay | Admitting: Internal Medicine

## 2024-06-14 DIAGNOSIS — Z87891 Personal history of nicotine dependence: Secondary | ICD-10-CM | POA: Diagnosis not present

## 2024-06-14 DIAGNOSIS — J301 Allergic rhinitis due to pollen: Secondary | ICD-10-CM | POA: Diagnosis not present

## 2024-06-14 DIAGNOSIS — G4733 Obstructive sleep apnea (adult) (pediatric): Secondary | ICD-10-CM | POA: Diagnosis not present

## 2024-06-14 DIAGNOSIS — G4700 Insomnia, unspecified: Secondary | ICD-10-CM | POA: Diagnosis not present

## 2024-06-21 ENCOUNTER — Encounter: Payer: Self-pay | Admitting: Internal Medicine

## 2024-06-21 ENCOUNTER — Ambulatory Visit: Admitting: Internal Medicine

## 2024-06-21 VITALS — BP 122/70 | HR 82 | Temp 98.2°F | Resp 18 | Ht 67.0 in | Wt 176.4 lb

## 2024-06-21 DIAGNOSIS — R0981 Nasal congestion: Secondary | ICD-10-CM

## 2024-06-21 DIAGNOSIS — J0100 Acute maxillary sinusitis, unspecified: Secondary | ICD-10-CM | POA: Insufficient documentation

## 2024-06-21 LAB — POC COVID19 BINAXNOW: SARS Coronavirus 2 Ag: NEGATIVE

## 2024-06-21 MED ORDER — DOXYCYCLINE MONOHYDRATE 100 MG PO CAPS: 100.0000 mg | ORAL_CAPSULE | Freq: Two times a day (BID) | ORAL | 0 refills | Status: DC

## 2024-06-21 NOTE — Assessment & Plan Note (Signed)
 I want her to continue on flonase  and start mucinex DM.  We will start her on doxycycline  at this time.  Her COVID-19 testing was negative today.  Continue supportive care.

## 2024-06-21 NOTE — Progress Notes (Signed)
 Office Visit  Subjective   Patient ID: IKRAN PATMAN   DOB: [birth year]   Age: 68 y.o.   MRN: [id number]   Chief Complaint Chief Complaint  Patient presents with   Nasal Congestion    Sore throat     History of Present Illness Haley Beasley is a 68 yo female who comes in today with an upper respiratory illness.  She began with sinus congestion with yellow green nasal discharge.  This then moved to her chest where she has chest congestion with cough productive of green sputum.  There is post nasal drip with sore throat associated with a pressure headache.  There is no fevers, chills, SOB, wheeinzg, myalgias, nausea, vomiting or diarrhea.  She has been taking tylenol  and flonase .       Past Medical History Past Medical History:  Diagnosis Date   Allergy 2000   Anxiety    Breast cancer (HCC)    Cataract 2011   Depression    GERD (gastroesophageal reflux disease)    Hyperlipidemia    Hypertension    Hypothyroidism    Memory loss    OSA (obstructive sleep apnea)    Osteoporosis 2009   Sleep apnea 2015   Stroke (HCC) 10/03/2023     Allergies Allergies  Allergen Reactions   Fluogen [Influenza Virus Vaccine]    Penicillins      Medications  Current Outpatient Medications:    ALPRAZolam  (XANAX ) 0.5 MG tablet, Take 1 tablet (0.5 mg total) by mouth daily as needed for sleep or anxiety., Disp: 30 tablet, Rfl: 2   aspirin  EC 81 MG tablet, Take 1 tablet (81 mg total) by mouth daily. Swallow whole., Disp: 30 tablet, Rfl: 12   Evolocumab  (REPATHA  SURECLICK) 140 MG/ML SOAJ, Inject 140 mg into the skin every 14 (fourteen) days., Disp: 6 mL, Rfl: 3   ezetimibe  (ZETIA ) 10 MG tablet, TAKE 1 TABLET(10 MG) BY MOUTH DAILY, Disp: 30 tablet, Rfl: 2   fluticasone  (FLONASE ) 50 MCG/ACT nasal spray, SHAKE LIQUID AND USE 1 SPRAY IN EACH NOSTRIL DAILY, Disp: 16 g, Rfl: 3   losartan  (COZAAR ) 25 MG tablet, TAKE 1 TABLET(25 MG) BY MOUTH DAILY, Disp: 90 tablet, Rfl: 1   pantoprazole  (PROTONIX ) 40  MG tablet, TAKE 1 TABLET(40 MG) BY MOUTH DAILY, Disp: 90 tablet, Rfl: 0   sertraline  (ZOLOFT ) 25 MG tablet, Take 1 tablet (25 mg total) by mouth daily., Disp: 90 tablet, Rfl: 3   SYNTHROID  75 MCG tablet, TAKE 1 TABLET(75 MCG) BY MOUTH DAILY, Disp: 60 tablet, Rfl: 2   triamcinolone  ointment (KENALOG ) 0.1 %, use BID for 2 weeks on affected areas, then break for 2 weeks. Repeat PRN., Disp: 80 g, Rfl: 5   zaleplon (SONATA) 10 MG capsule, Take 10 mg by mouth at bedtime., Disp: , Rfl:    Review of Systems Review of Systems  Constitutional:  Negative for chills and fever.  Respiratory:  Positive for cough and sputum production. Negative for shortness of breath and wheezing.   Gastrointestinal:  Negative for abdominal pain, constipation, diarrhea, nausea and vomiting.  Musculoskeletal:  Negative for myalgias.  Skin:  Negative for rash.  Neurological:  Positive for headaches. Negative for dizziness and weakness.       Objective:    Vitals BP 122/70   Pulse 82   Temp 98.2 F (36.8 C)   Resp 18   Ht 5' 7 (1.702 m)   Wt 176 lb 6 oz (80 kg)   SpO2 97%  BMI 27.62 kg/m    Physical Examination Physical Exam Constitutional:      Appearance: Normal appearance. She is not ill-appearing.  HENT:     Right Ear: Tympanic membrane, ear canal and external ear normal.     Left Ear: Tympanic membrane, ear canal and external ear normal.     Mouth/Throat:     Mouth: Mucous membranes are moist.     Pharynx: Oropharynx is clear. No posterior oropharyngeal erythema.  Cardiovascular:     Rate and Rhythm: Normal rate and regular rhythm.     Pulses: Normal pulses.     Heart sounds: No murmur heard.    No friction rub. No gallop.  Pulmonary:     Effort: Pulmonary effort is normal. No respiratory distress.     Breath sounds: No wheezing, rhonchi or rales.  Abdominal:     General: Bowel sounds are normal. There is no distension.     Palpations: Abdomen is soft.     Tenderness: There is no abdominal  tenderness.  Musculoskeletal:     Right lower leg: No edema.     Left lower leg: No edema.  Skin:    General: Skin is warm and dry.     Findings: No rash.  Neurological:     Mental Status: She is alert.        Assessment & Plan:   Acute non-recurrent maxillary sinusitis I want her to continue on flonase  and start mucinex DM.  We will start her on doxycycline  at this time.  Her COVID-19 testing was negative today.  Continue supportive care.    No follow-ups on file.   Selinda Fleeta Finger, MD

## 2024-06-21 NOTE — Addendum Note (Signed)
 Addended by: Harly Pipkins on: 06/21/2024 09:19 AM   Modules accepted: Orders

## 2024-06-22 ENCOUNTER — Other Ambulatory Visit: Payer: Self-pay | Admitting: Internal Medicine

## 2024-07-04 ENCOUNTER — Ambulatory Visit: Admitting: Internal Medicine

## 2024-07-04 ENCOUNTER — Encounter: Payer: Self-pay | Admitting: Internal Medicine

## 2024-07-04 VITALS — BP 120/70 | HR 81 | Temp 97.8°F | Resp 18 | Ht 68.0 in | Wt 177.4 lb

## 2024-07-04 DIAGNOSIS — M609 Myositis, unspecified: Secondary | ICD-10-CM

## 2024-07-04 DIAGNOSIS — Z6826 Body mass index (BMI) 26.0-26.9, adult: Secondary | ICD-10-CM | POA: Insufficient documentation

## 2024-07-04 DIAGNOSIS — I10 Essential (primary) hypertension: Secondary | ICD-10-CM | POA: Diagnosis not present

## 2024-07-04 DIAGNOSIS — Z853 Personal history of malignant neoplasm of breast: Secondary | ICD-10-CM

## 2024-07-04 DIAGNOSIS — E039 Hypothyroidism, unspecified: Secondary | ICD-10-CM

## 2024-07-04 DIAGNOSIS — I2510 Atherosclerotic heart disease of native coronary artery without angina pectoris: Secondary | ICD-10-CM | POA: Diagnosis not present

## 2024-07-04 DIAGNOSIS — I679 Cerebrovascular disease, unspecified: Secondary | ICD-10-CM

## 2024-07-04 DIAGNOSIS — E782 Mixed hyperlipidemia: Secondary | ICD-10-CM | POA: Diagnosis not present

## 2024-07-04 DIAGNOSIS — Z8673 Personal history of transient ischemic attack (TIA), and cerebral infarction without residual deficits: Secondary | ICD-10-CM

## 2024-07-04 DIAGNOSIS — F411 Generalized anxiety disorder: Secondary | ICD-10-CM

## 2024-07-04 DIAGNOSIS — Z0000 Encounter for general adult medical examination without abnormal findings: Secondary | ICD-10-CM | POA: Diagnosis not present

## 2024-07-04 DIAGNOSIS — M810 Age-related osteoporosis without current pathological fracture: Secondary | ICD-10-CM | POA: Diagnosis not present

## 2024-07-04 DIAGNOSIS — R442 Other hallucinations: Secondary | ICD-10-CM | POA: Insufficient documentation

## 2024-07-04 DIAGNOSIS — T466X5A Adverse effect of antihyperlipidemic and antiarteriosclerotic drugs, initial encounter: Secondary | ICD-10-CM

## 2024-07-04 DIAGNOSIS — F330 Major depressive disorder, recurrent, mild: Secondary | ICD-10-CM | POA: Insufficient documentation

## 2024-07-04 DIAGNOSIS — R7303 Prediabetes: Secondary | ICD-10-CM | POA: Diagnosis not present

## 2024-07-04 DIAGNOSIS — Z789 Other specified health status: Secondary | ICD-10-CM

## 2024-07-04 DIAGNOSIS — J301 Allergic rhinitis due to pollen: Secondary | ICD-10-CM | POA: Diagnosis not present

## 2024-07-04 DIAGNOSIS — G4733 Obstructive sleep apnea (adult) (pediatric): Secondary | ICD-10-CM

## 2024-07-04 MED ORDER — CAPVAXIVE 0.5 ML IM SOSY: 0.5000 mL | PREFILLED_SYRINGE | INTRAMUSCULAR | 0 refills | Status: AC

## 2024-07-04 MED ORDER — FLUTICASONE PROPIONATE 50 MCG/ACT NA SUSP: 1.0000 | Freq: Two times a day (BID) | NASAL | 3 refills | Status: DC

## 2024-07-04 MED ORDER — SHINGRIX 50 MCG/0.5ML IM SUSR: 0.5000 mL | Freq: Once | INTRAMUSCULAR | 0 refills | Status: AC

## 2024-07-04 NOTE — Assessment & Plan Note (Signed)
 Plan as above.

## 2024-07-04 NOTE — Assessment & Plan Note (Signed)
 Her BP is controlled on losartan  at this time.

## 2024-07-04 NOTE — Progress Notes (Addendum)
 Preventive Screening-Counseling & Management     Haley Beasley is a 68 y.o. female who presents for Medicare Annual/Subsequent preventive examination.  Her last eye exam was one year ago and she states there is no problems with her vision.  Her last colonoscopy was done on 01/14/2022 and this showed one polyp in the rectum.  There was diverticulosis.  Her pathology was normal.  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  Esophagus was otherwise normal.  She had dysphagias and her esophagus was dilated.  There were a few sessile polyps of the stomach.  The patient remains on protonix  and she denies any problems with reflux or dysphagia.  She has a history of breast cancer (see below).  She normally does a breast MRI but has not done this in years.  The patient had a total hystrectomy in 2000.  She does walk for exercise.  She denies any problems with urination.  She quit smoking in 2009.  The patient does not get yearly flu vaccines.  She had one pneumonia vaccine at age 89.  She states she had a zostavax vaccine but no shingles vaccine.  The patient has had 3 COVID-19 vaccines.  The patient denies any depression, anxiety or memory loss.  She is on ASA 81mg  daily.  Haley Beasley is a 68 y.o. 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  Her last visit with Hem/Onch was on 06/03/2024 and they felt her breast cancer is in remission.  He has now released her from regular visits with Hem/Onc.  Haley Beasley is a 68 yo female who returns today for followup on her cerebrovascular disease where she was admitted to [facility] in 09/2023 with new onset stroke.  She presented to the ER with sudden onset of left-sided numbness and weakness that started suddenly at 9 AM.  She woke up normal that morning and was going about her usual morning which was when she had a sudden onset of left-sided weakness.  She had an initial head CT which was negative.  They did a CTA of the head and neck that showed no significant proximal stenosis, aneurysm, or branch vessel occlusion within the Circle of Willis.   There was atherosclerotic changes at the carotid bifurcations bilaterally without significant stenosis.  There were metallic densities within the thyroid  bed. Minimal residual thyroid  tissue is present on the right and aortic atherosclerosis.  A MRI was performed on 10/04/2023 which demonstrated no acute intracranial abnormality.  There was moderate for age cerebral white matter signal changes, nonspecific but most commonly due to chronic small vessel disease.  However, she was seen by the stroke team and they had clinical suspicion that she had a stroke.  She was treated with TNK, and they re-reviewed her MRI of her brain as neurology felt clinicaly she had residual deficits that were suspicious for a thalamic stroke.  There was a subtle asymmetric diffusion at the ventral right thalamus seen on the MRI.  Ultimately, they felt she had an acute ischemic infarct with punctate infarct in the anterior right thalamus s/p TNK with etilogy small vessel disease in  the setting of uncontrolled risk factors .  She had left hemiparesis with left-sided sensory loss.  She was admitted to the ICU and her BP was tightly controlled.  An ECHO was done on 10/03/2023 and this showed a normal LVEF of 60-65%.  Left ventricular diastolic function could not be evaluated. Her right ventricular systolic function was normal.  There was neurological improvement where she presented with deficits with mild left side facial droop and some blurred vision with left side  weakness.  This improved overnight with the numbness resolving and her strength mostly returned.  She still has some weakness only involving her left hip and left foot.  She was discharged where she saw outpatient PT.  Follow-up brain imaging confirmed tiny punctate right thalamic infarct with no hemorrhage.  Patient was evaluated by therapist and felt safe to be discharged home.  Patient was counseled to be compliant with taking her Repatha  as she had missed the last 2 doses.  She was sent home on ASA 81mg  daily and plavix  75mg  daily (x3 weeks).  Her LDL at that time was 139 and her HgBA1c was 5.9%.  She did see her neurologist in 11/2023 where they added zetia  to her cholesterol regimen.  The patient returns for followup of her cholesterol.  She unfortunately had a stroke where she was hospitalized at [facility] in 09/2023.  Her goal LDL <70.  She was sent home on ASA 81mg  daily and plavix  75mg  daily (x3 weeks).  Her LDL at that time was 139 in the hospital.   The patient does have an allergy to statins where she has been on lipitor, zocor and crestor and they all caused myalgias.  She is currently followed by the [facility] with her last visit in 06/2022. Overall, she states she is doing well and is without any complaints or problems at this time. She specifically denies chest pain, abdominal pain, nausea, diarrhea, fatigue or myalgias. She remains on dietary management as well as the following cholesterol lowering medications: Repatha  140mg  twice a month and zetia  10mg  daily.  She is intolerant to statins simvastatin, crestor, atorvastatin , pravastatin which all caused myalgias.  The patient is currently fasting for her labs today.   The patient is a 68 year old female who presents for a follow-up evaluation of hypertension.  She was diagnosed with HTN in 2020.  Since her last visit, she has not had any problems.  The patient has been checking her blood pressure at home.  She states her systolic BP is  in the 120's.  The patient's current medications include: losartan  25 mg daily. The patient has been tolerating her medications well. The patient denies any visual changes, dizziness, headaches, chest pain, lightheadness, palpitations, generalized weakness or edema. She reports there have been no other symptoms noted.     The patient also returns for followup of her prediabetes.  She was noted to be prediabetic on yearly labs in 04/2023 where her HgBA1c was incidentally found to be 5.9%.  She is currently not on any medications and I asked her to control this with diet and exercise.  Today, she denies any abdominal pain, nausea, vomiting or other problems.  She does not routinely check her FSBS.  Her last HgBA1c was done in 09/2023 and was 5.9%.   The patient is a 68 year old female , post-menopausal, who has a history of osteoporosis. She tells me that her last bone density in [place]  showed she had osteopenia. We did  a bone density on 09/04/2022 which showed a lumbar spine t-score of -2.6 where she was diagnosed with osteoporosis. I asked her to start on aldendronate 70mg  weekly which she states her reflux has worsened. We therefore placed her on iv reclast . She has no specific complaints related to bone loss.  She has had an ankle fracture in 01/2023. She reports a family history of osteoporosis. The following risk factors are noted: history of tobacco abuse (quit in 2009), She denies the following: diabetes mellitus, high caffeine intake, alcohol consumption of more that 7 ounces per week, daily prednisone  use, hyperthyroidism, surgical resection of her bowel, and surgical resection of her stomach. She states she exercises routinely with walking and biking. I also asked her to start on calcium  and Vit D but the patient states she gets severe constipation from calcium  and even VIt D supplementation. I did discuss medications with her and I wanted her to stop the alendronate  as it is causing worsening reflux. We  discussed prolia vs. Reclast  and possible side effects. We set her up for iv reclast  with her first dose done on 10/29/2022 and her 2nd dose done on 02/01/2024.  She will go for her last dose of IV reclast  next year.  The patient is a 68 year old female who returns for a regularly scheduled thyroid  check.  She was diagnosed with hypothyroidism around 1997.   She had a known goiter at that time with abnormal TFT's and she had her thyroid  removed in 1999.  She has been on replacement thyroid  since then.  Since the last visit in 07/2022,  there has been no overall change in her status. She remains on levothyroxine  75mcg po daily.  She claims to have no symptoms suggestive of thyroid  imbalance specifically denying fatigue, cold intolerance, heat intolerance, tremors, anxiety, unexplained weight changes, insomnia,but she has fluctuations of diarrhea and constipation and she does complain of dry skin.   The patient also presents history of anxiety, depression and PTSD. She has followed with [facility] in the past. Symptoms are in remission at this time.  She reports no additional symptoms.  She is currently on sertraline  25mg  daily and she uses xanax  prn maybe 2-3 times per week.  The patient denies any recent panic attacks.  She denies feelings of worthlessness, helpless feeling, suicidal ideation, homicidal ideation, weight gain, weight loss, and out of control feelings. This patient feels that she is able to care for herself. Predisposing factors include: life style stress of multiple roles. She denies a recent life crisis.   The patient also has a history of OSA.  She has seen pulmonary medicine for this and his currently on CPAP and she is compliant with this.        Are there smokers in your home (other than you)? No  Risk Factors Current exercise habits: as above  Dietary issues discussed: none   Depression Screen (Note: if answer to either of the following is Yes, a more complete depression screening  is indicated)   Over the past two weeks, have you felt down, depressed or hopeless? No  Over the past two weeks, have you felt little interest or pleasure in doing things? No  Have you lost interest or pleasure in daily life? No  Do you often feel hopeless? No  Do you cry easily over simple problems? No  Activities of Daily Living In your present state of health, do you have any difficulty performing the following activities?:  Driving? No Managing money?  No Feeding yourself? No Getting from bed to chair? No Climbing a flight of stairs? No Preparing food and eating?: No Bathing or showering? No Getting dressed: No Getting to the toilet? No Using the toilet:No Moving around from place to place: No In the past year have you fallen or had a near fall?:No   Hearing Difficulties: No Do you often ask people to speak up or repeat themselves? No Do you experience ringing or noises in your ears? No Do you have difficulty understanding soft or whispered voices? No   Do you feel that you have a problem with memory? No  Do you often misplace items? No  Do you feel safe at home?  Yes  Cognitive Testing  Alert? Yes  Normal Appearance?Yes  Oriented to person? Yes  Place? Yes   Time? Yes  Recall of three objects?  Yes  Can perform simple calculations? Yes  Displays appropriate judgment?Yes  Can read the correct time from a watch face?Yes  Fall Risk Prevention  Any stairs in or around the home? No  If so, are there any without handrails? No  Home free of loose throw rugs in walkways, pet beds, electrical cords, etc? Yes  Adequate lighting in your home to reduce risk of falls? Yes  Use of a cane, walker or w/c? No    Time Up and Go  Was the test performed? Yes .  Length of time to ambulate 10 feet: 7.2 sec.   Gait steady and fast without use of assistive device    Advanced Directives have been discussed with the patient? Yes   List the Names of Other  Physician/Practitioners you currently use: Patient Care Team: Fleeta Valeria Mayo, MD as PCP - General (Internal Medicine)    Past Medical History:  Diagnosis Date   Allergy 2000   Anxiety    Breast cancer [facility])    Cataract 2011   Depression    GERD (gastroesophageal reflux disease)    Hyperlipidemia    Hypertension    Hypothyroidism    OSA (obstructive sleep apnea)    Osteoporosis 2009   Sleep apnea 2015   Stroke (HCC) 10/03/2023    Past Surgical History:  Procedure Laterality Date   ABDOMINAL HYSTERECTOMY     ANKLE FRACTURE SURGERY Right 01/2023   BREAST BIOPSY Left    BREAST RECONSTRUCTION     CATARACT EXTRACTION Bilateral    CHOLECYSTECTOMY     COLONOSCOPY W/ POLYPECTOMY     ENDOVENOUS ABLATION SAPHENOUS VEIN W/ LASER Right 01/2020   [place]    ENDOVENOUS ABLATION SAPHENOUS VEIN W/ LASER Left 05/2020   [place]    EYE SURGERY  2012   Cataract   FRACTURE SURGERY  01/2023   LYMPH NODE BIOPSY Left    AXILLARY   MASTECTOMY Bilateral    THYROIDECTOMY     TUBAL LIGATION Bilateral    WISDOM TOOTH EXTRACTION        Current Medications  Current Outpatient Medications  Medication Sig Dispense Refill   ALPRAZolam  (XANAX ) 0.5 MG tablet Take 1 tablet (0.5 mg total) by mouth daily as needed for sleep or anxiety. 30 tablet 2   aspirin  EC 81 MG tablet Take 1 tablet (81 mg total) by mouth daily. Swallow whole. 30 tablet 12   Evolocumab  (REPATHA  SURECLICK) 140 MG/ML SOAJ Inject 140 mg into the skin every 14 (fourteen) days. 6 mL 3   ezetimibe  (ZETIA ) 10 MG tablet TAKE 1 TABLET(10 MG) BY MOUTH DAILY 30 tablet 2   losartan  (  COZAAR ) 25 MG tablet TAKE 1 TABLET(25 MG) BY MOUTH DAILY 90 tablet 1   pantoprazole  (PROTONIX ) 40 MG tablet TAKE 1 TABLET(40 MG) BY MOUTH DAILY 90 tablet 0   pneumococcal 21-valent conjugate vaccine (CAPVAXIVE) 0.5 ML injection Inject 0.5 mLs into the muscle tomorrow at 10 am for 1 dose. 0.5 mL 0   SYNTHROID  75 MCG tablet TAKE 1 TABLET(75 MCG) BY MOUTH DAILY 60  tablet 2   Zoster Vaccine Adjuvanted [facility]) injection Inject 0.5 mLs into the muscle once for 1 dose. 0.5 mL 0   fluticasone  (FLONASE ) 50 MCG/ACT nasal spray Place 1 spray into both nostrils in the morning and at bedtime. 16 g 3   sertraline  (ZOLOFT ) 25 MG tablet Take 1 tablet (25 mg total) by mouth daily. 90 tablet 3   triamcinolone  ointment (KENALOG ) 0.1 % use BID for 2 weeks on affected areas, then break for 2 weeks. Repeat PRN. 80 g 5   zaleplon (SONATA) 10 MG capsule Take 10 mg by mouth at bedtime.     No current facility-administered medications for this visit.    Allergies Fluogen [influenza virus vaccine] and Penicillins   Social History Social History   Tobacco Use   Smoking status: Former    Current packs/day: 0.00    Types: Cigarettes    Quit date: 09/23/2007    Years since quitting: 16.7   Smokeless tobacco: Never  Substance Use Topics   Alcohol use: Yes    Comment: OCCASIONAL     Review of Systems Review of Systems  Constitutional:  Negative for chills, fever, malaise/fatigue and weight loss.  Eyes:  Negative for blurred vision and double vision.  Respiratory:  Negative for cough, hemoptysis, shortness of breath and wheezing.   Cardiovascular:  Negative for chest pain, palpitations and leg swelling.  Gastrointestinal:  Negative for abdominal pain, blood in stool, constipation, diarrhea, heartburn, melena, nausea and vomiting.  Genitourinary:  Negative for frequency and hematuria.  Musculoskeletal:  Negative for myalgias.  Skin:  Negative for itching and rash.  Neurological:  Negative for dizziness, weakness and headaches.  Endo/Heme/Allergies:  Negative for polydipsia.     Physical Exam:      Body mass index is 26.97 kg/m. BP 120/70   Pulse 81   Temp 97.8 F (36.6 C)   Resp 18   Ht 5' 8 (1.727 m)   Wt 177 lb 6 oz (80.5 kg)   SpO2 99%   BMI 26.97 kg/m   Physical Exam Constitutional:      Appearance: Normal appearance. She is not  ill-appearing.  HENT:     Head: Normocephalic and atraumatic.     Right Ear: Tympanic membrane, ear canal and external ear normal.     Left Ear: Tympanic membrane, ear canal and external ear normal.     Nose: Nose normal. No congestion or rhinorrhea.     Mouth/Throat:     Mouth: Mucous membranes are moist.     Pharynx: Oropharynx is clear. No posterior oropharyngeal erythema.  Eyes:     General: No scleral icterus.    Conjunctiva/sclera: Conjunctivae normal.     Pupils: Pupils are equal, round, and reactive to light.  Neck:     Thyroid : No thyromegaly.     Vascular: No carotid bruit.  Cardiovascular:     Rate and Rhythm: Normal rate and regular rhythm.     Pulses: Normal pulses.     Heart sounds: Normal heart sounds. No murmur heard.    No friction rub.  No gallop.  Pulmonary:     Effort: Pulmonary effort is normal. No respiratory distress.     Breath sounds: Normal breath sounds. No wheezing, rhonchi or rales.  Abdominal:     General: Abdomen is flat. Bowel sounds are normal. There is no distension.     Palpations: Abdomen is soft.     Tenderness: There is no abdominal tenderness.  Musculoskeletal:     Cervical back: Normal range of motion. No tenderness.     Right lower leg: No edema.     Left lower leg: No edema.     Comments: No clubbing or cyanosis  Lymphadenopathy:     Cervical: No cervical adenopathy.  Skin:    General: Skin is warm and dry.     Findings: No rash.  Neurological:     General: No focal deficit present.     Mental Status: She is alert and oriented to person, place, and time.     Comments: CN II-XII grossly intact  Psychiatric:        Mood and Affect: Mood normal.        Behavior: Behavior normal.      Assessment:      Cerebrovascular disease - Plan: CBC with Differential/Platelet, CMP14 + Anion Gap  History of stroke - Plan: CBC with Differential/Platelet, CMP14 + Anion Gap  Statin intolerance  Statin-induced myositis  Mixed  hyperlipidemia - Plan: CBC with Differential/Platelet, CMP14 + Anion Gap, Lipid panel  Essential hypertension - Plan: CBC with Differential/Platelet, CMP14 + Anion Gap  Prediabetes - Plan: Hemoglobin A1c  Age-related osteoporosis without current pathological fracture  Coronary artery calcification seen on CT scan  Hypothyroidism (acquired) - Plan: TSH, T4, free  OSA (obstructive sleep apnea)  GAD (generalized anxiety disorder)  History of breast cancer - Plan: MR BREAST BILATERAL W WO CONTRAST INC CAD  Mild episode of recurrent major depressive disorder  BMI 26.0-26.9,adult  Phantosmia    Plan:     During the course of the visit the patient was educated and counseled about appropriate screening and preventive services including:   Pneumococcal vaccine  Influenza vaccine Screening mammography Bone densitometry screening Colorectal cancer screening Advanced directives: discussed  Diet review for nutrition referral? Yes ____  Not Indicated __X__   Patient Instructions (the written plan) was given to the patient.  Cerebrovascular disease She had an initial stroke in 09/2023 where she is being followed by neurology.  We will continue with secondary prevention.  She is on ASA daily and statin.  She has no residual sequalae from her stroke.  Coronary artery calcification seen on CT scan Continue with her statin and ASA.  Essential hypertension Her BP is controlled on losartan  at this time.  OSA (obstructive sleep apnea) She remains compliant with her CPAP.  Hypothyroidism (acquired) We will check her TFT's today and continue on levothyroxine .  Age-related osteoporosis without current pathological fracture She will need another iv reclast  next year. She was not able to tolerate calcium  and Vit D.  I want her to continue on weight bearing exercises.  Statin-induced myositis Plan as below.  BMI 26.0-26.9,adult I want her to eat healthy, exercise and continue to  lose weight.  GAD (generalized anxiety disorder) Her depression and anxiety are currently controlled.  We will continue on sertraline .  History of breast cancer We will set her up for a MR of her breasts.  History of stroke Plan as above.  Hyperlipidemia We will check her FLP with goal LDL <70.  Continue on repatha  and zetia .  Mild episode of recurrent major depressive disorder Plan as above.  Prediabetes She controls this with diet and exercise.  We will check a HgBa1c today.  Statin intolerance Plan as above.  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  She wants referral to ENT.   Prevention Health maintenance discussed.  She needs a PCV21 vaccine and her breast MR.  We will obtain some yearly labs.  Medicare Attestation I have personally reviewed: The patient's medical and social history Their use of alcohol, tobacco or illicit drugs Their current medications and supplements The patient's functional ability including ADLs,fall risks, home safety risks, cognitive, and hearing and visual impairment Diet and physical activities Evidence for depression or mood disorders  The patient's weight, height, and BMI have been recorded in the chart.  I have made referrals, counseling, and provided education to the patient based on review of the above and I have provided the patient with a written personalized care plan for preventive services.     Selinda Fleeta Finger, MD   07/04/2024

## 2024-07-04 NOTE — Assessment & Plan Note (Signed)
 She remains compliant with her CPAP.

## 2024-07-04 NOTE — Assessment & Plan Note (Signed)
 We will check her TFT's today and continue on levothyroxine .

## 2024-07-04 NOTE — Addendum Note (Signed)
 Addended by: VAN EYK, Sander Remedios on: 07/04/2024 03:53 PM   Modules accepted: Orders

## 2024-07-04 NOTE — Assessment & Plan Note (Signed)
 We will check her FLP with goal LDL <70.  Continue on repatha  and zetia .

## 2024-07-04 NOTE — Assessment & Plan Note (Signed)
 She controls this with diet and exercise.  We will check a HgBa1c today.

## 2024-07-04 NOTE — Assessment & Plan Note (Signed)
 I want her to eat healthy, exercise and continue to lose weight.

## 2024-07-04 NOTE — Assessment & Plan Note (Signed)
 Continue with her statin and ASA.

## 2024-07-04 NOTE — Assessment & Plan Note (Signed)
 We will set her up for a MR of her breasts.

## 2024-07-04 NOTE — Assessment & Plan Note (Signed)
 She has a burnt smell smell for the last 2 months.  She wants referral to ENT.

## 2024-07-04 NOTE — Assessment & Plan Note (Signed)
 Her depression and anxiety are currently controlled.  We will continue on sertraline .

## 2024-07-04 NOTE — Assessment & Plan Note (Signed)
 Plan as below.

## 2024-07-04 NOTE — Assessment & Plan Note (Signed)
 She had an initial stroke in 09/2023 where she is being followed by neurology.  We will continue with secondary prevention.  She is on ASA daily and statin.  She has no residual sequalae from her stroke.

## 2024-07-04 NOTE — Assessment & Plan Note (Signed)
 She will need another iv reclast  next year. She was not able to tolerate calcium  and Vit D.  I want her to continue on weight bearing exercises.

## 2024-07-05 ENCOUNTER — Other Ambulatory Visit: Payer: Self-pay | Admitting: Internal Medicine

## 2024-07-05 ENCOUNTER — Other Ambulatory Visit: Payer: Self-pay | Admitting: Neurology

## 2024-07-05 LAB — CMP14 + ANION GAP
ALT: 13 IU/L (ref 0–32)
AST: 19 IU/L (ref 0–40)
Albumin: 4.4 g/dL (ref 3.9–4.9)
Alkaline Phosphatase: 64 IU/L (ref 49–135)
Anion Gap: 12 mmol/L (ref 10.0–18.0)
BUN/Creatinine Ratio: 21 (ref 12–28)
BUN: 15 mg/dL (ref 8–27)
Bilirubin Total: <0.2 mg/dL (ref 0.0–1.2)
CO2: 25 mmol/L (ref 20–29)
Calcium: 9.3 mg/dL (ref 8.7–10.3)
Chloride: 102 mmol/L (ref 96–106)
Creatinine, Ser: 0.7 mg/dL (ref 0.57–1.00)
Globulin, Total: 2.9 g/dL (ref 1.5–4.5)
Glucose: 112 mg/dL — ABNORMAL HIGH (ref 70–99)
Potassium: 4.2 mmol/L (ref 3.5–5.2)
Sodium: 139 mmol/L (ref 134–144)
Total Protein: 7.3 g/dL (ref 6.0–8.5)
eGFR: 94 mL/min/1.73 (ref >59)

## 2024-07-05 LAB — LIPID PANEL
Chol/HDL Ratio: 2.2 ratio (ref 0.0–4.4)
Cholesterol, Total: 148 mg/dL (ref 100–199)
HDL: 68 mg/dL (ref >39)
LDL Chol Calc (NIH): 49 mg/dL (ref 0–99)
Triglycerides: 196 mg/dL — ABNORMAL HIGH (ref 0–149)
VLDL Cholesterol Cal: 31 mg/dL (ref 5–40)

## 2024-07-05 LAB — CBC WITH DIFFERENTIAL/PLATELET
Basophils Absolute: 0.1 x10E3/uL (ref 0.0–0.2)
Basos: 1 %
EOS (ABSOLUTE): 0.3 x10E3/uL (ref 0.0–0.4)
Eos: 4 %
Hematocrit: 38.8 % (ref 34.0–46.6)
Hemoglobin: 12.6 g/dL (ref 11.1–15.9)
Immature Grans (Abs): 0 x10E3/uL (ref 0.0–0.1)
Immature Granulocytes: 0 %
Lymphocytes Absolute: 2.2 x10E3/uL (ref 0.7–3.1)
Lymphs: 27 %
MCH: 29.8 pg (ref 26.6–33.0)
MCHC: 32.5 g/dL (ref 31.5–35.7)
MCV: 92 fL (ref 79–97)
Monocytes Absolute: 0.6 x10E3/uL (ref 0.1–0.9)
Monocytes: 7 %
Neutrophils Absolute: 5 x10E3/uL (ref 1.4–7.0)
Neutrophils: 61 %
Platelets: 304 x10E3/uL (ref 150–450)
RBC: 4.23 x10E6/uL (ref 3.77–5.28)
RDW: 13.2 % (ref 11.7–15.4)
WBC: 8.2 x10E3/uL (ref 3.4–10.8)

## 2024-07-05 LAB — T4, FREE: Free T4: 1.44 ng/dL (ref 0.82–1.77)

## 2024-07-05 LAB — TSH: TSH: 1.63 u[IU]/mL (ref 0.450–4.500)

## 2024-07-05 LAB — HEMOGLOBIN A1C
Est. average glucose Bld gHb Est-mCnc: 131 mg/dL
Hgb A1c MFr Bld: 6.2 % — ABNORMAL HIGH (ref 4.8–5.6)

## 2024-07-06 ENCOUNTER — Encounter: Payer: Self-pay | Admitting: Adult Health

## 2024-07-06 ENCOUNTER — Telehealth: Payer: Self-pay

## 2024-07-06 ENCOUNTER — Ambulatory Visit: Admitting: Adult Health

## 2024-07-06 VITALS — BP 122/84 | HR 56 | Ht 68.0 in | Wt 177.0 lb

## 2024-07-06 DIAGNOSIS — H5319 Other subjective visual disturbances: Secondary | ICD-10-CM | POA: Diagnosis not present

## 2024-07-06 DIAGNOSIS — I6381 Other cerebral infarction due to occlusion or stenosis of small artery: Secondary | ICD-10-CM

## 2024-07-06 DIAGNOSIS — H04213 Epiphora due to excess lacrimation, bilateral lacrimal glands: Secondary | ICD-10-CM | POA: Diagnosis not present

## 2024-07-06 DIAGNOSIS — Z961 Presence of intraocular lens: Secondary | ICD-10-CM | POA: Diagnosis not present

## 2024-07-06 DIAGNOSIS — H43812 Vitreous degeneration, left eye: Secondary | ICD-10-CM | POA: Diagnosis not present

## 2024-07-06 DIAGNOSIS — I679 Cerebrovascular disease, unspecified: Secondary | ICD-10-CM | POA: Diagnosis not present

## 2024-07-06 DIAGNOSIS — H43393 Other vitreous opacities, bilateral: Secondary | ICD-10-CM | POA: Diagnosis not present

## 2024-07-06 NOTE — Patient Instructions (Addendum)
 Continue aspirin  81 mg daily, Zetia  and Repatha   for secondary stroke prevention  Referral placed to ophthalmology for further evaluation of your vision concerns   Continue to follow up with PCP regarding cholesterol and blood pressure management  Maintain strict control of hypertension with blood pressure goal below 130/90 and cholesterol with LDL cholesterol (bad cholesterol) goal below 70 mg/dL.   Signs of a Stroke? Follow the BEFAST method:  Balance Watch for a sudden loss of balance, trouble with coordination or vertigo Eyes Is there a sudden loss of vision in one or both eyes? Or double vision?  Face: Ask the person to smile. Does one side of the face droop or is it numb?  Arms: Ask the person to raise both arms. Does one arm drift downward? Is there weakness or numbness of a leg? Speech: Ask the person to repeat a simple phrase. Does the speech sound slurred/strange? Is the person confused ? Time: If you observe any of these signs, call 911.        Thank you for coming to see us  at [facility]. I hope we have been able to provide you high quality care today.  You may receive a patient satisfaction survey over the next few weeks. We would appreciate your feedback and comments so that we may continue to improve ourselves and the health of our patients.

## 2024-07-06 NOTE — Progress Notes (Signed)
 [facility] [address] [place]. [place] [address] [phone]       OFFICE FOLLOW-UP NOTE  Haley Beasley Date of Birth:  1956-04-25 Medical Record Number:  [id number]    Primary neurologist: Dr. Rosemarie Reason for visit: Stroke follow-up  Chief Complaint  Patient presents with   Transient Ischemic Attack    Rm 8 with spouse Pt is well and stable, reports no new stroke concerns.        HPI:   Update 07/06/2024 JM: Patient returns for follow-up visit.  Overall stable from stroke standpoint.  Does report occasional difficulty with word recall and occasional sensation of imbalance but overall gradually recovering.  She does note seeing a bright light in her left eye before opening her eyes in the morning, this was occurring a few weeks prior to her stroke and restarted occurring a couple months ago. Occurs intermittently. Denies headaches or hx of migraines. Denies blurred vision, loss of vision or diplopia.  She was seen by Dr. Jacques with normal exam per patient and was advised to follow-up with general ophthalmology but never received this referral. Reports compliance on aspirin , Zetia  and Repatha  without side effects.  Routinely follows with PCP for stroke risk factor management.  She does report nightly use of CPAP.  No further questions or concerns at this time.      Initial visit 12/21/2023 Dr. Rosemarie: Haley Beasley is a 68 year old pleasant lady seen today for initial office follow-up visit following hospital consultation for stroke in January 2025.  She is accompanied by her husband.  History is obtained from them and review of electronic medical records.  I personally reviewed pertinent available imaging films in PACS. She  has a past medical history of Anxiety, Breast cancer (HCC), Depression, GERD (gastroesophageal reflux disease), Hyperlipidemia, Hypertension, Hypothyroidism, Memory loss, and OSA (obstructive sleep apnea).  She presented on 10/03/2023  with sudden onset of left-sided weakness.  This started suddenly at 9 AM.  She woke up normal that day.  She was brought in by [facility] for emergent evaluation as a code stroke.  She had left hemiparesis on exam with NIH stroke scale of 4.  She met criteria for thrombolysis and was given TNK after discussion of risk benefits and alternatives.  She was monitored in the ICU and did well and showed significant improvement.  CT head showed no acute abnormality MRI scan was initially read as being negative but upon further review small punctate acute infarct is noted in the anterior inferior right thalamus.  CT angiogram showed no large vessel stenosis or occlusion.  Echocardiogram showed ejection fraction 60 to 65%.  Left atrial size was normal.  LDL cholesterol was elevated at 139 mg percent and hemoglobin A1c was normal at 5.9.  She was started on dual antiplatelet therapy aspirin  Plavix  for 3 weeks followed by aspirin  alone.  Patient had missed several doses of Repatha  at home and she was counseled to continue it.  Patient says she has done well since discharge.  Left-sided weakness has improved.  She is tolerating aspirin  well does get occasional bruising.  She is lost some weight.  She had trouble tolerating statins and has been taking Repatha  now regularly.  She complains of occasional feeling of imbalance particularly when she makes sudden movement.  She had some left foot issues and underwent recent surgery for that which went well.  She has no complaints today.   ROS:   14 system review of systems is positive  for those listed in HPI and all other systems negative  PMH:  Past Medical History:  Diagnosis Date   Allergy 2000   Anxiety    Breast cancer (HCC)    Cataract 2011   Depression    GERD (gastroesophageal reflux disease)    Hyperlipidemia    Hypertension    Hypothyroidism    OSA (obstructive sleep apnea)    Osteoporosis 2009   Sleep apnea 2015   Stroke (HCC) 10/03/2023    Social  History:  Social History   Socioeconomic History   Marital status: Married    Spouse name: Haley Beasley   Number of children: 2   Years of education: 12   Highest education level: Not on file  Occupational History   Occupation: RETIRED INSURANCE AGENT  Tobacco Use   Smoking status: Former    Current packs/day: 0.00    Types: Cigarettes    Quit date: 09/23/2007    Years since quitting: 16.7   Smokeless tobacco: Never  Vaping Use   Vaping status: Never Used  Substance and Sexual Activity   Alcohol use: Yes    Comment: OCCASIONAL   Drug use: Not Currently   Sexual activity: Yes  Other Topics Concern   Not on file  Social History Narrative   Lives with husband   Social Drivers of Health   Financial Resource Strain: Not on file  Food Insecurity: No Food Insecurity (10/03/2023)   Hunger Vital Sign    Worried About Running Out of Food in the Last Year: Never true    Ran Out of Food in the Last Year: Never true  Transportation Needs: No Transportation Needs (10/03/2023)   PRAPARE - Administrator, Civil Service (Medical): No    Lack of Transportation (Non-Medical): No  Physical Activity: Not on file  Stress: Not on file  Social Connections: Moderately Integrated (10/03/2023)   Social Connection and Isolation Panel    Frequency of Communication with Friends and Family: Three times a week    Frequency of Social Gatherings with Friends and Family: Once a week    Attends Religious Services: 1 to 4 times per year    Active Member of [employer] or Organizations: No    Attends Banker Meetings: Never    Marital Status: Married  Catering manager Violence: Not At Risk (10/03/2023)   Humiliation, Afraid, Rape, and Kick questionnaire    Fear of Current or Ex-Partner: No    Emotionally Abused: No    Physically Abused: No    Sexually Abused: No    Medications:   Current Outpatient Medications on File Prior to Visit  Medication Sig Dispense Refill   ALPRAZolam  (XANAX )  0.5 MG tablet Take 1 tablet (0.5 mg total) by mouth daily as needed for sleep or anxiety. 30 tablet 2   aspirin  EC 81 MG tablet Take 1 tablet (81 mg total) by mouth daily. Swallow whole. 30 tablet 12   Evolocumab  (REPATHA  SURECLICK) 140 MG/ML SOAJ Inject 140 mg into the skin every 14 (fourteen) days. 6 mL 3   ezetimibe  (ZETIA ) 10 MG tablet TAKE 1 TABLET(10 MG) BY MOUTH DAILY 30 tablet 2   fluticasone  (FLONASE ) 50 MCG/ACT nasal spray Place 1 spray into both nostrils in the morning and at bedtime. 16 g 3   losartan  (COZAAR ) 25 MG tablet TAKE 1 TABLET(25 MG) BY MOUTH DAILY 90 tablet 1   pantoprazole  (PROTONIX ) 40 MG tablet TAKE 1 TABLET(40 MG) BY MOUTH DAILY 90 tablet 0  sertraline  (ZOLOFT ) 25 MG tablet Take 1 tablet (25 mg total) by mouth daily. 90 tablet 3   SYNTHROID  75 MCG tablet TAKE 1 TABLET(75 MCG) BY MOUTH DAILY 60 tablet 2   zaleplon (SONATA) 10 MG capsule Take 10 mg by mouth at bedtime.     No current facility-administered medications on file prior to visit.    Allergies:   Allergies  Allergen Reactions   Fluogen [Influenza Virus Vaccine]    Penicillins     Physical Exam Today's Vitals   07/06/24 0754  BP: 122/84  Pulse: (!) 56  Weight: 177 lb (80.3 kg)  Height: 5' 8 (1.727 m)   Body mass index is 26.91 kg/m.   General: well developed, well nourished, very pleasant middle-age Caucasian female, seated, in no evident distress  Neurologic Exam Mental Status: Awake and fully alert.  Occasional speech hesitancy.  Oriented to place and time. Recent and remote memory intact. Attention span, concentration and fund of knowledge appropriate. Mood and affect appropriate.  Cranial Nerves: Pupils equal, briskly reactive to light. Extraocular movements full without nystagmus. Visual fields full to confrontation. Hearing intact. Facial sensation intact. Face, tongue, palate moves normally and symmetrically.  Motor: Normal bulk and tone. Normal strength in all tested extremity  muscles. Sensory.: intact to touch ,pinprick .position and vibratory sensation.  Coordination: Rapid alternating movements normal in all extremities. Finger-to-nose and heel-to-shin performed accurately bilaterally. Gait and Station: Arises from chair without difficulty. Stance is normal. Gait demonstrates normal stride length and balance . Able to heel, toe and tandem walk with moderate difficulty.  Reflexes: 1+ and symmetric. Toes downgoing.       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  Vascular risk factors of hypertension and hyperlipidemia. Notes bright flashes of light in left eye upon awakening over the past couple of months, was present a few weeks prior to her stroke.      PLAN:  Thalamic stroke Continue aspirin  81 mg daily, Zetia  10 mg daily and Repatha  for secondary stroke prevention managed/prescribed by PCP Advise close PCP follow-up for aggressive stroke risk factor management Stroke labs 06/2024: LDL 49, A1c 6.2  OS bright flashes of light Unclear cause, low suspicion stroke related, no changes in vision Possible migraine visual aura although denies headache or hx of migraines  Evaluation by neuro-ophthalmology Dr. Jacques without concerning findings Referral placed to Dr. Octavia for further evaluation Will forward to Dr. Rosemarie for further recommendations ADDENDUM 07/07/2024: Per Dr. Rosemarie, positive visual phenomenon likely ophthalmic issue. This was discussed with patient during in office visit. No further work up needed/indicated from neurological standpoint and will continue to proceed with ophthalmology evaluation    No further recommendations from stroke standpoint and is close to being followed by PCP.  She can follow-up here on an as-needed basis but advised to call with any questions or concerns in the future     I personally  spent a total of 35 minutes in the care of the patient today including preparing to see the patient, performing a medically appropriate exam/evaluation, counseling and educating, placing orders, and documenting clinical information in the EHR.  Harlene Bogaert, AGNP-BC  [facility] [address] [place], [place] [address]  Phone [phone] Fax [phone] Note: This document was prepared with digital dictation and possible smart phrase technology. Any transcriptional errors that result from this process are unintentional.

## 2024-07-06 NOTE — Progress Notes (Signed)
 I agree with the above plan

## 2024-07-06 NOTE — Telephone Encounter (Signed)
 Referral sent to [facility] [phone]  [phone]

## 2024-07-14 ENCOUNTER — Ambulatory Visit: Admitting: Dermatology

## 2024-07-14 DIAGNOSIS — J301 Allergic rhinitis due to pollen: Secondary | ICD-10-CM | POA: Diagnosis not present

## 2024-07-14 DIAGNOSIS — G4700 Insomnia, unspecified: Secondary | ICD-10-CM | POA: Diagnosis not present

## 2024-07-14 DIAGNOSIS — Z87891 Personal history of nicotine dependence: Secondary | ICD-10-CM | POA: Diagnosis not present

## 2024-07-14 DIAGNOSIS — G4733 Obstructive sleep apnea (adult) (pediatric): Secondary | ICD-10-CM | POA: Diagnosis not present

## 2024-07-15 ENCOUNTER — Ambulatory Visit (HOSPITAL_COMMUNITY)
Admission: RE | Admit: 2024-07-15 | Discharge: 2024-07-15 | Disposition: A | Discharge status: Home / Self Care | Source: Ambulatory Visit | Attending: Internal Medicine | Admitting: Internal Medicine

## 2024-07-15 DIAGNOSIS — Z853 Personal history of malignant neoplasm of breast: Secondary | ICD-10-CM | POA: Insufficient documentation

## 2024-07-15 DIAGNOSIS — Z1239 Encounter for other screening for malignant neoplasm of breast: Secondary | ICD-10-CM | POA: Diagnosis not present

## 2024-07-15 MED ORDER — GADOBUTROL 1 MMOL/ML IV SOLN
8.0000 mL | Freq: Once | INTRAVENOUS | Status: AC | PRN
  Administered 2024-07-15: 8 mL via INTRAVENOUS

## 2024-07-21 DIAGNOSIS — I679 Cerebrovascular disease, unspecified: Secondary | ICD-10-CM | POA: Diagnosis not present

## 2024-07-21 DIAGNOSIS — Z961 Presence of intraocular lens: Secondary | ICD-10-CM | POA: Diagnosis not present

## 2024-07-21 DIAGNOSIS — H04213 Epiphora due to excess lacrimation, bilateral lacrimal glands: Secondary | ICD-10-CM | POA: Diagnosis not present

## 2024-07-21 DIAGNOSIS — H43393 Other vitreous opacities, bilateral: Secondary | ICD-10-CM | POA: Diagnosis not present

## 2024-07-21 DIAGNOSIS — H43812 Vitreous degeneration, left eye: Secondary | ICD-10-CM | POA: Diagnosis not present

## 2024-07-26 DIAGNOSIS — I10 Essential (primary) hypertension: Secondary | ICD-10-CM | POA: Diagnosis not present

## 2024-07-26 DIAGNOSIS — G4733 Obstructive sleep apnea (adult) (pediatric): Secondary | ICD-10-CM | POA: Diagnosis not present

## 2024-07-26 DIAGNOSIS — E039 Hypothyroidism, unspecified: Secondary | ICD-10-CM | POA: Diagnosis not present

## 2024-07-26 DIAGNOSIS — J343 Hypertrophy of nasal turbinates: Secondary | ICD-10-CM | POA: Diagnosis not present

## 2024-07-26 DIAGNOSIS — F419 Anxiety disorder, unspecified: Secondary | ICD-10-CM | POA: Diagnosis not present

## 2024-07-26 DIAGNOSIS — R442 Other hallucinations: Secondary | ICD-10-CM | POA: Diagnosis not present

## 2024-07-26 DIAGNOSIS — Z8673 Personal history of transient ischemic attack (TIA), and cerebral infarction without residual deficits: Secondary | ICD-10-CM | POA: Diagnosis not present

## 2024-07-27 DIAGNOSIS — R918 Other nonspecific abnormal finding of lung field: Secondary | ICD-10-CM | POA: Diagnosis not present

## 2024-07-28 DIAGNOSIS — R918 Other nonspecific abnormal finding of lung field: Secondary | ICD-10-CM | POA: Diagnosis not present

## 2024-08-02 ENCOUNTER — Ambulatory Visit: Payer: Self-pay

## 2024-08-02 NOTE — Progress Notes (Signed)
 Patient called.  Patient aware.

## 2024-08-03 DIAGNOSIS — J301 Allergic rhinitis due to pollen: Secondary | ICD-10-CM | POA: Diagnosis not present

## 2024-08-08 DIAGNOSIS — H04331 Acute lacrimal canaliculitis of right lacrimal passage: Secondary | ICD-10-CM | POA: Diagnosis not present

## 2024-08-21 ENCOUNTER — Other Ambulatory Visit: Payer: Self-pay | Admitting: Internal Medicine

## 2024-08-24 ENCOUNTER — Other Ambulatory Visit: Payer: Self-pay | Admitting: Internal Medicine

## 2024-08-24 DIAGNOSIS — E785 Hyperlipidemia, unspecified: Secondary | ICD-10-CM

## 2024-08-24 DIAGNOSIS — I2510 Atherosclerotic heart disease of native coronary artery without angina pectoris: Secondary | ICD-10-CM

## 2024-09-06 ENCOUNTER — Ambulatory Visit: Admitting: Dermatology

## 2024-09-06 ENCOUNTER — Encounter: Payer: Self-pay | Admitting: Dermatology

## 2024-09-06 VITALS — BP 105/77 | HR 65

## 2024-09-06 DIAGNOSIS — L578 Other skin changes due to chronic exposure to nonionizing radiation: Secondary | ICD-10-CM | POA: Diagnosis not present

## 2024-09-06 DIAGNOSIS — L821 Other seborrheic keratosis: Secondary | ICD-10-CM | POA: Diagnosis not present

## 2024-09-06 DIAGNOSIS — L814 Other melanin hyperpigmentation: Secondary | ICD-10-CM | POA: Diagnosis not present

## 2024-09-06 DIAGNOSIS — D1801 Hemangioma of skin and subcutaneous tissue: Secondary | ICD-10-CM

## 2024-09-06 DIAGNOSIS — L820 Inflamed seborrheic keratosis: Secondary | ICD-10-CM

## 2024-09-06 DIAGNOSIS — Z1283 Encounter for screening for malignant neoplasm of skin: Secondary | ICD-10-CM | POA: Diagnosis not present

## 2024-09-06 DIAGNOSIS — D489 Neoplasm of uncertain behavior, unspecified: Secondary | ICD-10-CM

## 2024-09-06 DIAGNOSIS — D229 Melanocytic nevi, unspecified: Secondary | ICD-10-CM

## 2024-09-06 DIAGNOSIS — W908XXA Exposure to other nonionizing radiation, initial encounter: Secondary | ICD-10-CM | POA: Diagnosis not present

## 2024-09-06 NOTE — Patient Instructions (Signed)

## 2024-09-06 NOTE — Progress Notes (Addendum)
 "  Total Body Skin Exam (TBSE) Visit  Patient (and/or pt guardian) consented to the use of AI-assisted tools for note generation.    Subjective  Haley Beasley is a 68 y.o. female who presents for the following: Skin Cancer Screening and Full Body Skin Exam  Patient was last evaluated on 03/07/24 .  Patient denies medication changes.  Patient reports she does not have spots, moles and lesions of concern to be evaluated.  Patient reports throughout her lifetime she has had moderate sun exposure.  Currently, patient reports if she has excessive sun exposure, she does apply sunscreen and/or wears protective coverings.  Patient reports she does not have hx of bx.  Patient admits to family history of skin cancers - 2 brothers unsure of cancer type. The patient has spots, moles and lesions to be evaluated, some may be new or changing and the patient has concerns that these could be cancer.  Contact Dermatitis - last evaluated on 03/07/24 At this visit patient patient was prescribed Triamcinolone  0.1% Patient reports that cream was extremely helpful and areas are no longer bothersome and has had no recent flare ups  The following portions of the chart were reviewed this encounter and updated as appropriate: medications, allergies, medical history  Review of Systems:  No other skin or systemic complaints except as noted in HPI or Assessment and Plan.  Objective  Well appearing patient in no apparent distress; mood and affect are within normal limits.  A full examination was performed including scalp, head, eyes, ears, nose, lips, neck, chest, axillae, abdomen, back, buttocks, bilateral upper extremities, bilateral lower extremities, hands, feet, fingers, toes, fingernails, and toenails. All findings within normal limits unless otherwise noted below.   Relevant physical exam findings are noted in the Assessment and Plan.   5mm left lower back para spinal pink scaly plaque Left Lower  paraspinal  Back 5 mm Pink scaly plaque   Assessment & Plan   LENTIGINES, SEBORRHEIC KERATOSES, HEMANGIOMAS - Benign normal skin lesions - Benign-appearing - Call for any changes  MELANOCYTIC NEVI - Tan-brown and/or pink-flesh-colored symmetric macules and papules - Benign appearing on exam today - Observation - Call clinic for new or changing moles - Recommend daily use of broad spectrum spf 30+ sunscreen to sun-exposed areas.   ACTINIC DAMAGE - Chronic condition, secondary to cumulative UV/sun exposure - diffuse scaly erythematous macules with underlying dyspigmentation - Recommend daily broad spectrum sunscreen SPF 30+ to sun-exposed areas, reapply every 2 hours as needed.  - Staying in the shade or wearing long sleeves, sun glasses (UVA+UVB protection) and wide brim hats (4-inch brim around the entire circumference of the hat) are also recommended for sun protection.  - Call for new or changing lesions.  SKIN CANCER SCREENING PERFORMED TODAY.  NEOPLASM OF UNCERTAIN BEHAVIOR Left Lower paraspinal  Back - Epidermal / dermal shaving  Lesion diameter (cm):  0.5 Informed consent: discussed and consent obtained   Timeout: patient name, date of birth, surgical site, and procedure verified   Procedure prep:  Patient was prepped and draped in usual sterile fashion Prep type:  Isopropyl alcohol Anesthesia: the lesion was anesthetized in a standard fashion   Anesthetic:  1% lidocaine w/ epinephrine 1-100,000 local infiltration Instrument used: DermaBlade   Hemostasis achieved with: aluminum chloride   Outcome: patient tolerated procedure well   Post-procedure details: sterile dressing applied and wound care instructions given   Dressing type: bandage and petrolatum    Specimen A - Surgical pathology Differential  Diagnosis: r/o DN  Check Margins: No SKIN EXAM FOR MALIGNANT NEOPLASM   MULTIPLE BENIGN MELANOCYTIC NEVI   CHERRY ANGIOMA   LENTIGINES   SEBORRHEIC  KERATOSIS   ACTINIC SKIN DAMAGE   Return in about 1 year (around 09/06/2025) for TBSE F/U.  Haley Beasley, am acting as a neurosurgeon for [employer], DO .   Documentation: I have reviewed the above documentation for accuracy and completeness, and I agree with the above.  Delon Lenis, DO   "

## 2024-09-07 ENCOUNTER — Ambulatory Visit: Payer: Self-pay | Admitting: Dermatology

## 2024-09-07 LAB — SURGICAL PATHOLOGY

## 2024-09-10 ENCOUNTER — Other Ambulatory Visit: Payer: Self-pay | Admitting: Internal Medicine

## 2024-09-11 ENCOUNTER — Other Ambulatory Visit: Payer: Self-pay | Admitting: Internal Medicine

## 2024-09-12 ENCOUNTER — Other Ambulatory Visit: Payer: Self-pay | Admitting: Cardiology

## 2024-09-12 DIAGNOSIS — I2510 Atherosclerotic heart disease of native coronary artery without angina pectoris: Secondary | ICD-10-CM

## 2024-09-12 DIAGNOSIS — E785 Hyperlipidemia, unspecified: Secondary | ICD-10-CM

## 2024-09-29 ENCOUNTER — Encounter: Payer: Self-pay | Admitting: Cardiology

## 2024-10-03 ENCOUNTER — Ambulatory Visit: Admitting: Internal Medicine

## 2024-10-03 ENCOUNTER — Encounter: Payer: Self-pay | Admitting: Internal Medicine

## 2024-10-03 ENCOUNTER — Ambulatory Visit: Payer: Self-pay | Admitting: Internal Medicine

## 2024-10-03 VITALS — BP 120/78 | HR 61 | Temp 97.2°F | Resp 18 | Ht 68.0 in | Wt 179.0 lb

## 2024-10-03 DIAGNOSIS — E782 Mixed hyperlipidemia: Secondary | ICD-10-CM | POA: Diagnosis not present

## 2024-10-03 DIAGNOSIS — I10 Essential (primary) hypertension: Secondary | ICD-10-CM | POA: Diagnosis not present

## 2024-10-03 DIAGNOSIS — E039 Hypothyroidism, unspecified: Secondary | ICD-10-CM | POA: Diagnosis not present

## 2024-10-03 DIAGNOSIS — F411 Generalized anxiety disorder: Secondary | ICD-10-CM

## 2024-10-03 DIAGNOSIS — M810 Age-related osteoporosis without current pathological fracture: Secondary | ICD-10-CM | POA: Diagnosis not present

## 2024-10-03 MED ORDER — PANTOPRAZOLE SODIUM 40 MG PO TBEC: 40.0000 mg | DELAYED_RELEASE_TABLET | Freq: Every day | ORAL | 2 refills | Status: AC

## 2024-10-03 MED ORDER — ALPRAZOLAM 0.5 MG PO TABS: 0.5000 mg | ORAL_TABLET | Freq: Every day | ORAL | 2 refills | Status: AC | PRN

## 2024-10-03 MED ORDER — SYNTHROID 75 MCG PO TABS: ORAL_TABLET | ORAL | 2 refills | Status: AC

## 2024-10-03 MED ORDER — FLUTICASONE PROPIONATE 50 MCG/ACT NA SUSP: 1.0000 | Freq: Two times a day (BID) | NASAL | 3 refills | Status: AC

## 2024-10-03 MED ORDER — EZETIMIBE 10 MG PO TABS: 10.0000 mg | ORAL_TABLET | Freq: Every day | ORAL | 3 refills | Status: AC

## 2024-10-03 NOTE — Assessment & Plan Note (Signed)
 She has anxiety issues and she will continually take alprazolam  and she needed refill for that.

## 2024-10-03 NOTE — Assessment & Plan Note (Signed)
 She takes Prolia injection every 6 months.  I have discussed with her doing strengthening exercise and take vitamin D  replacement.  Her BMI is 27 that is ideal weight for her.

## 2024-10-03 NOTE — Progress Notes (Signed)
 "  Office Visit  Subjective   Patient ID: LILU MCGLOWN   DOB: 10/09/1955   Age: 69 y.o.   MRN: [id number]   Chief Complaint Chief Complaint  Patient presents with   Follow-up    3 month     History of Present Illness 69 years old female who is here for follow-up.  She was seen by oncologist and biopsy was negative for any cancer.  She has a history of breast cancer.  She denies any complaint except he wanted to talk about weight he wanted to lose 10 to 15 pounds.  Her BMI is 27 and she has a history of osteoporosis and she takes Prolia injection every 6 months.  She has a history of hypertension and her blood pressure is well-controlled on current medication.  She has a history of gastroesophageal reflux disease her polyps were removed from stomach.  She also has a history of esophageal dilatation.  She takes Protonix  and need refill for that.  She has a EGD done on 01/14/2022.  Hypothyroidism and she take Synthroid  75 mcg daily and TSH in October was therapeutic.  She need refill of this medicine as well.  She has history of stroke he do not have any residual effect but she does get lightheaded sometime and she said that she understand herself when she has no fall.  She did take baby aspirin  for stroke prevention.  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  She takes ezetimibe  10 mg daily and Repatha  injection every 2 weeks.  Her lipid panel done in October 2025 shows LDL of 47 that was well-controlled.  Past Medical History Past Medical History:  Diagnosis Date   Allergy 2000   Anxiety    Breast cancer (HCC)    Cataract 2011   Depression    GERD (gastroesophageal reflux disease)    Hyperlipidemia    Hypertension    Hypothyroidism    OSA (obstructive sleep apnea)    Osteoporosis 2009   Sleep apnea 2015   Stroke (HCC) 10/03/2023     Allergies Allergies[1]   Review of Systems Review of Systems  Constitutional: Negative.   HENT:  Negative.    Respiratory: Negative.    Cardiovascular: Negative.   Gastrointestinal: Negative.   Neurological: Negative.        Objective:    Vitals BP 120/78   Pulse 61   Temp (!) 97.2 F (36.2 C)   Resp 18   Ht 5' 8 (1.727 m)   Wt 179 lb (81.2 kg)   SpO2 99%   BMI 27.22 kg/m    Physical Examination Physical Exam Constitutional:      Appearance: Normal appearance.  HENT:     Head: Normocephalic and atraumatic.  Cardiovascular:     Rate and Rhythm: Normal rate and regular rhythm.     Heart sounds: Normal heart sounds.  Pulmonary:     Effort: Pulmonary effort is normal.     Breath sounds: Normal breath sounds.  Abdominal:     General: Bowel sounds are normal.     Palpations: Abdomen is soft.  Neurological:     General: No focal deficit present.     Mental Status: She is alert and oriented to person, place, and time.        Assessment & Plan:   Essential hypertension Her blood pressure is well-controlled.  She takes losartan  25 mg daily.  Hypothyroidism (acquired) Her TSH was therapeutic in October and  I will send refill of Synthroid  75 mcg daily.  Repeat TSH on next visit.  Age-related osteoporosis without current pathological fracture She takes Prolia injection every 6 months.  I have discussed with her doing strengthening exercise and take vitamin D  replacement.  Her BMI is 27 that is ideal weight for her.  Hyperlipidemia Her LDL was target controlled control in October 25.  She will take ezetimibe  10 mg and Repatha  injection every 2 weeks.  GAD (generalized anxiety disorder) She has anxiety issues and she will continually take alprazolam  and she needed refill for that.    Return in about 3 months (around 01/01/2025).   Roetta Dare, MD      [1]  Allergies Allergen Reactions   Fluogen [Influenza Virus Vaccine]    Penicillins    "

## 2024-10-03 NOTE — Assessment & Plan Note (Signed)
 Her blood pressure is well-controlled.  She takes losartan  25 mg daily.

## 2024-10-03 NOTE — Assessment & Plan Note (Signed)
 Her TSH was therapeutic in October and I will send refill of Synthroid  75 mcg daily.  Repeat TSH on next visit.

## 2024-10-03 NOTE — Assessment & Plan Note (Signed)
 Her LDL was target controlled control in October 25.  She will take ezetimibe  10 mg and Repatha  injection every 2 weeks.

## 2024-12-30 ENCOUNTER — Ambulatory Visit: Admitting: Internal Medicine
